# Patient Record
Sex: Male | Born: 1960 | Race: Black or African American | Hispanic: No | Marital: Single | Smoking: Current every day smoker
Health system: Southern US, Community
[De-identification: ages and names within clinical notes are randomized; demographics above are authoritative.]

## PROBLEM LIST (undated history)

## (undated) DIAGNOSIS — F209 Schizophrenia, unspecified: Secondary | ICD-10-CM

## (undated) DIAGNOSIS — F419 Anxiety disorder, unspecified: Secondary | ICD-10-CM

## (undated) DIAGNOSIS — B1920 Unspecified viral hepatitis C without hepatic coma: Secondary | ICD-10-CM

## (undated) DIAGNOSIS — R569 Unspecified convulsions: Secondary | ICD-10-CM

## (undated) DIAGNOSIS — I10 Essential (primary) hypertension: Secondary | ICD-10-CM

## (undated) HISTORY — PX: ABDOMINAL SURGERY: SHX537

---

## 2004-05-05 ENCOUNTER — Emergency Department (HOSPITAL_COMMUNITY): Admission: EM | Admit: 2004-05-05 | Discharge: 2004-05-05 | Payer: Self-pay | Admitting: Emergency Medicine

## 2004-07-31 ENCOUNTER — Emergency Department (HOSPITAL_COMMUNITY): Admission: EM | Admit: 2004-07-31 | Discharge: 2004-07-31 | Payer: Self-pay | Admitting: Unknown Physician Specialty

## 2008-10-30 ENCOUNTER — Emergency Department (HOSPITAL_COMMUNITY): Admission: EM | Admit: 2008-10-30 | Discharge: 2008-10-30 | Payer: Self-pay | Admitting: Emergency Medicine

## 2008-10-31 ENCOUNTER — Other Ambulatory Visit: Payer: Self-pay | Admitting: Emergency Medicine

## 2008-10-31 ENCOUNTER — Inpatient Hospital Stay (HOSPITAL_COMMUNITY): Admission: EM | Admit: 2008-10-31 | Discharge: 2008-11-02 | Payer: Self-pay | Admitting: Psychiatry

## 2008-10-31 ENCOUNTER — Ambulatory Visit: Payer: Self-pay | Admitting: Psychiatry

## 2008-11-10 ENCOUNTER — Emergency Department (HOSPITAL_COMMUNITY): Admission: EM | Admit: 2008-11-10 | Discharge: 2008-11-10 | Payer: Self-pay | Admitting: Emergency Medicine

## 2010-09-13 ENCOUNTER — Emergency Department (HOSPITAL_COMMUNITY)
Admission: EM | Admit: 2010-09-13 | Discharge: 2010-09-13 | Payer: Self-pay | Source: Home / Self Care | Admitting: Emergency Medicine

## 2011-01-07 LAB — CBC
HCT: 43.1 % (ref 39.0–52.0)
HCT: 44.2 % (ref 39.0–52.0)
Hemoglobin: 14.4 g/dL (ref 13.0–17.0)
Hemoglobin: 14.9 g/dL (ref 13.0–17.0)
MCHC: 33.5 g/dL (ref 30.0–36.0)
MCHC: 33.7 g/dL (ref 30.0–36.0)
MCV: 97.2 fL (ref 78.0–100.0)
MCV: 98.1 fL (ref 78.0–100.0)
Platelets: 295 10*3/uL (ref 150–400)
Platelets: 295 10*3/uL (ref 150–400)
RBC: 4.39 MIL/uL (ref 4.22–5.81)
RBC: 4.55 MIL/uL (ref 4.22–5.81)
RDW: 11.7 % (ref 11.5–15.5)
RDW: 12 % (ref 11.5–15.5)
WBC: 6.7 10*3/uL (ref 4.0–10.5)
WBC: 7.4 10*3/uL (ref 4.0–10.5)

## 2011-01-07 LAB — COMPREHENSIVE METABOLIC PANEL
ALT: 37 U/L (ref 0–53)
ALT: 39 U/L (ref 0–53)
AST: 48 U/L — ABNORMAL HIGH (ref 0–37)
AST: 48 U/L — ABNORMAL HIGH (ref 0–37)
Albumin: 3.4 g/dL — ABNORMAL LOW (ref 3.5–5.2)
Albumin: 3.6 g/dL (ref 3.5–5.2)
Alkaline Phosphatase: 66 U/L (ref 39–117)
Alkaline Phosphatase: 79 U/L (ref 39–117)
BUN: 12 mg/dL (ref 6–23)
BUN: 7 mg/dL (ref 6–23)
CO2: 26 mEq/L (ref 19–32)
CO2: 26 mEq/L (ref 19–32)
Calcium: 8.5 mg/dL (ref 8.4–10.5)
Calcium: 9 mg/dL (ref 8.4–10.5)
Chloride: 102 mEq/L (ref 96–112)
Chloride: 104 mEq/L (ref 96–112)
Creatinine, Ser: 0.97 mg/dL (ref 0.4–1.5)
Creatinine, Ser: 1.08 mg/dL (ref 0.4–1.5)
GFR calc Af Amer: >60 mL/min (ref >60)
GFR calc Af Amer: >60 mL/min (ref >60)
GFR calc non Af Amer: >60 mL/min (ref >60)
GFR calc non Af Amer: >60 mL/min (ref >60)
Glucose, Bld: 118 mg/dL — ABNORMAL HIGH (ref 70–99)
Glucose, Bld: 88 mg/dL (ref 70–99)
Potassium: 3.7 mEq/L (ref 3.5–5.1)
Potassium: 4 mEq/L (ref 3.5–5.1)
Sodium: 134 mEq/L — ABNORMAL LOW (ref 135–145)
Sodium: 139 mEq/L (ref 135–145)
Total Bilirubin: 1 mg/dL (ref 0.3–1.2)
Total Bilirubin: 1 mg/dL (ref 0.3–1.2)
Total Protein: 6.5 g/dL (ref 6.0–8.3)
Total Protein: 7 g/dL (ref 6.0–8.3)

## 2011-01-07 LAB — DIFFERENTIAL
Basophils Absolute: 0 10*3/uL (ref 0.0–0.1)
Basophils Absolute: 0 10*3/uL (ref 0.0–0.1)
Basophils Relative: 0 % (ref 0–1)
Basophils Relative: 1 % (ref 0–1)
Eosinophils Absolute: 0.1 10*3/uL (ref 0.0–0.7)
Eosinophils Absolute: 0.3 10*3/uL (ref 0.0–0.7)
Eosinophils Relative: 2 % (ref 0–5)
Eosinophils Relative: 4 % (ref 0–5)
Lymphocytes Relative: 40 % (ref 12–46)
Lymphocytes Relative: 46 % (ref 12–46)
Lymphs Abs: 2.7 10*3/uL (ref 0.7–4.0)
Lymphs Abs: 3.4 10*3/uL (ref 0.7–4.0)
Monocytes Absolute: 0.7 10*3/uL (ref 0.1–1.0)
Monocytes Absolute: 0.7 10*3/uL (ref 0.1–1.0)
Monocytes Relative: 10 % (ref 3–12)
Monocytes Relative: 11 % (ref 3–12)
Neutro Abs: 2.9 10*3/uL (ref 1.7–7.7)
Neutro Abs: 3.2 10*3/uL (ref 1.7–7.7)
Neutrophils Relative %: 40 % — ABNORMAL LOW (ref 43–77)
Neutrophils Relative %: 47 % (ref 43–77)

## 2011-01-07 LAB — LIPASE, BLOOD: Lipase: 50 U/L (ref 11–59)

## 2011-01-07 LAB — ETHANOL: Alcohol, Ethyl (B): <5 mg/dL (ref 0–10)

## 2011-01-07 LAB — GLUCOSE, CAPILLARY: Glucose-Capillary: 136 mg/dL — ABNORMAL HIGH (ref 70–99)

## 2011-03-05 ENCOUNTER — Emergency Department (HOSPITAL_COMMUNITY)
Admission: EM | Admit: 2011-03-05 | Discharge: 2011-03-05 | Discharge status: Against Medical Advice | Payer: Self-pay | Attending: Emergency Medicine | Admitting: Emergency Medicine

## 2011-03-05 DIAGNOSIS — M79609 Pain in unspecified limb: Secondary | ICD-10-CM | POA: Insufficient documentation

## 2011-03-05 DIAGNOSIS — G40909 Epilepsy, unspecified, not intractable, without status epilepticus: Secondary | ICD-10-CM | POA: Insufficient documentation

## 2011-03-05 DIAGNOSIS — Z8659 Personal history of other mental and behavioral disorders: Secondary | ICD-10-CM | POA: Insufficient documentation

## 2011-03-05 DIAGNOSIS — E119 Type 2 diabetes mellitus without complications: Secondary | ICD-10-CM | POA: Insufficient documentation

## 2011-03-05 DIAGNOSIS — I10 Essential (primary) hypertension: Secondary | ICD-10-CM | POA: Insufficient documentation

## 2011-07-17 ENCOUNTER — Emergency Department (HOSPITAL_COMMUNITY)
Admission: EM | Admit: 2011-07-17 | Discharge: 2011-07-17 | Discharge status: Against Medical Advice | Payer: Self-pay | Attending: Emergency Medicine | Admitting: Emergency Medicine

## 2011-07-17 DIAGNOSIS — Z008 Encounter for other general examination: Secondary | ICD-10-CM | POA: Insufficient documentation

## 2011-07-17 LAB — BASIC METABOLIC PANEL
BUN: 14 mg/dL (ref 6–23)
Chloride: 102 mEq/L (ref 96–112)
Creatinine, Ser: 0.91 mg/dL (ref 0.50–1.35)
GFR calc non Af Amer: >90 mL/min (ref >90)
Glucose, Bld: 116 mg/dL — ABNORMAL HIGH (ref 70–99)
Potassium: 3.5 mEq/L (ref 3.5–5.1)

## 2011-07-17 LAB — DIFFERENTIAL
Basophils Relative: 0 % (ref 0–1)
Eosinophils Absolute: 0.2 10*3/uL (ref 0.0–0.7)
Eosinophils Relative: 5 % (ref 0–5)
Lymphocytes Relative: 35 % (ref 12–46)
Lymphs Abs: 1.5 10*3/uL (ref 0.7–4.0)
Monocytes Relative: 15 % — ABNORMAL HIGH (ref 3–12)
Neutro Abs: 1.9 10*3/uL (ref 1.7–7.7)
Neutrophils Relative %: 45 % (ref 43–77)

## 2011-07-17 LAB — CBC
HCT: 34.9 % — ABNORMAL LOW (ref 39.0–52.0)
Hemoglobin: 11.3 g/dL — ABNORMAL LOW (ref 13.0–17.0)
MCH: 31.1 pg (ref 26.0–34.0)
MCV: 96.1 fL (ref 78.0–100.0)
RBC: 3.63 MIL/uL — ABNORMAL LOW (ref 4.22–5.81)
RDW: 14.3 % (ref 11.5–15.5)
WBC: 4.3 10*3/uL (ref 4.0–10.5)

## 2011-07-17 LAB — ETHANOL: Alcohol, Ethyl (B): <11 mg/dL (ref 0–11)

## 2011-11-18 ENCOUNTER — Emergency Department (HOSPITAL_COMMUNITY): Payer: Self-pay

## 2011-11-18 ENCOUNTER — Emergency Department (HOSPITAL_COMMUNITY)
Admission: EM | Admit: 2011-11-18 | Discharge: 2011-11-19 | Disposition: A | Discharge status: Home Health | Payer: Self-pay | Attending: Emergency Medicine | Admitting: Emergency Medicine

## 2011-11-18 ENCOUNTER — Encounter (HOSPITAL_COMMUNITY): Payer: Self-pay | Admitting: Emergency Medicine

## 2011-11-18 DIAGNOSIS — R10819 Abdominal tenderness, unspecified site: Secondary | ICD-10-CM | POA: Insufficient documentation

## 2011-11-18 DIAGNOSIS — F17200 Nicotine dependence, unspecified, uncomplicated: Secondary | ICD-10-CM | POA: Insufficient documentation

## 2011-11-18 DIAGNOSIS — Z5900 Homelessness unspecified: Secondary | ICD-10-CM | POA: Insufficient documentation

## 2011-11-18 DIAGNOSIS — R109 Unspecified abdominal pain: Secondary | ICD-10-CM | POA: Insufficient documentation

## 2011-11-18 LAB — CBC
HCT: 40.4 % (ref 39.0–52.0)
Hemoglobin: 14.3 g/dL (ref 13.0–17.0)
MCH: 32.2 pg (ref 26.0–34.0)
MCHC: 35.4 g/dL (ref 30.0–36.0)
MCV: 91 fL (ref 78.0–100.0)
Platelets: 213 10*3/uL (ref 150–400)
RBC: 4.44 MIL/uL (ref 4.22–5.81)
WBC: 9.3 10*3/uL (ref 4.0–10.5)

## 2011-11-18 LAB — TROPONIN I: Troponin I: <0.3 ng/mL (ref <0.30)

## 2011-11-18 LAB — COMPREHENSIVE METABOLIC PANEL
Albumin: 3.6 g/dL (ref 3.5–5.2)
Alkaline Phosphatase: 146 U/L — ABNORMAL HIGH (ref 39–117)
BUN: 16 mg/dL (ref 6–23)
CO2: 27 mEq/L (ref 19–32)
Calcium: 9.6 mg/dL (ref 8.4–10.5)
Chloride: 103 mEq/L (ref 96–112)
Creatinine, Ser: 1 mg/dL (ref 0.50–1.35)
GFR calc Af Amer: >90 mL/min (ref >90)
GFR calc non Af Amer: 85 mL/min — ABNORMAL LOW (ref >90)
Sodium: 142 mEq/L (ref 135–145)
Total Bilirubin: 0.6 mg/dL (ref 0.3–1.2)
Total Protein: 7.6 g/dL (ref 6.0–8.3)

## 2011-11-18 LAB — DIFFERENTIAL
Basophils Relative: 0 % (ref 0–1)
Eosinophils Absolute: 0.1 10*3/uL (ref 0.0–0.7)
Eosinophils Relative: 1 % (ref 0–5)
Monocytes Absolute: 1.1 10*3/uL — ABNORMAL HIGH (ref 0.1–1.0)
Monocytes Relative: 12 % (ref 3–12)
Neutro Abs: 5.7 10*3/uL (ref 1.7–7.7)
Neutrophils Relative %: 61 % (ref 43–77)

## 2011-11-18 LAB — LIPASE, BLOOD: Lipase: 36 U/L (ref 11–59)

## 2011-11-18 MED ORDER — BENZTROPINE MESYLATE 1 MG/ML IJ SOLN
1.0000 mg | INTRAMUSCULAR | Status: DC
  Filled 2011-11-18: qty 1

## 2011-11-18 NOTE — ED Notes (Signed)
Pt angry stated no one cares about him asked why he was upset pt replies he didn't want to talk to me . After repeated attempt to engage pt in conversation pt reported he wants ti leave and AMA form signed and pt departed

## 2011-11-18 NOTE — ED Notes (Signed)
After in eating pecan pie, pepsi, and a honey bun pt developed abd pain denies N/V

## 2011-11-18 NOTE — ED Provider Notes (Signed)
History     CSN: [id number]  Arrival date & time 11/18/11  0446   First MD Initiated Contact with Patient 11/18/11 0502      Chief Complaint  Patient presents with  . Abdominal Cramping   patient presents with abdominal pain since earlier this evening. He apparently has a psychiatric history and is also requesting his Cogentin shot. The pain. He states is mostly on the left side of his abdomen. He cannot describe the characteristics of the pain. Although it is nonradiating. This began after he was eating piton pipette sienna honey bun. He had a normal bowel movement today. He had no nausea, vomiting. He had no diarrhea or fever. He denies any chest pain. No specific alleviating or aggravating factors. No other associated symptoms. Denies any suicidal or homicidal thoughts. Patient denies any history of ingestion of any medications. He does admit to being homeless currently patient is a very poor historian and not very forthcoming with his history. He just states, "it hurts, man" patient is somewhat reluctant to be examined (Consider location/radiation/quality/duration/timing/severity/associated sxs/prior treatment) HPI  History reviewed. No pertinent past medical history.  History reviewed. No pertinent past surgical history.  No family history on file.  History  Substance Use Topics  . Smoking status: Current Everyday Smoker  . Smokeless tobacco: Not on file  . Alcohol Use: Yes      Review of Systems  All other systems reviewed and are negative.    Allergies  Review of patient's allergies indicates no known allergies.  Home Medications  No current outpatient prescriptions on file.  BP 175/120  Pulse 85  Temp(Src) 98.6 F (37 C) (Oral)  Resp 20  SpO2 100%  Physical Exam  Nursing note and vitals reviewed. Constitutional: He is oriented to person, place, and time. He appears well-developed and well-nourished.  HENT:  Head: Normocephalic and atraumatic.  Eyes:  Conjunctivae and EOM are normal. Pupils are equal, round, and reactive to light.  Neck: Neck supple.  Cardiovascular: Normal rate and regular rhythm.  Exam reveals no gallop and no friction rub.   No murmur heard. Pulmonary/Chest: Breath sounds normal. He has no wheezes. He has no rales. He exhibits no tenderness.  Abdominal: Soft. Bowel sounds are normal. He exhibits no distension. There is no rebound and no guarding.       Mild diffuse tenderness throughout the left abdomen. There is no right lower quadrant tenderness. No rebound, rigidity or guarding. Bowel sounds are present and normal.  Musculoskeletal: Normal range of motion.  Neurological: He is alert and oriented to person, place, and time. No cranial nerve deficit. Coordination normal.  Skin: Skin is warm and dry. No rash noted.  Psychiatric: He has a normal mood and affect.       Slightly strange affect. Question underlying schizophrenia or other psychiatric disorder    ED Course  Procedures (including critical care time)   Labs Reviewed  CBC  DIFFERENTIAL  COMPREHENSIVE METABOLIC PANEL  LIPASE, BLOOD  URINALYSIS, ROUTINE W REFLEX MICROSCOPIC  URINE RAPID DRUG SCREEN (HOSP PERFORMED)  URINALYSIS, ROUTINE W REFLEX MICROSCOPIC   No results found.   No diagnosis found.    MDM  Patient is seen and examined, initial history and physical is completed. Evaluation initiated   Followup John a workup has been ordered. Patient was also given his Cogentin shot as requested.  Patient left AMA w/o my knowledge   Odesser Tourangeau A. Patrica Duel, MD 11/18/11 2258

## 2012-02-26 ENCOUNTER — Emergency Department (HOSPITAL_COMMUNITY)
Admission: EM | Admit: 2012-02-26 | Discharge: 2012-02-27 | Disposition: A | Discharge status: Home / Self Care | Payer: Self-pay | Attending: Emergency Medicine | Admitting: Emergency Medicine

## 2012-02-26 ENCOUNTER — Encounter (HOSPITAL_COMMUNITY): Payer: Self-pay | Admitting: Emergency Medicine

## 2012-02-26 DIAGNOSIS — R42 Dizziness and giddiness: Secondary | ICD-10-CM | POA: Insufficient documentation

## 2012-02-26 DIAGNOSIS — F17200 Nicotine dependence, unspecified, uncomplicated: Secondary | ICD-10-CM | POA: Insufficient documentation

## 2012-02-26 DIAGNOSIS — Z760 Encounter for issue of repeat prescription: Secondary | ICD-10-CM | POA: Insufficient documentation

## 2012-02-26 NOTE — ED Notes (Signed)
PT. REPORTS " DIZZY SPELLS" TODAY , NAUSEA OR FEVER , AMBULATORY .

## 2012-02-27 MED ORDER — BENZTROPINE MESYLATE 1 MG/ML IJ SOLN
1.0000 mg | Freq: Once | INTRAMUSCULAR | Status: AC
  Administered 2012-02-27: 1 mg via INTRAMUSCULAR
  Filled 2012-02-27: qty 1

## 2012-02-27 NOTE — ED Provider Notes (Signed)
History     CSN: [id number]  Arrival date & time 02/26/12  2343   None     Chief Complaint  Patient presents with  . Dizziness    (Consider location/radiation/quality/duration/timing/severity/associated sxs/prior treatment) HPI Comments: Patient states he is out of his Cogentin because his check.  Has not arrived, yet it's due to arrive on the 15th, when he will be able to fill his prescription.  He does have numerous refills that are available to him  The history is provided by the patient.    History reviewed. No pertinent past medical history.  History reviewed. No pertinent past surgical history.  No family history on file.  History  Substance Use Topics  . Smoking status: Current Everyday Smoker  . Smokeless tobacco: Not on file  . Alcohol Use: Yes      Review of Systems  Constitutional: Negative for activity change.  Gastrointestinal: Negative for nausea.  Neurological: Positive for dizziness. Negative for weakness and headaches.    Allergies  Review of patient's allergies indicates no known allergies.  Home Medications  No current outpatient prescriptions on file.  BP 187/127  Pulse 70  Temp(Src) 98.5 F (36.9 C) (Oral)  Resp 18  SpO2 98%  Physical Exam  Constitutional: He appears well-developed and well-nourished.  HENT:  Head: Normocephalic.  Eyes: Pupils are equal, round, and reactive to light.  Neck: Normal range of motion.  Cardiovascular: Normal rate.   Pulmonary/Chest: Effort normal.  Musculoskeletal: Normal range of motion.  Neurological: He is alert.  Skin: Skin is warm. No rash noted. No pallor.    ED Course  Procedures (including critical care time)  Labs Reviewed - No data to display No results found.   1. Medication refill       MDM   Excellent patient, that I can only give him a dose of Cogentin in the emergency room and I have no samples for him.  He states he understands that he will get his prescription for  penicillin as his money.  Arrives        Arman Filter, NP 02/27/12 0057  Arman Filter, NP 02/27/12 [phone]

## 2012-02-27 NOTE — ED Notes (Signed)
The pt says he thinks he has a sinus infection .  Pain across his forehead and congested

## 2012-02-27 NOTE — ED Provider Notes (Signed)
Medical screening examination/treatment/procedure(s) were performed by non-physician practitioner and as supervising physician I was immediately available for consultation/collaboration.   Lyanne Co, MD 02/27/12 0110

## 2012-02-27 NOTE — Discharge Instructions (Signed)
Fill your prescription as soon as possible

## 2015-03-01 ENCOUNTER — Encounter: Payer: Self-pay | Admitting: Emergency Medicine

## 2015-03-01 ENCOUNTER — Inpatient Hospital Stay
Admission: EM | Admit: 2015-03-01 | Discharge: 2015-03-03 | DRG: 871 | Disposition: A | Discharge status: Home / Self Care | Payer: Medicare Other | Attending: Internal Medicine | Admitting: Internal Medicine

## 2015-03-01 ENCOUNTER — Emergency Department: Payer: Medicare Other

## 2015-03-01 DIAGNOSIS — F17210 Nicotine dependence, cigarettes, uncomplicated: Secondary | ICD-10-CM | POA: Diagnosis present

## 2015-03-01 DIAGNOSIS — F411 Generalized anxiety disorder: Secondary | ICD-10-CM | POA: Diagnosis present

## 2015-03-01 DIAGNOSIS — G40909 Epilepsy, unspecified, not intractable, without status epilepticus: Secondary | ICD-10-CM | POA: Diagnosis present

## 2015-03-01 DIAGNOSIS — N12 Tubulo-interstitial nephritis, not specified as acute or chronic: Secondary | ICD-10-CM

## 2015-03-01 DIAGNOSIS — I10 Essential (primary) hypertension: Secondary | ICD-10-CM | POA: Diagnosis present

## 2015-03-01 DIAGNOSIS — N10 Acute tubulo-interstitial nephritis: Secondary | ICD-10-CM | POA: Diagnosis present

## 2015-03-01 DIAGNOSIS — A419 Sepsis, unspecified organism: Principal | ICD-10-CM

## 2015-03-01 DIAGNOSIS — E876 Hypokalemia: Secondary | ICD-10-CM | POA: Diagnosis present

## 2015-03-01 DIAGNOSIS — N170 Acute kidney failure with tubular necrosis: Secondary | ICD-10-CM | POA: Diagnosis present

## 2015-03-01 DIAGNOSIS — F70 Mild intellectual disabilities: Secondary | ICD-10-CM | POA: Diagnosis present

## 2015-03-01 DIAGNOSIS — N179 Acute kidney failure, unspecified: Secondary | ICD-10-CM

## 2015-03-01 DIAGNOSIS — E785 Hyperlipidemia, unspecified: Secondary | ICD-10-CM | POA: Diagnosis present

## 2015-03-01 HISTORY — DX: Unspecified convulsions: R56.9

## 2015-03-01 HISTORY — DX: Anxiety disorder, unspecified: F41.9

## 2015-03-01 HISTORY — DX: Essential (primary) hypertension: I10

## 2015-03-01 LAB — CBC WITH DIFFERENTIAL/PLATELET
BASOS PCT: 0 %
Basophils Absolute: 0 10*3/uL (ref 0–0.1)
Eosinophils Absolute: 0 10*3/uL (ref 0–0.7)
Eosinophils Relative: 0 %
HEMATOCRIT: 34.8 % — AB (ref 40.0–52.0)
HEMOGLOBIN: 12 g/dL — AB (ref 13.0–18.0)
Lymphocytes Relative: 10 %
Lymphs Abs: 1.5 10*3/uL (ref 1.0–3.6)
MCH: 32.5 pg (ref 26.0–34.0)
MCHC: 34.5 g/dL (ref 32.0–36.0)
MCV: 94.1 fL (ref 80.0–100.0)
MONO ABS: 3.1 10*3/uL — AB (ref 0.2–1.0)
Monocytes Relative: 21 %
NEUTROS ABS: 10 10*3/uL — AB (ref 1.4–6.5)
Neutrophils Relative %: 69 %
PLATELETS: 177 10*3/uL (ref 150–440)
RBC: 3.69 MIL/uL — AB (ref 4.40–5.90)
RDW: 12 % (ref 11.5–14.5)
WBC: 14.6 10*3/uL — AB (ref 3.8–10.6)

## 2015-03-01 LAB — URINALYSIS COMPLETE WITH MICROSCOPIC (ARMC ONLY)
Bilirubin Urine: NEGATIVE
GLUCOSE, UA: NEGATIVE mg/dL
KETONES UR: NEGATIVE mg/dL
Nitrite: NEGATIVE
PROTEIN: NEGATIVE mg/dL
Specific Gravity, Urine: 1.006 (ref 1.005–1.030)
pH: 6 (ref 5.0–8.0)

## 2015-03-01 LAB — COMPREHENSIVE METABOLIC PANEL
ALBUMIN: 3 g/dL — AB (ref 3.5–5.0)
ALK PHOS: 60 U/L (ref 38–126)
ALT: 23 U/L (ref 17–63)
ANION GAP: 11 (ref 5–15)
AST: 41 U/L (ref 15–41)
BUN: 33 mg/dL — ABNORMAL HIGH (ref 6–20)
CHLORIDE: 98 mmol/L — AB (ref 101–111)
CO2: 23 mmol/L (ref 22–32)
Calcium: 8.3 mg/dL — ABNORMAL LOW (ref 8.9–10.3)
Creatinine, Ser: 2.97 mg/dL — ABNORMAL HIGH (ref 0.61–1.24)
GFR calc Af Amer: 26 mL/min — ABNORMAL LOW (ref >60)
GFR calc non Af Amer: 22 mL/min — ABNORMAL LOW (ref >60)
GLUCOSE: 132 mg/dL — AB (ref 65–99)
Potassium: 2.9 mmol/L — ABNORMAL LOW (ref 3.5–5.1)
SODIUM: 132 mmol/L — AB (ref 135–145)
TOTAL PROTEIN: 7.2 g/dL (ref 6.5–8.1)
Total Bilirubin: 2.1 mg/dL — ABNORMAL HIGH (ref 0.3–1.2)

## 2015-03-01 LAB — TROPONIN I: Troponin I: <0.03 ng/mL (ref <0.031)

## 2015-03-01 LAB — PROTIME-INR
INR: 1.1
Prothrombin Time: 14.4 seconds (ref 11.4–15.0)

## 2015-03-01 LAB — APTT: APTT: 35 s (ref 24–36)

## 2015-03-01 MED ORDER — QUETIAPINE FUMARATE 25 MG PO TABS: 100.0000 mg | ORAL_TABLET | Freq: Two times a day (BID) | ORAL | Status: DC

## 2015-03-01 MED ORDER — HALOPERIDOL 1 MG PO TABS: 5.0000 mg | ORAL_TABLET | Freq: Three times a day (TID) | ORAL | Status: DC | PRN

## 2015-03-01 MED ORDER — ACETAMINOPHEN 650 MG RE SUPP: 650.0000 mg | Freq: Four times a day (QID) | RECTAL | Status: DC | PRN

## 2015-03-01 MED ORDER — HALOPERIDOL 5 MG PO TABS
10.0000 mg | ORAL_TABLET | Freq: Two times a day (BID) | ORAL | Status: DC
  Administered 2015-03-02 – 2015-03-03 (×4): 10 mg via ORAL
  Filled 2015-03-01: qty 2
  Filled 2015-03-01: qty 10
  Filled 2015-03-01: qty 2
  Filled 2015-03-01: qty 10
  Filled 2015-03-01: qty 2

## 2015-03-01 MED ORDER — ASPIRIN EC 81 MG PO TBEC
81.0000 mg | DELAYED_RELEASE_TABLET | Freq: Every day | ORAL | Status: DC
Start: 2015-03-02 — End: 2015-03-03
  Administered 2015-03-02 – 2015-03-03 (×2): 81 mg via ORAL
  Filled 2015-03-01 (×2): qty 1

## 2015-03-01 MED ORDER — SODIUM CHLORIDE 0.9 % IV SOLN
INTRAVENOUS | Status: AC
  Administered 2015-03-02: via INTRAVENOUS

## 2015-03-01 MED ORDER — VANCOMYCIN HCL IN DEXTROSE 1-5 GM/200ML-% IV SOLN
INTRAVENOUS | Status: AC
  Filled 2015-03-01: qty 200

## 2015-03-01 MED ORDER — IOHEXOL 240 MG/ML SOLN
50.0000 mL | Freq: Once | INTRAMUSCULAR | Status: AC | PRN
  Administered 2015-03-01: 50 mL via ORAL

## 2015-03-01 MED ORDER — AMLODIPINE BESYLATE 10 MG PO TABS
10.0000 mg | ORAL_TABLET | Freq: Every morning | ORAL | Status: DC
  Administered 2015-03-02 – 2015-03-03 (×2): 10 mg via ORAL
  Filled 2015-03-01 (×2): qty 1

## 2015-03-01 MED ORDER — HEPARIN SODIUM (PORCINE) 5000 UNIT/ML IJ SOLN
5000.0000 [IU] | Freq: Three times a day (TID) | INTRAMUSCULAR | Status: DC
  Administered 2015-03-02 – 2015-03-03 (×4): 5000 [IU] via SUBCUTANEOUS
  Filled 2015-03-01 (×4): qty 1

## 2015-03-01 MED ORDER — ACETAMINOPHEN 500 MG PO TABS
ORAL_TABLET | ORAL | Status: AC
  Filled 2015-03-01: qty 2

## 2015-03-01 MED ORDER — SODIUM CHLORIDE 0.9 % IV BOLUS (SEPSIS): 1000.0000 mL | Freq: Once | INTRAVENOUS | Status: DC

## 2015-03-01 MED ORDER — VANCOMYCIN HCL IN DEXTROSE 1-5 GM/200ML-% IV SOLN
1000.0000 mg | Freq: Once | INTRAVENOUS | Status: AC
  Administered 2015-03-01: 1000 mg via INTRAVENOUS

## 2015-03-01 MED ORDER — IOHEXOL 240 MG/ML SOLN
25.0000 mL | Freq: Once | INTRAMUSCULAR | Status: AC | PRN
  Administered 2015-03-01: 50 mL via INTRAVENOUS

## 2015-03-01 MED ORDER — QUETIAPINE FUMARATE 25 MG PO TABS
200.0000 mg | ORAL_TABLET | Freq: Every morning | ORAL | Status: DC
  Administered 2015-03-02 – 2015-03-03 (×2): 200 mg via ORAL
  Filled 2015-03-01 (×2): qty 8

## 2015-03-01 MED ORDER — PIPERACILLIN-TAZOBACTAM 3.375 G IVPB 30 MIN
3.3750 g | Freq: Once | INTRAVENOUS | Status: AC
Start: 2015-03-01 — End: 2015-03-01
  Administered 2015-03-01: 3.375 g via INTRAVENOUS

## 2015-03-01 MED ORDER — ACETAMINOPHEN 500 MG PO TABS
1000.0000 mg | ORAL_TABLET | Freq: Once | ORAL | Status: AC
  Administered 2015-03-01: 1000 mg via ORAL

## 2015-03-01 MED ORDER — PIPERACILLIN-TAZOBACTAM 3.375 G IVPB
INTRAVENOUS | Status: AC
  Filled 2015-03-01: qty 50

## 2015-03-01 MED ORDER — QUETIAPINE FUMARATE 300 MG PO TABS
300.0000 mg | ORAL_TABLET | Freq: Every day | ORAL | Status: DC
  Administered 2015-03-02 (×2): 300 mg via ORAL
  Filled 2015-03-01 (×2): qty 1

## 2015-03-01 MED ORDER — POTASSIUM CHLORIDE CRYS ER 20 MEQ PO TBCR
30.0000 meq | EXTENDED_RELEASE_TABLET | Freq: Once | ORAL | Status: AC
  Administered 2015-03-02: 30 meq via ORAL
  Filled 2015-03-01: qty 1
  Filled 2015-03-01: qty 1.5

## 2015-03-01 MED ORDER — PIPERACILLIN-TAZOBACTAM 3.375 G IVPB
3.3750 g | Freq: Three times a day (TID) | INTRAVENOUS | Status: DC
Start: 2015-03-02 — End: 2015-03-03
  Administered 2015-03-02 – 2015-03-03 (×4): 3.375 g via INTRAVENOUS
  Filled 2015-03-01 (×8): qty 50

## 2015-03-01 MED ORDER — SODIUM CHLORIDE 0.9 % IV BOLUS (SEPSIS)
1850.0000 mL | Freq: Once | INTRAVENOUS | Status: AC
  Administered 2015-03-01: 1850 mL via INTRAVENOUS

## 2015-03-01 MED ORDER — QUETIAPINE FUMARATE 25 MG PO TABS
100.0000 mg | ORAL_TABLET | Freq: Every evening | ORAL | Status: DC
  Administered 2015-03-02: 100 mg via ORAL
  Filled 2015-03-01: qty 4

## 2015-03-01 MED ORDER — ACETAMINOPHEN 325 MG PO TABS: 650.0000 mg | ORAL_TABLET | Freq: Four times a day (QID) | ORAL | Status: DC | PRN

## 2015-03-01 MED ORDER — BENZTROPINE MESYLATE 1 MG PO TABS
1.0000 mg | ORAL_TABLET | Freq: Two times a day (BID) | ORAL | Status: DC
  Administered 2015-03-02 – 2015-03-03 (×4): 1 mg via ORAL
  Filled 2015-03-01 (×4): qty 1

## 2015-03-01 MED ORDER — IOHEXOL 240 MG/ML SOLN
25.0000 mL | Freq: Once | INTRAMUSCULAR | Status: AC | PRN
  Administered 2015-03-01: 25 mL via ORAL

## 2015-03-01 NOTE — ED Notes (Signed)
2nd liter of NS started at 1935. Infusion pump set to deliver , giving the pt a total of as ordered per code sepsis protocol.

## 2015-03-01 NOTE — H&P (Signed)
[facility] [place] at [facility]   PATIENT NAME: Roberto Vasquez    MR#:  [id number]  DATE OF BIRTH:  [birth year]  DATE OF ADMISSION:  03/01/2015  PRIMARY CARE PHYSICIAN: No primary care provider on file.   REQUESTING/REFERRING PHYSICIAN: Chari Manning  CHIEF COMPLAINT:   Chief Complaint  Patient presents with  . Weakness  . Dizziness  . Hernia   patient brought in by his caregiver from group home with the complaints of generalized weakness, lower abdominal discomfort and not eating well for the past few days.  HISTORY OF PRESENT ILLNESS:  Roberto Vasquez  is a 54 y.o. male with a known history of hypertension, seizure disorder, anxiety disorder, mental retardation, a resident of group home brought in by his caregiver with the complaints of generalized weakness, not eating well for the past few days and lower abdominal discomfort. Patient is a poor historian and is not able to give an accurate history and patient's caregiver is not available at this time. The history I got is from the ED physician's note. No history of any fever, shortness of breath, cough, chest pain, nausea, vomiting, diarrhea. Patient is alert awake and oriented 2, comfortably resting in the bed, denies any complaints except for mild lower abdominal discomfort. Patient was evaluated by the ED physician and was noted to have elevated white blood cell count of 14.6, urinalysis unremarkable for significant UTI and BMP with potassium of 2.9,BUN 33 and creatinine 2.9. Chest x-ray negative for any acute cardiopulmonary pathology. CT of the abdomen the pelvis significant for inflammatory changes suggestive of left pyelonephritis and cystitis. After obtaining blood and urine cultures, patient was started on IV antibiotics-vancomycin and Zosyn by the ED physician. Patient also received IV fluids normal saline bolus 1 and currently  his blood pressure is maintaining within normal limits. Hospitalist service was  consulted for further management.  PAST MEDICAL HISTORY:   Past Medical History  Diagnosis Date  . Anxiety   . Hypertension   . Seizures     PAST SURGICAL HISTORY:  History reviewed. No pertinent past surgical history.  SOCIAL HISTORY:   History  Substance Use Topics  . Smoking status: Current Every Day Smoker  . Smokeless tobacco: Not on file  . Alcohol Use: Yes    FAMILY HISTORY:  History reviewed. No pertinent family history.  DRUG ALLERGIES:  No Known Allergies  REVIEW OF SYSTEMS:   Review of Systems  Unable to perform ROS: mental acuity  Constitutional: Negative for fever and chills.   patient denies any complaints except for just mild lower abdominal discomfort  MEDICATIONS AT HOME:   Prior to Admission medications   Medication Sig Start Date End Date Taking? Authorizing Provider  amLODipine (NORVASC) 10 MG tablet Take 10 mg by mouth every morning.   Yes Historical Provider, MD  benztropine (COGENTIN) 1 MG tablet Take 1 mg by mouth 2 (two) times daily.   Yes Historical Provider, MD  haloperidol (HALDOL) 10 MG tablet Take 10 mg by mouth 2 (two) times daily.   Yes Historical Provider, MD  haloperidol (HALDOL) 5 MG tablet Take 5 mg by mouth as needed (for paranoia).   Yes Historical Provider, MD  haloperidol decanoate (HALDOL DECANOATE) 100 MG/ML injection Inject 200 mg into the muscle every 30 (thirty) days.   Yes Historical Provider, MD  hydrochlorothiazide (HYDRODIURIL) 12.5 MG tablet Take 12.5 mg by mouth every morning.   Yes Historical Provider, MD  lisinopril (PRINIVIL,ZESTRIL) 20 MG tablet Take  20 mg by mouth every morning.   Yes Historical Provider, MD  QUEtiapine (SEROQUEL) 100 MG tablet Take 100-200 mg by mouth 2 (two) times daily. Pt takes two tablets in the morning and one tablet in the evening.   Yes Historical Provider, MD  QUEtiapine (SEROQUEL) 300 MG tablet Take 300 mg by mouth at bedtime.   Yes Historical Provider, MD      VITAL SIGNS:  Blood  pressure 112/75, pulse 99, temperature 99 F (37.2 C), temperature source Oral, resp. rate 24, height  (1.93 m), weight 95.5 kg (210 lb 8.6 oz), SpO2 97 %.  PHYSICAL EXAMINATION:  Physical Exam  Constitutional: He is oriented to person, place, and time. He appears well-developed and well-nourished. No distress.  HENT:  Head: Normocephalic and atraumatic.  Right Ear: External ear normal.  Left Ear: External ear normal.  Nose: Nose normal.  Mouth/Throat: Oropharynx is clear and moist. No oropharyngeal exudate.  Eyes: EOM are normal. Pupils are equal, round, and reactive to light. No scleral icterus.  Neck: Normal range of motion. Neck supple. No JVD present. No thyromegaly present.  Cardiovascular: Normal rate, regular rhythm, normal heart sounds and intact distal pulses.  Exam reveals no friction rub.   No murmur heard. Respiratory: Effort normal and breath sounds normal. No respiratory distress. He has no wheezes. He has no rales. He exhibits no tenderness.  GI: Soft. Bowel sounds are normal. He exhibits no distension and no mass. There is no tenderness. There is no rebound and no guarding.  Musculoskeletal: Normal range of motion. He exhibits no edema.  Lymphadenopathy:    He has no cervical adenopathy.  Neurological: He is alert and oriented to person, place, and time. He displays normal reflexes. No cranial nerve deficit. He exhibits normal muscle tone.  Skin: Skin is warm. No rash noted. No erythema.  Psychiatric: He has a normal mood and affect. His behavior is normal. Thought content normal.   LABORATORY PANEL:   CBC  Recent Labs Lab 03/01/15 1800  WBC 14.6*  HGB 12.0*  HCT 34.8*  PLT 177   ------------------------------------------------------------------------------------------------------------------  Chemistries   Recent Labs Lab 03/01/15 1800  NA 132*  K 2.9*  CL 98*  CO2 23  GLUCOSE 132*  BUN 33*  CREATININE 2.97*  CALCIUM 8.3*  AST 41  ALT 23   ALKPHOS 60  BILITOT 2.1*   ------------------------------------------------------------------------------------------------------------------  Cardiac Enzymes  Recent Labs Lab 03/01/15 1800  TROPONINI <0.03   ------------------------------------------------------------------------------------------------------------------  RADIOLOGY:  Ct Abdomen Pelvis Wo Contrast  03/01/2015   CLINICAL DATA:  Abdominal pain. Initial encounter. Weakness and dizziness for several days. Abdominal swelling.  EXAM: CT ABDOMEN AND PELVIS WITHOUT CONTRAST  TECHNIQUE: Multidetector CT imaging of the abdomen and pelvis was performed following the standard protocol without IV contrast.  COMPARISON:  None.  FINDINGS: Musculoskeletal: L5-S1 predominant lumbar spondylosis. No aggressive osseous lesions. Mild subchondral sclerosis in the femoral heads bilaterally without collapse is most compatible with degenerative eburnation.  Lung Bases: Atelectasis.  Scarring in the RIGHT middle lobe.  Liver:  Normal.  Spleen:  Normal.  Gallbladder:  Normal.  Common bile duct:  Normal.  Pancreas:  Normal.  Adrenal glands:  Normal.  Kidneys: LEFT renal enlargement and perinephric stranding. The LEFT ureter is also mildly dilated extending into the anatomic pelvis. No radiopaque calculus is present. Periureteric stranding is present on the LEFT. LEFT UVJ appears normal. The RIGHT kidney and ureter are within normal limits aside from a hyperdense cyst in the  interpolar region.  Stomach:  Within normal limits.  Small bowel:  No inflammatory changes or obstruction.  Colon: Large stool burden. Relative decompression of the sigmoid colon.  Pelvic Genitourinary: Mild thickening of the urinary bladder for the degree of distension. This may be secondary to bladder outflow obstruction or cystitis.  Peritoneum: No free air.  No free fluid.  Vascular/lymphatic: Atherosclerosis without an acute vascular abnormality accounting for noncontrast technique.  Reactive lymph nodes are present in the LEFT retroperitoneum associated with LEFT renal inflammatory changes.  Body Wall: Fat containing periumbilical and supraumbilical hernias.  IMPRESSION: 1. Inflammatory changes of the LEFT kidney with ectasia of the LEFT ureter. No calculi. Findings are cyst crash that findings are suggestive of an ascending urinary tract infection. 2. Thickening of the urinary bladder wall for the degree of distension, also compatible with infection and cystitis. 3. Fat containing periumbilical and supraumbilical ventral hernias.   Electronically Signed   By: Andreas Newport M.D.   On: 03/01/2015 20:30   Dg Chest Port 1 View  03/01/2015   CLINICAL DATA:  Dizziness and weakness for several days.  EXAM: PORTABLE CHEST - 1 VIEW  COMPARISON:  None.  FINDINGS: The heart size and mediastinal contours are within normal limits. Low lung volumes are noted. Linear opacity in both lung bases may be due to atelectasis or scarring. No evidence of pulmonary consolidation or edema. No evidence of pneumothorax or pleural effusion.  IMPRESSION: Low lung volumes with bibasilar atelectasis versus scarring.   Electronically Signed   By: Myles Rosenthal M.D.   On: 03/01/2015 18:37    EKG:   Orders placed or performed during the hospital encounter of 11/18/11  . EKG 12-Lead  . EKG 12-Lead    IMPRESSION AND PLAN:   1. Pyelonephritis. Plan: Admit, continue IV antibiotics-Zosyn, follow-up urine and blood cultures. 2. Hypokalemia, likely secondary to poor oral intake. Plan: K supplementation, follow-up BMP. 3. Renal insufficiency-BUN 33, creatinine 2.9. Baseline not known. Acute versus chronic. Plan: Gentle IV hydration, avoid nephrotoxic agents, hold off diuretics and ace inhibitors for now, follow-up BMP. Consider nephrology consultation if no improvement. 4. Hypertension, stable. Hold off lisinopril and HCTZ for now because of renal insufficiency. Continue amlodipine, follow BP monitoring.  5. History  of generalized anxiety disorder, stable on home medications. Continue same. 6. History of seizure disorder, patient stable. Monitor.    All the records are reviewed and case discussed with ED provider. Management plans discussed with the patient, family and they are in agreement.  CODE STATUS: Full code  TOTAL TIME TAKING CARE OF THIS PATIENT: 50 minutes.    Jonnie Kind N M.D on 03/01/2015 at 10:08 PM  Between 7am to 6pm - Pager - [phone]  After 6pm go to [email or web address] - password [email or web address] [facility]  [facility]  Office  [phone]  CC: Primary care physician; No primary care provider on file.

## 2015-03-01 NOTE — ED Provider Notes (Signed)
[facility] Emergency Department Provider Note  ____________________________________________  Time seen: Approximately 5:52 PM  I have reviewed the triage vital signs and the nursing notes.   HISTORY  Chief Complaint Weakness; Dizziness; and Hernia  Caveat-history of present illness and review of systems limited second or to the patient's mental retardation. Review of systems and history of present illness are obtained from Candie Echevaria, his caregiver.  HPI Roberto Vasquez is a 54 y.o. male with history of mental retardation, hyperlipidemia, hypertension, anxiety and seizures presents for evaluation of 2 days of left-sided abdominal pain. His caregiver, Ms. Watlington reports that he has been complaining of left abdominal pain, not eating well for the past 2 days. He has not had any vomiting or diarrhea to her knowledge. Today he was clutching his stomach while walking. He is unable to give any additional details about the nature of his symptoms. He does report that he has had painful urination. No modifying factors. Any vomiting or diarrhea, no chest pain or difficulty breathing.   Past Medical History  Diagnosis Date  . Anxiety   . Hypertension   . Seizures     There are no active problems to display for this patient.   History reviewed. No pertinent past surgical history.  Current Outpatient Rx  Name  Route  Sig  Dispense  Refill  . amLODipine (NORVASC) 10 MG tablet   Oral   Take 10 mg by mouth every morning.         . benztropine (COGENTIN) 1 MG tablet   Oral   Take 1 mg by mouth 2 (two) times daily.         . haloperidol (HALDOL) 10 MG tablet   Oral   Take 10 mg by mouth 2 (two) times daily.         . haloperidol (HALDOL) 5 MG tablet   Oral   Take 5 mg by mouth as needed (for paranoia).         . haloperidol decanoate (HALDOL DECANOATE) 100 MG/ML injection   Intramuscular   Inject 200 mg into the muscle every 30 (thirty)  days.         . hydrochlorothiazide (HYDRODIURIL) 12.5 MG tablet   Oral   Take 12.5 mg by mouth every morning.         Marland Kitchen lisinopril (PRINIVIL,ZESTRIL) 20 MG tablet   Oral   Take 20 mg by mouth every morning.         Marland Kitchen QUEtiapine (SEROQUEL) 100 MG tablet   Oral   Take 100-200 mg by mouth 2 (two) times daily. Pt takes two tablets in the morning and one tablet in the evening.         Marland Kitchen QUEtiapine (SEROQUEL) 300 MG tablet   Oral   Take 300 mg by mouth at bedtime.           Allergies Review of patient's allergies indicates no known allergies.  History reviewed. No pertinent family history.  Social History History  Substance Use Topics  . Smoking status: Current Every Day Smoker  . Smokeless tobacco: Not on file  . Alcohol Use: Yes    Review of Systems history of present illness and review of systems limited second or to the patient's mental retardation. Review of systems and history of present illness are obtained from [facility], his caregiver and partly from the patient as he is able. Constitutional: No fever/chills Cardiovascular: Denies chest pain. Respiratory: Denies shortness of breath. Gastrointestinal: +  abdominal pain.  No nausea, no vomiting.  No diarrhea.  No constipation. Genitourinary: Negative for dysuria.    ____________________________________________   PHYSICAL EXAM:  VITAL SIGNS: ED Triage Vitals  Enc Vitals Group     BP 03/01/15 1726 90/54 mmHg     Pulse Rate 03/01/15 1726 122     Resp 03/01/15 1726 20     Temp 03/01/15 1726 98.3 F (36.8 C)     Temp Source 03/01/15 1726 Oral     SpO2 03/01/15 1726 97 %     Weight 03/01/15 1726 180 lb (81.647 kg)     Height 03/01/15 1726 6\' 4"  (1.93 m)     Head Cir --      Peak Flow --      Pain Score --      Pain Loc --      Pain Edu? --      Excl. in GC? --     Constitutional: Alert and oriented x3. Appears developmentally delayed. Well appearing and in no acute distress. Eyes:  Conjunctivae are normal. PERRL. EOMI. Head: Atraumatic. Nose: No congestion/rhinnorhea. Mouth/Throat: Mucous membranes are dry.  Oropharynx non-erythematous. Neck: No stridor.   Cardiovascular: Tachycardic rate, regular rhythm. Grossly normal heart sounds.  Good peripheral circulation. Respiratory: Normal respiratory effort.  No retractions. Lungs CTAB. Gastrointestinal: Soft with mild diffuse tenderness. No distention. No abdominal bruits. No CVA tenderness. Genitourinary: Deferred Musculoskeletal: No lower extremity tenderness nor edema.  No joint effusions. Neurologic:  Normal speech and language. No gross focal neurologic deficits are appreciated. Speech is normal. No gait instability. Skin:  Skin is warm, dry and intact. No rash noted. Psychiatric: Mood and affect are normal. Speech and behavior are normal.  ____________________________________________   LABS (all labs ordered are listed, but only abnormal results are displayed)  Labs Reviewed  CBC WITH DIFFERENTIAL/PLATELET - Abnormal; Notable for the following:    WBC 14.6 (*)    RBC 3.69 (*)    Hemoglobin 12.0 (*)    HCT 34.8 (*)    Neutro Abs 10.0 (*)    Monocytes Absolute 3.1 (*)    All other components within normal limits  COMPREHENSIVE METABOLIC PANEL - Abnormal; Notable for the following:    Sodium 132 (*)    Potassium 2.9 (*)    Chloride 98 (*)    Glucose, Bld 132 (*)    BUN 33 (*)    Creatinine, Ser 2.97 (*)    Calcium 8.3 (*)    Albumin 3.0 (*)    Total Bilirubin 2.1 (*)    GFR calc non Af Amer 22 (*)    GFR calc Af Amer 26 (*)    All other components within normal limits  URINALYSIS COMPLETEWITH MICROSCOPIC ([facility] ONLY) - Abnormal; Notable for the following:    Color, Urine YELLOW (*)    APPearance CLEAR (*)    Hgb urine dipstick 2+ (*)    Leukocytes, UA 1+ (*)    Bacteria, UA RARE (*)    Squamous Epithelial / LPF 0-5 (*)    All other components within normal limits  CULTURE, BLOOD (ROUTINE X 2)   CULTURE, BLOOD (ROUTINE X 2)  URINE CULTURE  TROPONIN I  APTT  PROTIME-INR   ____________________________________________  EKG  ED ECG REPORT I, Gayla Doss, the attending physician, personally viewed and interpreted this ECG.   Date: 03/01/2015  EKG Time: 19:39  Rate: 115  Rhythm: sinus tachycardia  Axis: Normal  Intervals:none  ST&T Change: No acute  ST segment change  ____________________________________________  RADIOLOGY  CT abdomen and pelvis IMPRESSION: 1. Inflammatory changes of the LEFT kidney with ectasia of the LEFT ureter. No calculi. Findings are cyst crash that findings are suggestive of an ascending urinary tract infection. 2. Thickening of the urinary bladder wall for the degree of distension, also compatible with infection and cystitis. 3. Fat containing periumbilical and supraumbilical ventral hernias.   CXR IMPRESSION: Low lung volumes with bibasilar atelectasis versus scarring. ____________________________________________   PROCEDURES  Procedure(s) performed: None  Critical Care performed: Yes, see critical care note(s). Total critical care time spent 45 minutes.  ____________________________________________   INITIAL IMPRESSION / ASSESSMENT AND PLAN / ED COURSE  Pertinent labs & imaging results that were available during my care of the patient were reviewed by me and considered in my medical decision making (see chart for details).  BYREN PANKOW is a 54 y.o. male with history of mental retardation, hyperlipidemia, hypertension, anxiety and seizures presents for evaluation of 2 days of left-sided abdominal pain. On arrival to the emergency department, he is febrile with a  temperature of 103, tachycardic meeting 2 out of 4 Sirs criteria. Plan for liberal IV normal saline, vancomycin, Zosyn, labs, blood cultures, urine cultures and likely CT of the abdomen and pelvis.  ----------------------------------------- 9:09 PM on  03/01/2015 ----------------------------------------- Labs notable for leukocytosis, mild hyponatremia, hypokalemia, acute renal failure. We'll replete potassium. Continue IV fluids. Urinalysis concerning for urinary tract infection. CT of the abdomen and pelvis consistent with likely a ascending urinary tract infection. Discussed with hospitalist for admission. Patient remains hemodynamically stable.  ____________________________________________   FINAL CLINICAL IMPRESSION(S) / ED DIAGNOSES  Final diagnoses:  Sepsis, due to unspecified organism  Pyelonephritis  Acute renal failure, unspecified acute renal failure type  Hypokalemia      Gayla Doss, MD 03/01/15 2112

## 2015-03-01 NOTE — Progress Notes (Signed)
ANTIBIOTIC CONSULT NOTE - INITIAL  Pharmacy Consult for Zosyn Indication: Pyelonephritis  No Known Allergies  Patient Measurements: Height: 6\' 4"  (193 cm) Weight: 160 lb (72.576 kg) IBW/kg (Calculated) : 86.8   Vital Signs: Temp: 97.9 F (36.6 C) (06/09 2305) Temp Source: Oral (06/09 2305) BP: 105/70 mmHg (06/09 2305) Pulse Rate: 105 (06/09 2305) Intake/Output from previous day:   Intake/Output from this shift:    Labs:  Recent Labs  03/01/15 1800  WBC 14.6*  HGB 12.0*  PLT 177  CREATININE 2.97*   Estimated Creatinine Clearance: 28.5 mL/min (by C-G formula based on Cr of 2.97). No results for input(s): VANCOTROUGH, VANCOPEAK, VANCORANDOM, GENTTROUGH, GENTPEAK, GENTRANDOM, TOBRATROUGH, TOBRAPEAK, TOBRARND, AMIKACINPEAK, AMIKACINTROU, AMIKACIN in the last 72 hours.   Microbiology: No results found for this or any previous visit (from the past 720 hour(s)).  Medical History: Past Medical History  Diagnosis Date  . Anxiety   . Hypertension   . Seizures     Medications:  Scheduled:  . [START ON 03/02/2015] amLODipine  10 mg Oral q morning - 10a  . [START ON 03/02/2015] aspirin EC  81 mg Oral Daily  . [START ON 03/02/2015] benztropine  1 mg Oral BID  . haloperidol  10 mg Oral BID  . heparin  5,000 Units Subcutaneous 3 times per day  . [START ON 03/02/2015] piperacillin-tazobactam (ZOSYN)  IV  3.375 g Intravenous 3 times per day  . potassium chloride  30 mEq Oral Once  . [START ON 03/02/2015] QUEtiapine  100 mg Oral QPM  . [START ON 03/02/2015] QUEtiapine  200 mg Oral q morning - 10a  . [START ON 03/02/2015] QUEtiapine  300 mg Oral QHS   Infusions:  . sodium chloride     PRN: acetaminophen **OR** acetaminophen, haloperidol Anti-infectives    Start     Dose/Rate Route Frequency Ordered Stop   03/02/15 0200  piperacillin-tazobactam (ZOSYN) IVPB 3.375 g     3.375 g 12.5 mL/hr over 240 Minutes Intravenous 3 times per day 03/01/15 2354     03/01/15 1810  vancomycin  (VANCOCIN) 1 GM/200ML IVPB    Comments:  COTRONE, JILL: cabinet override      03/01/15 1810 03/01/15 1900   03/01/15 1810  piperacillin-tazobactam (ZOSYN) 3.375 GM/50ML IVPB    Comments:  COTRONE, JILL: cabinet override      03/01/15 1810 03/01/15 1900   03/01/15 1800  piperacillin-tazobactam (ZOSYN) IVPB 3.375 g     3.375 g 100 mL/hr over 30 Minutes Intravenous  Once 03/01/15 1754 03/01/15 1906   03/01/15 1800  vancomycin (VANCOCIN) IVPB 1000 mg/200 mL premix     1,000 mg 200 mL/hr over 60 Minutes Intravenous  Once 03/01/15 1754 03/01/15 1935     Assessment: Patient ordered Zosyn for pyelonephritis.   Goal of Therapy:  Resolution of infection  Plan:  Zosyn 3.375 g iv once then 3.375 g EI q 8 h.  Follow up culture results  Valentina Gu 03/01/2015,11:54 PM

## 2015-03-01 NOTE — ED Notes (Signed)
Contact person's name and number from group home: Scarlette Calico [phone]

## 2015-03-01 NOTE — ED Notes (Signed)
Patient to ED with reports of feeling weak and dizzy for a few days. Also reports swollen area to top of abdomen where he has had a surgery in past. Denies any pain or problems with this area just reports it is there.

## 2015-03-01 NOTE — ED Notes (Signed)
CRITICAL VALUE ALERT  Critical value received:  K-2.9  Date of notification:  03/01/15  Time of notification:  1905  Critical value read back:Yes.    Nurse who received alert:  Lennice Sites  MD notified (1st page): [phone]  Time of first page:    MD notified (2nd page):  Time of second page:  Responding MD:  Dr Inocencio Homes  Time MD responded:  [id number]

## 2015-03-02 LAB — COMPREHENSIVE METABOLIC PANEL
ALBUMIN: 2.8 g/dL — AB (ref 3.5–5.0)
ALT: 24 U/L (ref 17–63)
AST: 39 U/L (ref 15–41)
Alkaline Phosphatase: 62 U/L (ref 38–126)
Anion gap: 8 (ref 5–15)
BUN: 27 mg/dL — ABNORMAL HIGH (ref 6–20)
CHLORIDE: 104 mmol/L (ref 101–111)
CO2: 26 mmol/L (ref 22–32)
Calcium: 8.3 mg/dL — ABNORMAL LOW (ref 8.9–10.3)
Creatinine, Ser: 2.09 mg/dL — ABNORMAL HIGH (ref 0.61–1.24)
GFR calc Af Amer: 39 mL/min — ABNORMAL LOW (ref >60)
GFR, EST NON AFRICAN AMERICAN: 34 mL/min — AB (ref >60)
GLUCOSE: 125 mg/dL — AB (ref 65–99)
POTASSIUM: 4 mmol/L (ref 3.5–5.1)
SODIUM: 138 mmol/L (ref 135–145)
Total Bilirubin: 2.5 mg/dL — ABNORMAL HIGH (ref 0.3–1.2)
Total Protein: 7.1 g/dL (ref 6.5–8.1)

## 2015-03-02 LAB — CBC
HEMATOCRIT: 37.3 % — AB (ref 40.0–52.0)
Hemoglobin: 12.7 g/dL — ABNORMAL LOW (ref 13.0–18.0)
MCH: 31.9 pg (ref 26.0–34.0)
MCHC: 34 g/dL (ref 32.0–36.0)
MCV: 93.8 fL (ref 80.0–100.0)
Platelets: 190 10*3/uL (ref 150–440)
RBC: 3.98 MIL/uL — ABNORMAL LOW (ref 4.40–5.90)
RDW: 12 % (ref 11.5–14.5)
WBC: 13.3 10*3/uL — AB (ref 3.8–10.6)

## 2015-03-02 MED ORDER — SENNOSIDES-DOCUSATE SODIUM 8.6-50 MG PO TABS
2.0000 | ORAL_TABLET | Freq: Two times a day (BID) | ORAL | Status: DC
  Administered 2015-03-02 – 2015-03-03 (×3): 2 via ORAL
  Filled 2015-03-02 (×3): qty 2

## 2015-03-02 MED ORDER — MAGNESIUM HYDROXIDE 400 MG/5ML PO SUSP
30.0000 mL | Freq: Every day | ORAL | Status: DC | PRN
  Administered 2015-03-02: 30 mL via ORAL
  Filled 2015-03-02: qty 30

## 2015-03-02 NOTE — Progress Notes (Signed)
[facility] [place] at [facility]   PATIENT NAME: Roberto Vasquez    MR#:  [id number]  DATE OF BIRTH:  [birth year]  SUBJECTIVE:  CHIEF COMPLAINT:   Chief Complaint  Patient presents with  . Weakness  . Dizziness  . Hernia   - admitted with abdominal pain, decreased appetie - Improving, labs with pyelonpehritis - on IV ABX  REVIEW OF SYSTEMS:  Review of Systems  Constitutional: Negative for fever and chills.  Respiratory: Negative for cough, shortness of breath and wheezing.   Cardiovascular: Negative for chest pain and palpitations.  Gastrointestinal: Negative for nausea, vomiting, abdominal pain, diarrhea and constipation.  Genitourinary: Negative for dysuria.  Neurological: Negative for dizziness, seizures and headaches.    DRUG ALLERGIES:  No Known Allergies  VITALS:  Blood pressure 120/80, pulse 110, temperature 99.9 F (37.7 C), temperature source Oral, resp. rate 18, height 6\' 4"  (1.93 m), weight 72.576 kg (160 lb), SpO2 100 %.  PHYSICAL EXAMINATION:  Physical Exam  GENERAL:  54 y.o.-year-old patient lying in the bed with no acute distress.  EYES: Pupils equal, round, reactive to light and accommodation. No scleral icterus. Extraocular muscles intact.  HEENT: Head atraumatic, normocephalic. Oropharynx and nasopharynx clear.  NECK:  Supple, no jugular venous distention. No thyroid enlargement, no tenderness.  LUNGS: Normal breath sounds bilaterally, no wheezing, rales,rhonchi or crepitation. No use of accessory muscles of respiration.  CARDIOVASCULAR: S1, S2 normal. No murmurs, rubs, or gallops.  ABDOMEN: Soft, nontender, nondistended. Bowel sounds present. No organomegaly or mass.  EXTREMITIES: No pedal edema, cyanosis, or clubbing.  NEUROLOGIC: Cranial nerves II through XII are intact. Muscle strength 5/5 in all extremities. Sensation intact. Gait not checked.  PSYCHIATRIC: The patient is alert and oriented x 3.  SKIN: No obvious rash,  lesion, or ulcer.    LABORATORY PANEL:   CBC  Recent Labs Lab 03/02/15 0539  WBC 13.3*  HGB 12.7*  HCT 37.3*  PLT 190   ------------------------------------------------------------------------------------------------------------------  Chemistries   Recent Labs Lab 03/02/15 0539  NA 138  K 4.0  CL 104  CO2 26  GLUCOSE 125*  BUN 27*  CREATININE 2.09*  CALCIUM 8.3*  AST 39  ALT 24  ALKPHOS 62  BILITOT 2.5*   ------------------------------------------------------------------------------------------------------------------  Cardiac Enzymes  Recent Labs Lab 03/01/15 1800  TROPONINI <0.03   ------------------------------------------------------------------------------------------------------------------  RADIOLOGY:  Ct Abdomen Pelvis Wo Contrast  03/01/2015   CLINICAL DATA:  Abdominal pain. Initial encounter. Weakness and dizziness for several days. Abdominal swelling.  EXAM: CT ABDOMEN AND PELVIS WITHOUT CONTRAST  TECHNIQUE: Multidetector CT imaging of the abdomen and pelvis was performed following the standard protocol without IV contrast.  COMPARISON:  None.  FINDINGS: Musculoskeletal: L5-S1 predominant lumbar spondylosis. No aggressive osseous lesions. Mild subchondral sclerosis in the femoral heads bilaterally without collapse is most compatible with degenerative eburnation.  Lung Bases: Atelectasis.  Scarring in the RIGHT middle lobe.  Liver:  Normal.  Spleen:  Normal.  Gallbladder:  Normal.  Common bile duct:  Normal.  Pancreas:  Normal.  Adrenal glands:  Normal.  Kidneys: LEFT renal enlargement and perinephric stranding. The LEFT ureter is also mildly dilated extending into the anatomic pelvis. No radiopaque calculus is present. Periureteric stranding is present on the LEFT. LEFT UVJ appears normal. The RIGHT kidney and ureter are within normal limits aside from a hyperdense cyst in the interpolar region.  Stomach:  Within normal limits.  Small bowel:  No inflammatory  changes or obstruction.  Colon: Large stool  burden. Relative decompression of the sigmoid colon.  Pelvic Genitourinary: Mild thickening of the urinary bladder for the degree of distension. This may be secondary to bladder outflow obstruction or cystitis.  Peritoneum: No free air.  No free fluid.  Vascular/lymphatic: Atherosclerosis without an acute vascular abnormality accounting for noncontrast technique. Reactive lymph nodes are present in the LEFT retroperitoneum associated with LEFT renal inflammatory changes.  Body Wall: Fat containing periumbilical and supraumbilical hernias.  IMPRESSION: 1. Inflammatory changes of the LEFT kidney with ectasia of the LEFT ureter. No calculi. Findings are cyst crash that findings are suggestive of an ascending urinary tract infection. 2. Thickening of the urinary bladder wall for the degree of distension, also compatible with infection and cystitis. 3. Fat containing periumbilical and supraumbilical ventral hernias.   Electronically Signed   By: Andreas Newport M.D.   On: 03/01/2015 20:30   Dg Chest Port 1 View  03/01/2015   CLINICAL DATA:  Dizziness and weakness for several days.  EXAM: PORTABLE CHEST - 1 VIEW  COMPARISON:  None.  FINDINGS: The heart size and mediastinal contours are within normal limits. Low lung volumes are noted. Linear opacity in both lung bases may be due to atelectasis or scarring. No evidence of pulmonary consolidation or edema. No evidence of pneumothorax or pleural effusion.  IMPRESSION: Low lung volumes with bibasilar atelectasis versus scarring.   Electronically Signed   By: Myles Rosenthal M.D.   On: 03/01/2015 18:37    EKG:   Orders placed or performed during the hospital encounter of 03/01/15  . EKG 12-Lead  . EKG 12-Lead    ASSESSMENT AND PLAN:   55 year old male with past medical history significant for mild mental retardation from group home, anxiety hypertension and seizure disorder brought in secondary to sepsis  #1  sepsis-secondary to acute pyelonephritis, blood and urine cultures done. -On Zosyn -Blood pressure stable continue to monitor.  #2 acute renal failure-likely ATN from sepsis. Improving renal function. -Continue IV fluids -Avoid nephrotoxins. -CT of the abdomen with left-sided perinephric stranding and dilated kidney. No obstruction noted.  #3 Hypokalemia-replaced and normalized today  #4 seizure disorder and bipolar-continue home medications  #5 hypertension continue Norvasc  #6 DVT prophylaxis-on subcutaneous heparin.   All the records are reviewed and case discussed with Care Management/Social Workerr. Management plans discussed with the patient, family and they are in agreement.  CODE STATUS: Full code  TOTAL TIME TAKING CARE OF THIS PATIENT: 36 minutes.   POSSIBLE D/C TOMORROW, DEPENDING ON CLINICAL CONDITION.   Nazyia Gaugh M.D on 03/02/2015 at 1:45 PM  Between 7am to 6pm - Pager - [phone]  After 6pm go to [email or web address] - password [email or web address]  [facility]  Office  [phone]  CC: Primary care physician; No primary care provider on file.

## 2015-03-02 NOTE — Progress Notes (Signed)
Per MD patient may D/C tomorrow 03/03/15. Weekend Clinical Social Worker (CSW) can call Scarlette Calico at [phone] and arrange a pick up time. If Scarlette Calico does not answer CSW can call [phone] to also arrange a ride. CSW made Scarlette Calico group home owner aware of possible D/C tomorrow. Per Scarlette Calico if patient needs new medications like an antibiotic she requested that hospital send enough for the weekend because the pharmacy will be closed. CSW made RN aware of above. CSW will continue to follow and assist as needed.   Jetta Lout, LCSWA [phone]

## 2015-03-02 NOTE — Progress Notes (Signed)
Patient is alert and oriented. Patient needs verbal commands with teach back  to complete task. Patient denies pain. IV antibiotics infusing. Voiding without diffculty,  up with assist. Sleeping between care.

## 2015-03-02 NOTE — Progress Notes (Signed)
Initial Nutrition Assessment  DOCUMENTATION CODES:     INTERVENTION:   (Meals and Snacks: Cater to patient preferences)  NUTRITION DIAGNOSIS:  Inadequate oral intake related to acute illness as evidenced by per patient/family report.  GOAL:  Patient will meet greater than or equal to 90% of their needs  MONITOR:   (Energy intake, Electrolyte and Renal Profile, Glucose Profile)  REASON FOR ASSESSMENT:   (RD Screen- Diet)    ASSESSMENT:  Reason For Admission: pyelonephritis PMHx:  Past Medical History  Diagnosis Date  . Anxiety   . Hypertension   . Seizures     Typical Fluid/ Food Intake: no intake noted; per patient, he has a good appetite but only ate a few bites of his breakfast tray this AM  Meal/ Snack Patterns: Patient reports a good appetite and intake of a regular diet with no restrictions PTA.  Supplements: None  Labs:  Electrolyte and Renal Profile:  Recent Labs Lab 03/01/15 1800 03/02/15 0539  BUN 33* 27*  CREATININE 2.97* 2.09*  NA 132* 138  K 2.9* 4.0   Protein Profile:  Recent Labs Lab 03/01/15 1800 03/02/15 0539  ALBUMIN 3.0* 2.8*    Meds: NS @ 75/ hr  Physical Findings: n/a Weight Changes: Patient reports a UBW of 160#, but would like to gain weight to a goal of 180#.    Height:  Ht Readings from Last 1 Encounters:  03/01/15 6\' 4"  (1.93 m)    Weight:  Wt Readings from Last 1 Encounters:  03/02/15 160 lb (72.576 kg)    Ideal Body Weight:     Wt Readings from Last 10 Encounters:  03/02/15 160 lb (72.576 kg)    BMI:  Body mass index is 19.48 kg/(m^2).  Skin:  Reviewed, no issues  Diet Order:  Diet renal with fluid restriction Fluid restriction:: 1200 mL Fluid; Room service appropriate?: Yes; Fluid consistency:: Thin  EDUCATION NEEDS:  No education needs identified at this time   Intake/Output Summary (Last 24 hours) at 03/02/15 1121 Last data filed at 03/02/15 0500  Gross per 24 hour  Intake    240 ml   Output    800 ml  Net   -560 ml    Last BM:  6/6  Joeseph Amor, RDN Pager: [phone] Office: [phone]  LOW Care Level

## 2015-03-02 NOTE — Clinical Social Work Note (Signed)
Clinical Social Work Assessment  Patient Details  Name: Roberto Vasquez MRN: [id number] Date of Birth: [birth year]  Date of referral:  03/02/15               Reason for consult:  Other (Comment Required) (From Group Home )                Permission sought to share information with:  Chartered certified accountant granted to share information::  Yes, Verbal Permission Granted  Name::      Joaquim Lai   Agency::   [facility]  Relationship::   Group Home Owner  Contact Information:   [phone]  Housing/Transportation Living arrangements for the past 2 months:  [place] of Information:  Patient, Other (Comment Required) (Group Home owner ) Patient Interpreter Needed:  None Criminal Activity/Legal Involvement Pertinent to Current Situation/Hospitalization:  No - Comment as needed Significant Relationships:  Other(Comment) (Group Home Owner ) Lives with:  Facility Resident Do you feel safe going back to the place where you live?  Yes Need for family participation in patient care:  Yes (Comment)  Care giving concerns:  Patient is a group home resident.    Social Worker assessment / plan:  Holiday representative (CSW) received consult that patient is from a facility. CSW met with patient to address consult. CSW introduced self and explained role of CSW department. Patient was laying in bed with a blanket over his head. Patient was pleasant and alert and oriented. Patient reported that he has been a group home resident at [facility] for 2 years now. Patient is agreeable to returning to group home. Per patient he has no family in the area and his primary support is Joaquim Lai the group home owner. CSW contacted [facility] owner. Per Joaquim Lai the Group Home is called [facility] and is located at [address], [place]. Joaquim Lai reported that patient can return.   FL2 complete and on chart.  Employment status:  Disabled (Comment on whether or not currently  receiving Disability) Insurance information:  Medicaid In [place] PT Recommendations:  Not assessed at this time Information / Referral to community resources:  Other (Comment Required) (Group Home )  Patient/Family's Response to care:  Patient is agreeable to returning to group home.   Patient/Family's Understanding of and Emotional Response to Diagnosis, Current Treatment, and Prognosis:  Patient was pleasant and laying in the bed. Patient thanked CSW for visit.   Emotional Assessment Appearance:  Appears stated age Attitude/Demeanor/Rapport:    Affect (typically observed):  Accepting, Pleasant, Quiet Orientation:  Oriented to Self, Oriented to Place, Oriented to  Time Alcohol / Substance use:  Not Applicable Psych involvement (Current and /or in the community):  No (Comment)  Discharge Needs  Concerns to be addressed:  No discharge needs identified Readmission within the last 30 days:  No Current discharge risk:  None Barriers to Discharge:  Continued Medical Work up   Loralyn Freshwater, LCSW 03/02/2015, 3:21 PM

## 2015-03-03 LAB — BASIC METABOLIC PANEL
Anion gap: 8 (ref 5–15)
BUN: 20 mg/dL (ref 6–20)
CO2: 25 mmol/L (ref 22–32)
Calcium: 8.5 mg/dL — ABNORMAL LOW (ref 8.9–10.3)
Chloride: 104 mmol/L (ref 101–111)
Creatinine, Ser: 1.78 mg/dL — ABNORMAL HIGH (ref 0.61–1.24)
GFR calc Af Amer: 47 mL/min — ABNORMAL LOW (ref >60)
GFR, EST NON AFRICAN AMERICAN: 41 mL/min — AB (ref >60)
GLUCOSE: 116 mg/dL — AB (ref 65–99)
POTASSIUM: 3.9 mmol/L (ref 3.5–5.1)
SODIUM: 137 mmol/L (ref 135–145)

## 2015-03-03 LAB — URINE CULTURE

## 2015-03-03 MED ORDER — LEVOFLOXACIN 500 MG PO TABS: 500.0000 mg | ORAL_TABLET | Freq: Every day | ORAL | Status: DC

## 2015-03-03 MED ORDER — LEVOFLOXACIN 250 MG PO TABS: 250.0000 mg | ORAL_TABLET | Freq: Every day | ORAL | Status: DC

## 2015-03-03 NOTE — Progress Notes (Signed)
Pt for d/c back to his group home on [address] today. CSW spoke with Casimiro Needle at group home [phone] who reports they will provide transport and requested CSW call in new Rx to [facility]. New Rx called in to pt's pharmacy and FL2 updated. RN aware. CSW signing off at d/c.  Dellie Burns, MSW, LCSW [phone] (weekend coverage)

## 2015-03-03 NOTE — Progress Notes (Signed)
[facility] Physicians - [place] at [facility]   PATIENT NAME: Roberto Vasquez    MR#:  [id number]  DATE OF BIRTH:  03-12-1961  SUBJECTIVE:  CHIEF COMPLAINT:   Chief Complaint  Patient presents with  . Weakness  . Dizziness  . Hernia   - Patient feels well. Much improved left flank pain - No fevers, wbc improving - possible discharge today  REVIEW OF SYSTEMS:  Review of Systems  Constitutional: Negative for fever and chills.  Respiratory: Negative for cough, shortness of breath and wheezing.   Cardiovascular: Negative for chest pain and palpitations.  Gastrointestinal: Negative for nausea, vomiting, abdominal pain, diarrhea and constipation.  Genitourinary: Negative for dysuria.  Neurological: Negative for dizziness, seizures and headaches.    DRUG ALLERGIES:  No Known Allergies  VITALS:  Blood pressure 111/82, pulse 92, temperature 98.3 F (36.8 C), temperature source Oral, resp. rate 18, height  (1.93 m), weight 89.495 kg (197 lb 4.8 oz), SpO2 100 %.  PHYSICAL EXAMINATION:  Physical Exam  GENERAL:  54 y.o.-year-old patient lying in the bed with no acute distress.  EYES: Pupils equal, round, reactive to light and accommodation. No scleral icterus. Extraocular muscles intact.  HEENT: Head atraumatic, normocephalic. Oropharynx and nasopharynx clear.  NECK:  Supple, no jugular venous distention. No thyroid enlargement, no tenderness.  LUNGS: Normal breath sounds bilaterally, no wheezing, rales,rhonchi or crepitation. No use of accessory muscles of respiration.  CARDIOVASCULAR: S1, S2 normal. No murmurs, rubs, or gallops.  ABDOMEN: Soft, nontender, nondistended. Bowel sounds present. No organomegaly or mass.  EXTREMITIES: No pedal edema, cyanosis, or clubbing.  NEUROLOGIC: Cranial nerves II through XII are intact. Muscle strength 5/5 in all extremities. Sensation intact. Gait not checked.  PSYCHIATRIC: The patient is alert and oriented x 3.  SKIN: No  obvious rash, lesion, or ulcer.    LABORATORY PANEL:   CBC  Recent Labs Lab 03/02/15 0539  WBC 13.3*  HGB 12.7*  HCT 37.3*  PLT 190   ------------------------------------------------------------------------------------------------------------------  Chemistries   Recent Labs Lab 03/02/15 0539 03/03/15 0338  NA 138 137  K 4.0 3.9  CL 104 104  CO2 26 25  GLUCOSE 125* 116*  BUN 27* 20  CREATININE 2.09* 1.78*  CALCIUM 8.3* 8.5*  AST 39  --   ALT 24  --   ALKPHOS 62  --   BILITOT 2.5*  --    ------------------------------------------------------------------------------------------------------------------  Cardiac Enzymes  Recent Labs Lab 03/01/15 1800  TROPONINI <0.03   ------------------------------------------------------------------------------------------------------------------  RADIOLOGY:  Ct Abdomen Pelvis Wo Contrast  03/01/2015   CLINICAL DATA:  Abdominal pain. Initial encounter. Weakness and dizziness for several days. Abdominal swelling.  EXAM: CT ABDOMEN AND PELVIS WITHOUT CONTRAST  TECHNIQUE: Multidetector CT imaging of the abdomen and pelvis was performed following the standard protocol without IV contrast.  COMPARISON:  None.  FINDINGS: Musculoskeletal: L5-S1 predominant lumbar spondylosis. No aggressive osseous lesions. Mild subchondral sclerosis in the femoral heads bilaterally without collapse is most compatible with degenerative eburnation.  Lung Bases: Atelectasis.  Scarring in the RIGHT middle lobe.  Liver:  Normal.  Spleen:  Normal.  Gallbladder:  Normal.  Common bile duct:  Normal.  Pancreas:  Normal.  Adrenal glands:  Normal.  Kidneys: LEFT renal enlargement and perinephric stranding. The LEFT ureter is also mildly dilated extending into the anatomic pelvis. No radiopaque calculus is present. Periureteric stranding is present on the LEFT. LEFT UVJ appears normal. The RIGHT kidney and ureter are within normal limits aside from a  hyperdense cyst in the  interpolar region.  Stomach:  Within normal limits.  Small bowel:  No inflammatory changes or obstruction.  Colon: Large stool burden. Relative decompression of the sigmoid colon.  Pelvic Genitourinary: Mild thickening of the urinary bladder for the degree of distension. This may be secondary to bladder outflow obstruction or cystitis.  Peritoneum: No free air.  No free fluid.  Vascular/lymphatic: Atherosclerosis without an acute vascular abnormality accounting for noncontrast technique. Reactive lymph nodes are present in the LEFT retroperitoneum associated with LEFT renal inflammatory changes.  Body Wall: Fat containing periumbilical and supraumbilical hernias.  IMPRESSION: 1. Inflammatory changes of the LEFT kidney with ectasia of the LEFT ureter. No calculi. Findings are cyst crash that findings are suggestive of an ascending urinary tract infection. 2. Thickening of the urinary bladder wall for the degree of distension, also compatible with infection and cystitis. 3. Fat containing periumbilical and supraumbilical ventral hernias.   Electronically Signed   By: Andreas Newport M.D.   On: 03/01/2015 20:30   Dg Chest Port 1 View  03/01/2015   CLINICAL DATA:  Dizziness and weakness for several days.  EXAM: PORTABLE CHEST - 1 VIEW  COMPARISON:  None.  FINDINGS: The heart size and mediastinal contours are within normal limits. Low lung volumes are noted. Linear opacity in both lung bases may be due to atelectasis or scarring. No evidence of pulmonary consolidation or edema. No evidence of pneumothorax or pleural effusion.  IMPRESSION: Low lung volumes with bibasilar atelectasis versus scarring.   Electronically Signed   By: Myles Rosenthal M.D.   On: 03/01/2015 18:37    EKG:   Orders placed or performed during the hospital encounter of 03/01/15  . EKG 12-Lead  . 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 -On Zosyn- change to levaquin at discharge- total 10days -Blood pressure stable continue to monitor.  #2 acute renal failure-likely ATN from sepsis. Improving renal function. -Continue IV fluids -Avoid nephrotoxins. -CT of the abdomen with left-sided perinephric stranding and dilated kidney. No obstruction noted.  #3 Hypokalemia-replaced and normalized   #4 seizure disorder and bipolar-continue home medications  #5 hypertension continue Norvasc  #6 DVT prophylaxis-on subcutaneous heparin.   All the records are reviewed and case discussed with Care Management/Social Workerr. Management plans discussed with the patient, family and they are in agreement.  CODE STATUS: Full code  TOTAL TIME TAKING CARE OF THIS PATIENT: 38 minutes.   POSSIBLE D/C TOMORROW, DEPENDING ON CLINICAL CONDITION.   Enid Baas M.D on 03/03/2015 at 10:06 AM  Between 7am to 6pm - Pager - [phone]  After 6pm go to [email or web address] - password [email or web address] [facility]  [place] [facility]  Office  [phone]  CC: Primary care physician; No primary care provider on file.

## 2015-03-03 NOTE — Discharge Instructions (Signed)
°  DIET:  Cardiac diet  DISCHARGE CONDITION:  Stable  ACTIVITY:  Activity as tolerated  OXYGEN:  Home Oxygen: No.   Oxygen Delivery: room air  DISCHARGE LOCATION:  group home   If you experience worsening of your admission symptoms, develop shortness of breath, life threatening emergency, suicidal or homicidal thoughts you must seek medical attention immediately by calling 911 or calling your MD immediately  if symptoms less severe.  You Must read complete instructions/literature along with all the possible adverse reactions/side effects for all the Medicines you take and that have been prescribed to you. Take any new Medicines after you have completely understood and accpet all the possible adverse reactions/side effects.   Please note  You were cared for by a hospitalist during your hospital stay. If you have any questions about your discharge medications or the care you received while you were in the hospital after you are discharged, you can call the unit and asked to speak with the hospitalist on call if the hospitalist that took care of you is not available. Once you are discharged, your primary care physician will handle any further medical issues. Please note that NO REFILLS for any discharge medications will be authorized once you are discharged, as it is imperative that you return to your primary care physician (or establish a relationship with a primary care physician if you do not have one) for your aftercare needs so that they can reassess your need for medications and monitor your lab values.

## 2015-03-03 NOTE — Discharge Summary (Signed)
[facility] Physicians - [place] at [facility]   PATIENT NAME: Roberto Vasquez    MR#:  [id number]  DATE OF BIRTH:  1961-03-26  DATE OF ADMISSION:  03/01/2015 ADMITTING PHYSICIAN: Crissie Figures, MD  DATE OF DISCHARGE: 03/03/15  PRIMARY CARE PHYSICIAN: No primary care provider on file.    ADMISSION DIAGNOSIS:  Hypokalemia [E87.6] Pyelonephritis [N12] Sepsis, due to unspecified organism [A41.9] Acute renal failure, unspecified acute renal failure type [N17.9]  DISCHARGE DIAGNOSIS:  Principal Problem:   Pyelonephritis Active Problems:   Hypokalemia   Renal failure   HTN (hypertension)   Sepsis   SECONDARY DIAGNOSIS:   Past Medical History  Diagnosis Date  . Anxiety   . Hypertension   . 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 -On Zosyn- change to levaquin at discharge- total 10days -Blood pressure stable continue to monitor.  #2 acute renal failure-likely ATN from sepsis. Improving renal function. -Continue IV fluids -Avoid nephrotoxins. -CT of the abdomen with left-sided perinephric stranding and dilated kidney. No obstruction noted.  #3 Hypokalemia-replaced and normalized   #4 seizure disorder and bipolar-continue home medications  #5 hypertension continue Norvasc  DISCHARGE CONDITIONS:   Stable  CONSULTS OBTAINED:    None  DRUG ALLERGIES:  No Known Allergies  DISCHARGE MEDICATIONS:   Current Discharge Medication List    START taking these medications   Details  levofloxacin (LEVAQUIN) 250 MG tablet Take 1 tablet (250 mg total) by mouth daily. Qty: 7 tablet, Refills: 0      CONTINUE these medications which have NOT CHANGED   Details  amLODipine (NORVASC) 10 MG tablet Take 10 mg by mouth  every morning.    benztropine (COGENTIN) 1 MG tablet Take 1 mg by mouth 2 (two) times daily.    !! haloperidol (HALDOL) 10 MG tablet Take 10 mg by mouth 2 (two) times daily.    !! haloperidol (HALDOL) 5 MG tablet Take 5 mg by mouth as needed (for paranoia).    haloperidol decanoate (HALDOL DECANOATE) 100 MG/ML injection Inject 200 mg into the muscle every 30 (thirty) days.    !! QUEtiapine (SEROQUEL) 100 MG tablet Take 100-200 mg by mouth 2 (two) times daily. Pt takes two tablets in the morning and one tablet in the evening.    !! QUEtiapine (SEROQUEL) 300 MG tablet Take 300 mg by mouth at bedtime.     !! - Potential duplicate medications found. Please discuss with provider.    STOP taking these medications     hydrochlorothiazide (HYDRODIURIL) 12.5 MG tablet      lisinopril (PRINIVIL,ZESTRIL) 20 MG tablet          DISCHARGE INSTRUCTIONS:   1. PCP f/u in 1-2 weeks 2. Psychiatrist f/u in 1-2 weeks  If you experience worsening of your admission symptoms, develop shortness of breath, life threatening emergency, suicidal or homicidal thoughts you must seek medical attention immediately by calling 911 or calling your MD immediately  if symptoms less severe.  You Must read complete instructions/literature along with all the possible adverse reactions/side effects for all the Medicines you take and that have been prescribed to you. Take any new Medicines after you have completely understood and accept all the possible adverse reactions/side effects.   Please note  You were cared for by  a hospitalist during your hospital stay. If you have any questions about your discharge medications or the care you received while you were in the hospital after you are discharged, you can call the unit and asked to speak with the hospitalist on call if the hospitalist that took care of you is not available. Once you are discharged, your primary care physician will handle any further medical issues.  Please note that NO REFILLS for any discharge medications will be authorized once you are discharged, as it is imperative that you return to your primary care physician (or establish a relationship with a primary care physician if you do not have one) for your aftercare needs so that they can reassess your need for medications and monitor your lab values.    Today   CHIEF COMPLAINT:   Chief Complaint  Patient presents with  . Weakness  . Dizziness  . Hernia    VITAL SIGNS:  Blood pressure 111/82, pulse 92, temperature 98.3 F (36.8 C), temperature source Oral, resp. rate 18, height  (1.93 m), weight 89.495 kg (197 lb 4.8 oz), SpO2 100 %.  I/O:   Intake/Output Summary (Last 24 hours) at 03/03/15 1015 Last data filed at 03/03/15 0946  Gross per 24 hour  Intake   1645 ml  Output   2000 ml  Net   -355 ml    PHYSICAL EXAMINATION:   Physical Exam  GENERAL: 54 y.o.-year-old patient lying in the bed with no acute distress.  EYES: Pupils equal, round, reactive to light and accommodation. No scleral icterus. Extraocular muscles intact.  HEENT: Head atraumatic, normocephalic. Oropharynx and nasopharynx clear.  NECK: Supple, no jugular venous distention. No thyroid enlargement, no tenderness.  LUNGS: Normal breath sounds bilaterally, no wheezing, rales,rhonchi or crepitation. No use of accessory muscles of respiration.  CARDIOVASCULAR: S1, S2 normal. No murmurs, rubs, or gallops.  ABDOMEN: Soft, nontender, nondistended. Bowel sounds present. No organomegaly or mass. Minimal discomfort in left flank region  EXTREMITIES: No pedal edema, cyanosis, or clubbing.  NEUROLOGIC: Cranial nerves II through XII are intact. Muscle strength 5/5 in all extremities. Sensation intact. Gait not checked.  PSYCHIATRIC: The patient is alert and oriented x 3.  SKIN: No obvious rash, lesion, or ulcer.   DATA REVIEW:   CBC  Recent Labs Lab 03/02/15 0539  WBC 13.3*  HGB 12.7*  HCT  37.3*  PLT 190    Chemistries   Recent Labs Lab 03/02/15 0539 03/03/15 0338  NA 138 137  K 4.0 3.9  CL 104 104  CO2 26 25  GLUCOSE 125* 116*  BUN 27* 20  CREATININE 2.09* 1.78*  CALCIUM 8.3* 8.5*  AST 39  --   ALT 24  --   ALKPHOS 62  --   BILITOT 2.5*  --     Cardiac Enzymes  Recent Labs Lab 03/01/15 1800  TROPONINI <0.03    Microbiology Results  Results for orders placed or performed during the hospital encounter of 03/01/15  Blood culture (routine x 2)     Status: None (Preliminary result)   Collection Time: 03/01/15  6:00 PM  Result Value Ref Range Status   Specimen Description BLOOD  Final   Special Requests Normal  Final   Culture NO GROWTH 2 DAYS  Final   Report Status PENDING  Incomplete  Blood culture (routine x 2)     Status: None (Preliminary result)   Collection Time: 03/01/15  6:01 PM  Result Value Ref Range Status  Specimen Description BLOOD  Final   Special Requests Normal  Final   Culture NO GROWTH 2 DAYS  Final   Report Status PENDING  Incomplete  Urine culture     Status: None   Collection Time: 03/01/15  6:58 PM  Result Value Ref Range Status   Specimen Description URINE, CLEAN CATCH  Final   Special Requests NONE  Final   Culture   Final    MULTIPLE SPECIES PRESENT, SUGGEST RECOLLECTION IF CLINICALLY INDICATED   Report Status 03/03/2015 FINAL  Final    RADIOLOGY:  Ct Abdomen Pelvis Wo Contrast  03/01/2015   CLINICAL DATA:  Abdominal pain. Initial encounter. Weakness and dizziness for several days. Abdominal swelling.  EXAM: CT ABDOMEN AND PELVIS WITHOUT CONTRAST  TECHNIQUE: Multidetector CT imaging of the abdomen and pelvis was performed following the standard protocol without IV contrast.  COMPARISON:  None.  FINDINGS: Musculoskeletal: L5-S1 predominant lumbar spondylosis. No aggressive osseous lesions. Mild subchondral sclerosis in the femoral heads bilaterally without collapse is most compatible with degenerative eburnation.  Lung  Bases: Atelectasis.  Scarring in the RIGHT middle lobe.  Liver:  Normal.  Spleen:  Normal.  Gallbladder:  Normal.  Common bile duct:  Normal.  Pancreas:  Normal.  Adrenal glands:  Normal.  Kidneys: LEFT renal enlargement and perinephric stranding. The LEFT ureter is also mildly dilated extending into the anatomic pelvis. No radiopaque calculus is present. Periureteric stranding is present on the LEFT. LEFT UVJ appears normal. The RIGHT kidney and ureter are within normal limits aside from a hyperdense cyst in the interpolar region.  Stomach:  Within normal limits.  Small bowel:  No inflammatory changes or obstruction.  Colon: Large stool burden. Relative decompression of the sigmoid colon.  Pelvic Genitourinary: Mild thickening of the urinary bladder for the degree of distension. This may be secondary to bladder outflow obstruction or cystitis.  Peritoneum: No free air.  No free fluid.  Vascular/lymphatic: Atherosclerosis without an acute vascular abnormality accounting for noncontrast technique. Reactive lymph nodes are present in the LEFT retroperitoneum associated with LEFT renal inflammatory changes.  Body Wall: Fat containing periumbilical and supraumbilical hernias.  IMPRESSION: 1. Inflammatory changes of the LEFT kidney with ectasia of the LEFT ureter. No calculi. Findings are cyst crash that findings are suggestive of an ascending urinary tract infection. 2. Thickening of the urinary bladder wall for the degree of distension, also compatible with infection and cystitis. 3. Fat containing periumbilical and supraumbilical ventral hernias.   Electronically Signed   By: Andreas Newport M.D.   On: 03/01/2015 20:30   Dg Chest Port 1 View  03/01/2015   CLINICAL DATA:  Dizziness and weakness for several days.  EXAM: PORTABLE CHEST - 1 VIEW  COMPARISON:  None.  FINDINGS: The heart size and mediastinal contours are within normal limits. Low lung volumes are noted. Linear opacity in both lung bases may be due to  atelectasis or scarring. No evidence of pulmonary consolidation or edema. No evidence of pneumothorax or pleural effusion.  IMPRESSION: Low lung volumes with bibasilar atelectasis versus scarring.   Electronically Signed   By: Myles Rosenthal M.D.   On: 03/01/2015 18:37    EKG:   Orders placed or performed during the hospital encounter of 03/01/15  . EKG 12-Lead  . EKG 12-Lead      Management plans discussed with the patient, family and they are in agreement.  CODE STATUS:     Code Status Orders  Start     Ordered   03/01/15 2314  Full code   Continuous     03/01/15 2314      TOTAL TIME TAKING CARE OF THIS PATIENT: 38 minutes.    Enid Baas M.D on 03/03/2015 at 10:15 AM  Between 7am to 6pm - Pager - [phone]  After 6pm go to [email or web address] - password [email or web address] [facility]  [facility] [place] [facility]  Office  [phone]  CC: Primary care physician; No primary care provider on file.

## 2015-03-03 NOTE — Progress Notes (Signed)
Patient is alert and oriented. . Patient denies pain. IV antibiotics infusing. Voiding without diffculty, up with supervison. Sleeping between care. VSS

## 2015-03-06 LAB — CULTURE, BLOOD (ROUTINE X 2)
CULTURE: NO GROWTH
Culture: NO GROWTH
SPECIAL REQUESTS: NORMAL
Special Requests: NORMAL

## 2015-03-11 ENCOUNTER — Other Ambulatory Visit: Payer: Self-pay

## 2015-03-11 ENCOUNTER — Emergency Department
Admission: EM | Admit: 2015-03-11 | Discharge: 2015-03-11 | Disposition: A | Discharge status: Home / Self Care | Payer: Medicare Other | Attending: Emergency Medicine | Admitting: Emergency Medicine

## 2015-03-11 DIAGNOSIS — R630 Anorexia: Secondary | ICD-10-CM | POA: Diagnosis not present

## 2015-03-11 DIAGNOSIS — Z79899 Other long term (current) drug therapy: Secondary | ICD-10-CM | POA: Insufficient documentation

## 2015-03-11 DIAGNOSIS — R55 Syncope and collapse: Secondary | ICD-10-CM

## 2015-03-11 DIAGNOSIS — Z720 Tobacco use: Secondary | ICD-10-CM | POA: Diagnosis not present

## 2015-03-11 DIAGNOSIS — Z792 Long term (current) use of antibiotics: Secondary | ICD-10-CM | POA: Insufficient documentation

## 2015-03-11 DIAGNOSIS — I10 Essential (primary) hypertension: Secondary | ICD-10-CM | POA: Diagnosis not present

## 2015-03-11 DIAGNOSIS — E860 Dehydration: Secondary | ICD-10-CM | POA: Diagnosis not present

## 2015-03-11 LAB — BASIC METABOLIC PANEL
ANION GAP: 6 (ref 5–15)
BUN: 23 mg/dL — ABNORMAL HIGH (ref 6–20)
CO2: 24 mmol/L (ref 22–32)
Calcium: 8.8 mg/dL — ABNORMAL LOW (ref 8.9–10.3)
Chloride: 105 mmol/L (ref 101–111)
Creatinine, Ser: 2.16 mg/dL — ABNORMAL HIGH (ref 0.61–1.24)
GFR calc Af Amer: 38 mL/min — ABNORMAL LOW (ref >60)
GFR calc non Af Amer: 32 mL/min — ABNORMAL LOW (ref >60)
Glucose, Bld: 92 mg/dL (ref 65–99)
Potassium: 4.3 mmol/L (ref 3.5–5.1)
Sodium: 135 mmol/L (ref 135–145)

## 2015-03-11 LAB — CBC
HEMATOCRIT: 38.7 % — AB (ref 40.0–52.0)
HEMOGLOBIN: 12.9 g/dL — AB (ref 13.0–18.0)
MCH: 31.8 pg (ref 26.0–34.0)
MCHC: 33.3 g/dL (ref 32.0–36.0)
MCV: 95.6 fL (ref 80.0–100.0)
PLATELETS: 380 10*3/uL (ref 150–440)
RBC: 4.05 MIL/uL — ABNORMAL LOW (ref 4.40–5.90)
RDW: 12 % (ref 11.5–14.5)
WBC: 6.4 10*3/uL (ref 3.8–10.6)

## 2015-03-11 MED ORDER — SODIUM CHLORIDE 0.9 % IV BOLUS (SEPSIS)
1000.0000 mL | Freq: Once | INTRAVENOUS | Status: AC
  Administered 2015-03-11: 1000 mL via INTRAVENOUS

## 2015-03-11 NOTE — ED Notes (Signed)
Presents via EMS from [facility] group home following syncopal episode. Seen 4-5 days prior in ED for dehydration per report. A&Ox4. States "went black".

## 2015-03-11 NOTE — Discharge Instructions (Signed)
Dehydration, Adult  You have been seen today in the Emergency Department (ED)  for syncope (passing out).  Your workup including labs and EKG show reassuring results.  Your symptoms may be due to dehydration, so it is important that you drink plenty of non-alcoholic fluids.  Please call your regular doctor as soon as possible to schedule the next available clinic appointment to follow up with him/her regarding your visit to the ED and your symptoms.  Return to the Emergency Department (ED)  if you have any further syncopal episodes (pass out again) or develop ANY chest pain, pressure, tightness, trouble breathing, sudden sweating, or other symptoms that concern you.    Dehydration is when you lose more fluids from the body than you take in. Vital organs like the kidneys, brain, and heart cannot function without a proper amount of fluids and salt. Any loss of fluids from the body can cause dehydration.  CAUSES   Vomiting.  Diarrhea.  Excessive sweating.  Excessive urine output.  Fever. SYMPTOMS  Mild dehydration  Thirst.  Dry lips.  Slightly dry mouth. Moderate dehydration  Very dry mouth.  Sunken eyes.  Skin does not bounce back quickly when lightly pinched and released.  Dark urine and decreased urine production.  Decreased tear production.  Headache. Severe dehydration  Very dry mouth.  Extreme thirst.  Rapid, weak pulse (more than 100 beats per minute at rest).  Cold hands and feet.  Not able to sweat in spite of heat and temperature.  Rapid breathing.  Blue lips.  Confusion and lethargy.  Difficulty being awakened.  Minimal urine production.  No tears. DIAGNOSIS  Your caregiver will diagnose dehydration based on your symptoms and your exam. Blood and urine tests will help confirm the diagnosis. The diagnostic evaluation should also identify the cause of dehydration. TREATMENT  Treatment of mild or moderate dehydration can often be done at home by  increasing the amount of fluids that you drink. It is best to drink small amounts of fluid more often. Drinking too much at one time can make vomiting worse. Refer to the home care instructions below. Severe dehydration needs to be treated at the hospital where you will probably be given intravenous (IV) fluids that contain water and electrolytes. HOME CARE INSTRUCTIONS   Ask your caregiver about specific rehydration instructions.  Drink enough fluids to keep your urine clear or pale yellow.  Drink small amounts frequently if you have nausea and vomiting.  Eat as you normally do.  Avoid:  Foods or drinks high in sugar.  Carbonated drinks.  Juice.  Extremely hot or cold fluids.  Drinks with caffeine.  Fatty, greasy foods.  Alcohol.  Tobacco.  Overeating.  Gelatin desserts.  Wash your hands well to avoid spreading bacteria and viruses.  Only take over-the-counter or prescription medicines for pain, discomfort, or fever as directed by your caregiver.  Ask your caregiver if you should continue all prescribed and over-the-counter medicines.  Keep all follow-up appointments with your caregiver. SEEK MEDICAL CARE IF:  You have abdominal pain and it increases or stays in one area (localizes).  You have a rash, stiff neck, or severe headache.  You are irritable, sleepy, or difficult to awaken.  You are weak, dizzy, or extremely thirsty. SEEK IMMEDIATE MEDICAL CARE IF:   You are unable to keep fluids down or you get worse despite treatment.  You have frequent episodes of vomiting or diarrhea.  You have blood or green matter (bile) in your vomit.  You have blood in your stool or your stool looks black and tarry.  You have not urinated in 6 to 8 hours, or you have only urinated a small amount of very dark urine.  You have a fever.  You faint. MAKE SURE YOU:   Understand these instructions.  Will watch your condition.  Will get help right away if you are not  doing well or get worse. Document Released: 09/08/2005 Document Revised: 12/01/2011 Document Reviewed: 04/28/2011 [facility] Patient Information 2015 [place], [place]. This information is not intended to replace advice given to you by your health care provider. Make sure you discuss any questions you have with your health care provider.  Syncope Syncope is a medical term for fainting or passing out. This means you lose consciousness and drop to the ground. People are generally unconscious for less than 5 minutes. You may have some muscle twitches for up to 15 seconds before waking up and returning to normal. Syncope occurs more often in older adults, but it can happen to anyone. While most causes of syncope are not dangerous, syncope can be a sign of a serious medical problem. It is important to seek medical care.  CAUSES  Syncope is caused by a sudden drop in blood flow to the brain. The specific cause is often not determined. Factors that can bring on syncope include:  Taking medicines that lower blood pressure.  Sudden changes in posture, such as standing up quickly.  Taking more medicine than prescribed.  Standing in one place for too long.  Seizure disorders.  Dehydration and excessive exposure to heat.  Low blood sugar (hypoglycemia).  Straining to have a bowel movement.  Heart disease, irregular heartbeat, or other circulatory problems.  Fear, emotional distress, seeing blood, or severe pain. SYMPTOMS  Right before fainting, you may:  Feel dizzy or light-headed.  Feel nauseous.  See all white or all black in your field of vision.  Have cold, clammy skin. DIAGNOSIS  Your health care provider will ask about your symptoms, perform a physical exam, and perform an electrocardiogram (ECG) to record the electrical activity of your heart. Your health care provider may also perform other heart or blood tests to determine the cause of your syncope which may include:  Transthoracic  echocardiogram (TTE). During echocardiography, sound waves are used to evaluate how blood flows through your heart.  Transesophageal echocardiogram (TEE).  Cardiac monitoring. This allows your health care provider to monitor your heart rate and rhythm in real time.  Holter monitor. This is a portable device that records your heartbeat and can help diagnose heart arrhythmias. It allows your health care provider to track your heart activity for several days, if needed.  Stress tests by exercise or by giving medicine that makes the heart beat faster. TREATMENT  In most cases, no treatment is needed. Depending on the cause of your syncope, your health care provider may recommend changing or stopping some of your medicines. HOME CARE INSTRUCTIONS  Have someone stay with you until you feel stable.  Do not drive, use machinery, or play sports until your health care provider says it is okay.  Keep all follow-up appointments as directed by your health care provider.  Lie down right away if you start feeling like you might faint. Breathe deeply and steadily. Wait until all the symptoms have passed.  Drink enough fluids to keep your urine clear or pale yellow.  If you are taking blood pressure or heart medicine, get up slowly and take  several minutes to sit and then stand. This can reduce dizziness. SEEK IMMEDIATE MEDICAL CARE IF:   You have a severe headache.  You have unusual pain in the chest, abdomen, or back.  You are bleeding from your mouth or rectum, or you have black or tarry stool.  You have an irregular or very fast heartbeat.  You have pain with breathing.  You have repeated fainting or seizure-like jerking during an episode.  You faint when sitting or lying down.  You have confusion.  You have trouble walking.  You have severe weakness.  You have vision problems. If you fainted, call your local emergency services (911 in [place] Do not drive yourself to the hospital.    MAKE SURE YOU:  Understand these instructions.  Will watch your condition.  Will get help right away if you are not doing well or get worse. Document Released: 09/08/2005 Document Revised: 09/13/2013 Document Reviewed: 11/07/2011 [facility] Patient Information 2015 [place], [place]. This information is not intended to replace advice given to you by your health care provider. Make sure you discuss any questions you have with your health care provider.

## 2015-03-11 NOTE — ED Provider Notes (Signed)
[facility] Emergency Department Provider Note  ____________________________________________  Time seen: Approximately 5:16 PM  I have reviewed the triage vital signs and the nursing notes.   HISTORY  Chief Complaint Loss of Consciousness    HPI Roberto Vasquez is a 54 y.o. male patient reports that he thinks he got overheated in the chair. He was sitting in a chair on the deck, been out there for several hours, when he states that he got real weak and lightheaded feeling and then passed out briefly. He awoke on the ground and he where he was. He states no one said he had a seizure. He does not feel he had a seizure. He denies being in any pain. There is no chest pain or trouble breathing. States he got hot and sweaty, and then passed out. He does state that always had to eat today as a "corn dog" and that he would feel like eating a sandwich and soda now.  No headache. No chest pain. No shortness of breath. No numbness or tingling. No weakness. No seizure. He was not incontinent.  States he feels fine now.   Past Medical History  Diagnosis Date  . Anxiety   . Hypertension   . Seizures     Patient Active Problem List   Diagnosis Date Noted  . Sepsis 03/03/2015  . Pyelonephritis 03/01/2015  . Hypokalemia 03/01/2015  . Renal failure 03/01/2015  . HTN (hypertension) 03/01/2015    No past surgical history on file.  Current Outpatient Rx  Name  Route  Sig  Dispense  Refill  . amLODipine (NORVASC) 10 MG tablet   Oral   Take 10 mg by mouth every morning.         . benztropine (COGENTIN) 1 MG tablet   Oral   Take 1 mg by mouth 2 (two) times daily.         . haloperidol (HALDOL) 10 MG tablet   Oral   Take 10 mg by mouth 2 (two) times daily.         . haloperidol (HALDOL) 5 MG tablet   Oral   Take 5 mg by mouth as needed (for paranoia).         . haloperidol decanoate (HALDOL DECANOATE) 100 MG/ML injection   Intramuscular   Inject 200  mg into the muscle every 30 (thirty) days.         Marland Kitchen levofloxacin (LEVAQUIN) 250 MG tablet   Oral   Take 1 tablet (250 mg total) by mouth daily.   7 tablet   0   . QUEtiapine (SEROQUEL) 100 MG tablet   Oral   Take 100-200 mg by mouth 2 (two) times daily. Pt takes two tablets in the morning and one tablet in the evening.         Marland Kitchen QUEtiapine (SEROQUEL) 300 MG tablet   Oral   Take 300 mg by mouth at bedtime.           Allergies Review of patient's allergies indicates no known allergies.  No family history on file.  Social History History  Substance Use Topics  . Smoking status: Current Every Day Smoker  . Smokeless tobacco: Not on file  . Alcohol Use: Yes    Review of Systems Constitutional: No fever/chills Eyes: No visual changes except when he got very weak he felt as though he is "blacking out" ENT: No sore throat. Cardiovascular: Denies chest pain. Respiratory: Denies shortness of breath. Gastrointestinal: No abdominal pain.  No nausea, no vomiting.  No diarrhea.  No constipation. Genitourinary: Negative for dysuria. Musculoskeletal: Negative for back pain. Skin: Negative for rash. Neurological: Negative for headaches, focal weakness or numbness.  10-point ROS otherwise negative.  ____________________________________________   PHYSICAL EXAM:  VITAL SIGNS: ED Triage Vitals  Enc Vitals Group     BP 03/11/15 1625 108/77 mmHg     Pulse Rate 03/11/15 1625 88     Resp 03/11/15 1625 16     Temp 03/11/15 1625 98.1 F (36.7 C)     Temp Source 03/11/15 1625 Oral     SpO2 03/11/15 1625 94 %     Weight 03/11/15 1625 180 lb (81.647 kg)     Height 03/11/15 1625 6' (1.829 m)     Head Cir --      Peak Flow --      Pain Score 03/11/15 1627 0     Pain Loc --      Pain Edu? --      Excl. in GC? --     Constitutional: Alert and oriented. Well appearing and in no acute distress. Eyes: Conjunctivae are normal. PERRL. EOMI. Head: Atraumatic. Nose: No  congestion/rhinnorhea. Mouth/Throat: Mucous membranes are moist.  Oropharynx non-erythematous. Neck: No stridor.   No cervical spine tenderness. Cardiovascular: Normal rate, regular rhythm. Grossly normal heart sounds.  Good peripheral circulation. Respiratory: Normal respiratory effort.  No retractions. Lungs CTAB. Gastrointestinal: Soft and nontender. No distention. No abdominal bruits. No CVA tenderness. Musculoskeletal: No lower extremity tenderness nor edema.  No joint effusions. Neurologic:  Normal speech and language. No gross focal neurologic deficits are appreciated. Speech is normal. No pronator drift. Moves all extremities 5 out of 5. No facial droop. Skin:  Skin is warm, slightly sweaty and intact. No rash noted. Psychiatric: Mood and affect are normal. Speech and behavior are normal.  Overall, patient has a very reassuring and normal examination in the ER. ____________________________________________   LABS (all labs ordered are listed, but only abnormal results are displayed)  Labs Reviewed  CBC - Abnormal; Notable for the following:    RBC 4.05 (*)    Hemoglobin 12.9 (*)    HCT 38.7 (*)    All other components within normal limits  BASIC METABOLIC PANEL - Abnormal; Notable for the following:    BUN 23 (*)    Creatinine, Ser 2.16 (*)    Calcium 8.8 (*)    GFR calc non Af Amer 32 (*)    GFR calc Af Amer 38 (*)    All other components within normal limits   ____________________________________________  EKG  ED ECG REPORT I, QUALE, MARK, the attending physician, personally viewed and interpreted this ECG.  Date: 03/11/2015 EKG Time: 1620 Rate: 78 Rhythm: normal sinus rhythm QRS Axis: normal Intervals: normal ST/T Wave abnormalities: normal Conduction Disutrbances: none Narrative Interpretation: unremarkable, no acute ST abnormality. No prolonged  QT.  ____________________________________________  RADIOLOGY   ____________________________________________   PROCEDURES  Procedure(s) performed: None  Critical Care performed: No  ____________________________________________   INITIAL IMPRESSION / ASSESSMENT AND PLAN / ED COURSE  Pertinent labs & imaging results that were available during my care of the patient were reviewed by me and considered in my medical decision making (see chart for details).  Patient notes no syncope. He is awake and alert and well now. His history fits that of mild dehydration or vasovagal type syncope. No cardiopulmonary symptoms. He is negative for [facility] rule He is awake and alert now  and at baseline. I will hydrate him, provided a meal. I anticipate discharge to home. He has no evidence of acute neurologic, cardiac, or pulmonary abnormality. He is in no pain in no distress.  ----------------------------------------- 6:34 PM on 03/11/2015 -----------------------------------------  Patient is awake alert. He has eaten a meal. He denies any concerns. His laboratory evaluation is reassuring and his vital signs are normal. Suspect the patient is likely suffering some mild dehydration from being in the sun which led to his syncope. There is no evidence of acute cardiopulmonary abnormality. Acute infectious etiology. He has no symptoms of infection at this time. No acute metabolic abnormality on panel. He does have chronic renal insufficiency, we have hydrated him here.  Hydration recommendations, return precautions, and precautions for heat related illness discussed with patient.  We'll discharge him to home. ____________________________________________   FINAL CLINICAL IMPRESSION(S) / ED DIAGNOSES  Final diagnoses:  Syncope, vasovagal  Mild dehydration      Sharyn Creamer, MD 03/11/15 [phone]

## 2015-09-07 ENCOUNTER — Telehealth: Payer: Self-pay

## 2015-09-07 NOTE — Telephone Encounter (Signed)
LVM for pt to return my call to schedule screening colonoscopy.   

## 2015-09-28 NOTE — Telephone Encounter (Signed)
No return call.  Letter mailed

## 2015-12-19 ENCOUNTER — Emergency Department: Payer: Medicare Other

## 2015-12-19 ENCOUNTER — Encounter: Payer: Self-pay | Admitting: Emergency Medicine

## 2015-12-19 ENCOUNTER — Emergency Department
Admission: EM | Admit: 2015-12-19 | Discharge: 2015-12-19 | Disposition: A | Discharge status: Home / Self Care | Payer: Medicare Other | Attending: Emergency Medicine | Admitting: Emergency Medicine

## 2015-12-19 DIAGNOSIS — F17200 Nicotine dependence, unspecified, uncomplicated: Secondary | ICD-10-CM | POA: Insufficient documentation

## 2015-12-19 DIAGNOSIS — Z792 Long term (current) use of antibiotics: Secondary | ICD-10-CM | POA: Insufficient documentation

## 2015-12-19 DIAGNOSIS — S0083XA Contusion of other part of head, initial encounter: Secondary | ICD-10-CM | POA: Diagnosis present

## 2015-12-19 DIAGNOSIS — Y9389 Activity, other specified: Secondary | ICD-10-CM | POA: Diagnosis not present

## 2015-12-19 DIAGNOSIS — R569 Unspecified convulsions: Secondary | ICD-10-CM | POA: Insufficient documentation

## 2015-12-19 DIAGNOSIS — Z79899 Other long term (current) drug therapy: Secondary | ICD-10-CM | POA: Diagnosis not present

## 2015-12-19 DIAGNOSIS — Y998 Other external cause status: Secondary | ICD-10-CM | POA: Diagnosis not present

## 2015-12-19 DIAGNOSIS — Y929 Unspecified place or not applicable: Secondary | ICD-10-CM | POA: Diagnosis not present

## 2015-12-19 DIAGNOSIS — N19 Unspecified kidney failure: Secondary | ICD-10-CM | POA: Diagnosis not present

## 2015-12-19 DIAGNOSIS — I10 Essential (primary) hypertension: Secondary | ICD-10-CM | POA: Diagnosis not present

## 2015-12-19 DIAGNOSIS — A419 Sepsis, unspecified organism: Secondary | ICD-10-CM | POA: Diagnosis not present

## 2015-12-19 DIAGNOSIS — G44319 Acute post-traumatic headache, not intractable: Secondary | ICD-10-CM | POA: Diagnosis not present

## 2015-12-19 MED ORDER — NAPROXEN 500 MG PO TABS: 500.0000 mg | ORAL_TABLET | Freq: Two times a day (BID) | ORAL | Status: DC

## 2015-12-19 NOTE — Discharge Instructions (Signed)
Facial or Scalp Contusion °A facial or scalp contusion is a deep bruise on the face or head. Injuries to the face and head generally cause a lot of swelling, especially around the eyes. Contusions are the result of an injury that caused bleeding under the skin. The contusion may turn blue, purple, or yellow. Minor injuries will give you a painless contusion, but more severe contusions may stay painful and swollen for a few weeks.  °CAUSES  °A facial or scalp contusion is caused by a blunt injury or trauma to the face or head area.  °SIGNS AND SYMPTOMS  °· Swelling of the injured area.   °· Discoloration of the injured area.   °· Tenderness, soreness, or pain in the injured area.   °DIAGNOSIS  °The diagnosis can be made by taking a medical history and doing a physical exam. An X-ray exam, CT scan, or MRI may be needed to determine if there are any associated injuries, such as broken bones (fractures). °TREATMENT  °Often, the best treatment for a facial or scalp contusion is applying cold compresses to the injured area. Over-the-counter medicines may also be recommended for pain control.  °HOME CARE INSTRUCTIONS  °· Only take over-the-counter or prescription medicines as directed by your health care provider.   °· Apply ice to the injured area.   °¨ Put ice in a plastic bag.   °¨ Place a towel between your skin and the bag.   °¨ Leave the ice on for 20 minutes, 2-3 times a day.   °SEEK MEDICAL CARE IF: °· You have bite problems.   °· You have pain with chewing.   °· You are concerned about facial defects. °SEEK IMMEDIATE MEDICAL CARE IF: °· You have severe pain or a headache that is not relieved by medicine.   °· You have unusual sleepiness, confusion, or personality changes.   °· You throw up (vomit).   °· You have a persistent nosebleed.   °· You have double vision or blurred vision.   °· You have fluid drainage from your nose or ear.   °· You have difficulty walking or using your arms or legs.   °MAKE SURE YOU:   °· Understand these instructions. °· Will watch your condition. °· Will get help right away if you are not doing well or get worse. °  °This information is not intended to replace advice given to you by your health care provider. Make sure you discuss any questions you have with your health care provider. °  °Document Released: 10/16/2004 Document Revised: 09/29/2014 Document Reviewed: 04/21/2013 °Elsevier Interactive Patient Education ©2016 [employer] ° °General Assault °Assault includes any behavior or physical attack--whether it is on purpose or not--that results in injury to another person, damage to property, or both. This also includes assault that has not yet happened, but is planned to happen. Threats of assault may be physical, verbal, or written. They may be said or sent by: °· Mail. °· E-mail. °· Text. °· Social media. °· Fax. °The threats may be direct, implied, or understood. °WHAT ARE THE DIFFERENT FORMS OF ASSAULT? °Forms of assault include: °· Physically assaulting a person. This includes physical threats to inflict physical harm as well as: °¨ Slapping. °¨ Hitting. °¨ Poking. °¨ Kicking. °¨ Punching. °¨ Pushing. °· Sexually assaulting a person. Sexual assault is any sexual activity that a person is forced, threatened, or coerced to participate in. It may or may not involve physical contact with the person who is assaulting you. You are sexually assaulted if you are forced to have sexual contact of any kind. °·   Damaging or destroying a person's assistive equipment, such as glasses, canes, or walkers. °· Throwing or hitting objects. °· Using or displaying a weapon to harm or threaten someone. °· Using or displaying an object that appears to be a weapon in a threatening manner. °· Using greater physical size or strength to intimidate someone. °· Making intimidating or threatening gestures. °· Bullying. °· Hazing. °· Using language that is intimidating, threatening, hostile, or  abusive. °· Stalking. °· Restraining someone with force. °WHAT SHOULD I DO IF I EXPERIENCE ASSAULT? °· Report assaults, threats, and stalking to the police. Call your local emergency services (911 in the [place] if you are in immediate danger or you need medical help.  °· You can work with a lawyer or an advocate to get legal protection against someone who has assaulted you or threatened you with assault. Protection includes restraining orders and private addresses. Crimes against you, such as assault, can also be prosecuted through the courts. Laws will vary depending on where you live. °  °This information is not intended to replace advice given to you by your health care provider. Make sure you discuss any questions you have with your health care provider. °  °Document Released: 09/08/2005 Document Revised: 09/29/2014 Document Reviewed: 05/26/2014 °Elsevier Interactive Patient Education ©2016 [employer] ° °
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
## 2016-03-21 ENCOUNTER — Other Ambulatory Visit: Payer: Self-pay | Admitting: Internal Medicine

## 2016-03-21 DIAGNOSIS — K760 Fatty (change of) liver, not elsewhere classified: Secondary | ICD-10-CM

## 2016-04-09 ENCOUNTER — Ambulatory Visit: Payer: Medicare Other

## 2016-04-09 ENCOUNTER — Ambulatory Visit
Admission: RE | Admit: 2016-04-09 | Discharge: 2016-04-09 | Disposition: A | Discharge status: Home / Self Care | Payer: Medicare Other | Source: Ambulatory Visit | Attending: Internal Medicine | Admitting: Internal Medicine

## 2016-04-09 DIAGNOSIS — K8020 Calculus of gallbladder without cholecystitis without obstruction: Secondary | ICD-10-CM | POA: Diagnosis not present

## 2016-04-09 DIAGNOSIS — K760 Fatty (change of) liver, not elsewhere classified: Secondary | ICD-10-CM | POA: Diagnosis not present

## 2016-04-09 DIAGNOSIS — R93421 Abnormal radiologic findings on diagnostic imaging of right kidney: Secondary | ICD-10-CM | POA: Diagnosis not present

## 2016-04-09 DIAGNOSIS — R932 Abnormal findings on diagnostic imaging of liver and biliary tract: Secondary | ICD-10-CM | POA: Insufficient documentation

## 2016-05-12 ENCOUNTER — Ambulatory Visit (INDEPENDENT_AMBULATORY_CARE_PROVIDER_SITE_OTHER): Payer: Medicare Other | Admitting: Gastroenterology

## 2016-05-12 ENCOUNTER — Encounter: Payer: Self-pay | Admitting: Gastroenterology

## 2016-05-12 VITALS — BP 91/61 | HR 96 | Temp 98.2°F | Ht 67.0 in | Wt 179.0 lb

## 2016-05-12 DIAGNOSIS — B182 Chronic viral hepatitis C: Secondary | ICD-10-CM

## 2016-05-12 NOTE — Progress Notes (Signed)
Gastroenterology Consultation  Referring Provider:     No ref. provider found Primary Care Physician:  Sherrie MustacheFayegh Jadali, MD Primary Gastroenterologist:  Dr. Servando SnareWohl     Reason for Consultation:     Hepatitis C        HPI:   Roberto BasquesWalter J Spruce is a 55 y.o. y/o male referred for consultation & management of Hepatitis C by Dr. Sherrie MustacheFayegh Jadali, MD.  This patient comes today with a finding of hepatitis C antibody positive. The patient reports that he has used IV drugs in the past that he has also used cocaine. The patient is now living in a group home and comes with his caregiver. He should is not actively drinking any alcohol. He also denies any abdominal pain nausea vomiting fevers or chills. The patient had an ultrasound that was done that showed a 2 cm gallstone without any signs of acute cholecystitis.  Past Medical History:  Diagnosis Date  . Anxiety   . Hypertension   . Seizures (HCC)     History reviewed. No pertinent surgical history.  Prior to Admission medications   Medication Sig Start Date End Date Taking? Authorizing Provider  amLODipine (NORVASC) 10 MG tablet Take 10 mg by mouth every morning.   Yes Historical Provider, MD  benztropine (COGENTIN) 1 MG tablet Take 1 mg by mouth 2 (two) times daily.   Yes Historical Provider, MD  haloperidol (HALDOL) 10 MG tablet Take 10 mg by mouth 2 (two) times daily.   Yes Historical Provider, MD  haloperidol decanoate (HALDOL DECANOATE) 100 MG/ML injection Inject 200 mg into the muscle every 30 (thirty) days.   Yes Historical Provider, MD  hydrochlorothiazide (MICROZIDE) 12.5 MG capsule Take 12.5 mg by mouth every morning.   Yes Historical Provider, MD  levofloxacin (LEVAQUIN) 250 MG tablet Take 1 tablet (250 mg total) by mouth daily. 03/03/15  Yes Enid Baasadhika Kalisetti, MD  lisinopril (PRINIVIL,ZESTRIL) 20 MG tablet Take 20 mg by mouth every morning.   Yes Historical Provider, MD  naproxen (NAPROSYN) 500 MG tablet Take 1 tablet (500 mg total) by mouth  2 (two) times daily with a meal. 12/19/15  Yes Delorise RoyalsJonathan D Cuthriell, PA-C  QUEtiapine (SEROQUEL) 100 MG tablet Take 100-200 mg by mouth 2 (two) times daily. Pt takes two tablets in the morning and one tablet in the evening.   Yes Historical Provider, MD    History reviewed. No pertinent family history.   Social History  Substance Use Topics  . Smoking status: Current Every Day Smoker  . Smokeless tobacco: Never Used  . Alcohol use Yes    Allergies as of 05/12/2016  . (No Known Allergies)    Review of Systems:    All systems reviewed and negative except where noted in HPI.   Physical Exam:  BP 91/61   Pulse 96   Temp 98.2 F (36.8 C) (Oral)   Ht 5\' 7"  (1.702 m)   Wt 179 lb (81.2 kg)   BMI 28.04 kg/m  No LMP for male patient. Psych:  Alert and cooperative. Normal mood and affect. General:   Alert,  Well-developed, well-nourished, pleasant and cooperative in NAD Head:  Normocephalic and atraumatic. Eyes:  Sclera clear, no icterus.   Conjunctiva pink. Ears:  Normal auditory acuity. Nose:  No deformity, discharge, or lesions. Mouth:  No deformity or lesions,oropharynx pink & moist. Neck:  Supple; no masses or thyromegaly. Lungs:  Respirations even and unlabored.  Clear throughout to auscultation.   No wheezes, crackles, or  rhonchi. No acute distress. Heart:  Regular rate and rhythm; no murmurs, clicks, rubs, or gallops. Abdomen:  Normal bowel sounds.  No bruits.  Soft, non-tender and non-distended without masses, hepatosplenomegaly. He was an umbilical hernia noted.  No guarding or rebound tenderness.  Negative Carnett sign.   Rectal:  Deferred.  Msk:  Symmetrical without gross deformities.  Good, equal movement & strength bilaterally. Pulses:  Normal pulses noted. Extremities:  No clubbing or edema.  No cyanosis. Neurologic:  Alert and oriented x3;  grossly normal neurologically. Skin:  Intact without significant lesions or rashes.  No jaundice. Lymph Nodes:  No significant  cervical adenopathy. Psych:  Alert and cooperative. Normal mood and affect.  Imaging Studies: No results found.  Assessment and Plan:   Roberto Vasquez is a 55 y.o. y/o male has a finding on recent blood work showing hepatitis C antibody positive. The patient will have his labs sent off for viral load and genotype. He'll also have labs sent off for other possible causes of liver disease. The patient will then be determine the best treatment for him if his hepatitis C viral load is positive. The patient has been explained the plan and agrees with it.   Note: This dictation was prepared with Dragon dictation along with smaller phrase technology. Any transcriptional errors that result from this process are unintentional.

## 2016-05-15 ENCOUNTER — Telehealth: Payer: Self-pay

## 2016-05-15 NOTE — Telephone Encounter (Signed)
Spoke with caretaker at the pt's group home. He stated I needed to speak with Allen NorrisFrances [place]lington. She is currently out of the office but will be back later today. Will try again after lunch.

## 2016-05-15 NOTE — Telephone Encounter (Signed)
-----   Message from Midge Miniumarren Wohl, MD sent at 05/14/2016  8:09 AM EDT ----- Let the patient know that his liver enzymes are slightly abnormal and he needs a vaccination for hepatitis A and B. We are still waiting for his hepatitis C test to come back.

## 2016-05-19 LAB — MITOCHONDRIAL ANTIBODIES: MITOCHONDRIAL AB: 8.1 U (ref 0.0–20.0)

## 2016-05-19 LAB — CBC WITH DIFFERENTIAL/PLATELET
Basophils Absolute: 0 10*3/uL (ref 0.0–0.2)
Basos: 1 %
EOS (ABSOLUTE): 0 10*3/uL (ref 0.0–0.4)
EOS: 1 %
HEMATOCRIT: 39 % (ref 37.5–51.0)
HEMOGLOBIN: 13.7 g/dL (ref 12.6–17.7)
IMMATURE GRANS (ABS): 0 10*3/uL (ref 0.0–0.1)
Immature Granulocytes: 0 %
Lymphocytes Absolute: 2.3 10*3/uL (ref 0.7–3.1)
Lymphs: 56 %
MCH: 32.2 pg (ref 26.6–33.0)
MCHC: 35.1 g/dL (ref 31.5–35.7)
MCV: 92 fL (ref 79–97)
MONOCYTES: 13 %
MONOS ABS: 0.5 10*3/uL (ref 0.1–0.9)
Neutrophils Absolute: 1.1 10*3/uL — ABNORMAL LOW (ref 1.4–7.0)
Neutrophils: 29 %
Platelets: 181 10*3/uL (ref 150–379)
RBC: 4.25 x10E6/uL (ref 4.14–5.80)
RDW: 12 % — ABNORMAL LOW (ref 12.3–15.4)
WBC: 4 10*3/uL (ref 3.4–10.8)

## 2016-05-19 LAB — HCV RT-PCR, QUANT (NON-GRAPH)

## 2016-05-19 LAB — HEPATITIS A ANTIBODY, TOTAL: Hep A Total Ab: NEGATIVE

## 2016-05-19 LAB — HEPATIC FUNCTION PANEL
ALK PHOS: 83 IU/L (ref 39–117)
ALT: 35 IU/L (ref 0–44)
AST: 46 IU/L — AB (ref 0–40)
Albumin: 4.2 g/dL (ref 3.5–5.5)
BILIRUBIN TOTAL: 0.6 mg/dL (ref 0.0–1.2)
BILIRUBIN, DIRECT: 0.24 mg/dL (ref 0.00–0.40)
Total Protein: 7.8 g/dL (ref 6.0–8.5)

## 2016-05-19 LAB — PROTIME-INR
INR: 1.1 (ref 0.8–1.2)
PROTHROMBIN TIME: 11.2 s (ref 9.1–12.0)

## 2016-05-19 LAB — HEPATITIS C GENOTYPE

## 2016-05-19 LAB — ALPHA-1-ANTITRYPSIN: A1 ANTITRYPSIN: 161 mg/dL (ref 90–200)

## 2016-05-19 LAB — HEPATITIS B SURFACE ANTIBODY,QUALITATIVE: HEP B SURFACE AB, QUAL: NONREACTIVE

## 2016-05-19 LAB — HEPATITIS B SURFACE ANTIGEN: Hepatitis B Surface Ag: NEGATIVE

## 2016-05-19 LAB — CERULOPLASMIN: CERULOPLASMIN: 27.8 mg/dL (ref 16.0–31.0)

## 2016-05-19 LAB — ANA: Anti Nuclear Antibody(ANA): NEGATIVE

## 2016-05-19 LAB — ANTI-SMOOTH MUSCLE ANTIBODY, IGG: SMOOTH MUSCLE AB: 28 U — AB (ref 0–19)

## 2016-05-22 ENCOUNTER — Telehealth: Payer: Self-pay

## 2016-05-22 NOTE — Telephone Encounter (Signed)
Contacted pt's caregiver at the group home, Scarlette CalicoFrances and lab results were given. Pt has also been scheduled [facility], [facility] 5th at 9:30am. Pt is to arrive at 9:00am and to be NPO after midnight Monday night.

## 2016-05-22 NOTE — Telephone Encounter (Signed)
-----   Message from Midge Miniumarren Wohl, MD sent at 05/21/2016  7:39 AM EDT ----- This patient has a genotype of 1A and a viral level that is very high. This patient should be started on treatment once all of his labs are in.

## 2016-05-22 NOTE — Telephone Encounter (Signed)
-----   Message from Darren Wohl, MD sent at 05/14/2016  8:09 AM EDT ----- Let the patient know that his liver enzymes are slightly abnormal and he needs a vaccination for hepatitis A and B. We are still waiting for his hepatitis C test to come back. 

## 2016-05-27 ENCOUNTER — Ambulatory Visit
Admission: RE | Admit: 2016-05-27 | Discharge: 2016-05-27 | Disposition: A | Discharge status: Home / Self Care | Payer: Medicare Other | Source: Ambulatory Visit | Attending: Gastroenterology | Admitting: Gastroenterology

## 2016-05-27 DIAGNOSIS — B182 Chronic viral hepatitis C: Secondary | ICD-10-CM | POA: Insufficient documentation

## 2016-05-30 ENCOUNTER — Telehealth: Payer: Self-pay

## 2016-05-30 NOTE — Telephone Encounter (Signed)
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 The patient's viral level is also extremely high. The patient will need to be started on treatment for his hepatitis C.

## 2016-05-30 NOTE — Telephone Encounter (Signed)
Allen NorrisFrances Vasquez, [facility] [address] [facility] has been notified of results. Paperwork has been completed for pt to start Hep C treatment. Waiting on insurance results.

## 2016-10-19 ENCOUNTER — Observation Stay
Admission: EM | Admit: 2016-10-19 | Discharge: 2016-10-21 | Disposition: A | Discharge status: Home / Self Care | Payer: Medicare Other | Attending: Surgery | Admitting: Surgery

## 2016-10-19 ENCOUNTER — Encounter: Payer: Self-pay | Admitting: Emergency Medicine

## 2016-10-19 ENCOUNTER — Emergency Department: Payer: Medicare Other

## 2016-10-19 DIAGNOSIS — F419 Anxiety disorder, unspecified: Secondary | ICD-10-CM | POA: Insufficient documentation

## 2016-10-19 DIAGNOSIS — B1920 Unspecified viral hepatitis C without hepatic coma: Secondary | ICD-10-CM | POA: Diagnosis not present

## 2016-10-19 DIAGNOSIS — K436 Other and unspecified ventral hernia with obstruction, without gangrene: Secondary | ICD-10-CM | POA: Diagnosis not present

## 2016-10-19 DIAGNOSIS — K450 Other specified abdominal hernia with obstruction, without gangrene: Secondary | ICD-10-CM | POA: Diagnosis present

## 2016-10-19 DIAGNOSIS — I10 Essential (primary) hypertension: Secondary | ICD-10-CM | POA: Diagnosis not present

## 2016-10-19 DIAGNOSIS — F17210 Nicotine dependence, cigarettes, uncomplicated: Secondary | ICD-10-CM | POA: Insufficient documentation

## 2016-10-19 DIAGNOSIS — F209 Schizophrenia, unspecified: Secondary | ICD-10-CM | POA: Diagnosis not present

## 2016-10-19 DIAGNOSIS — Z79899 Other long term (current) drug therapy: Secondary | ICD-10-CM | POA: Insufficient documentation

## 2016-10-19 HISTORY — DX: Schizophrenia, unspecified: F20.9

## 2016-10-19 HISTORY — DX: Unspecified viral hepatitis C without hepatic coma: B19.20

## 2016-10-19 LAB — COMPREHENSIVE METABOLIC PANEL
ALT: 14 U/L — AB (ref 17–63)
AST: 25 U/L (ref 15–41)
Albumin: 4.9 g/dL (ref 3.5–5.0)
Alkaline Phosphatase: 73 U/L (ref 38–126)
Anion gap: 7 (ref 5–15)
BUN: 19 mg/dL (ref 6–20)
CHLORIDE: 101 mmol/L (ref 101–111)
CO2: 27 mmol/L (ref 22–32)
CREATININE: 1.71 mg/dL — AB (ref 0.61–1.24)
Calcium: 9.6 mg/dL (ref 8.9–10.3)
GFR calc Af Amer: 49 mL/min — ABNORMAL LOW (ref >60)
GFR calc non Af Amer: 43 mL/min — ABNORMAL LOW (ref >60)
Glucose, Bld: 101 mg/dL — ABNORMAL HIGH (ref 65–99)
Potassium: 3.6 mmol/L (ref 3.5–5.1)
SODIUM: 135 mmol/L (ref 135–145)
Total Bilirubin: 0.9 mg/dL (ref 0.3–1.2)
Total Protein: 8.9 g/dL — ABNORMAL HIGH (ref 6.5–8.1)

## 2016-10-19 LAB — CBC
HCT: 42.4 % (ref 40.0–52.0)
Hemoglobin: 14.8 g/dL (ref 13.0–18.0)
MCH: 33.2 pg (ref 26.0–34.0)
MCHC: 34.9 g/dL (ref 32.0–36.0)
MCV: 95 fL (ref 80.0–100.0)
PLATELETS: 189 10*3/uL (ref 150–440)
RBC: 4.46 MIL/uL (ref 4.40–5.90)
RDW: 11.6 % (ref 11.5–14.5)
WBC: 7.5 10*3/uL (ref 3.8–10.6)

## 2016-10-19 LAB — URINALYSIS, COMPLETE (UACMP) WITH MICROSCOPIC
Bacteria, UA: NONE SEEN
Bilirubin Urine: NEGATIVE
Glucose, UA: NEGATIVE mg/dL
Hgb urine dipstick: NEGATIVE
KETONES UR: NEGATIVE mg/dL
Leukocytes, UA: NEGATIVE
Nitrite: NEGATIVE
PH: 5 (ref 5.0–8.0)
Protein, ur: NEGATIVE mg/dL
RBC / HPF: NONE SEEN RBC/hpf (ref 0–5)
SPECIFIC GRAVITY, URINE: 1.01 (ref 1.005–1.030)
WBC, UA: NONE SEEN WBC/hpf (ref 0–5)

## 2016-10-19 LAB — LIPASE, BLOOD: LIPASE: 39 U/L (ref 11–51)

## 2016-10-19 MED ORDER — IOPAMIDOL (ISOVUE-300) INJECTION 61%
30.0000 mL | Freq: Once | INTRAVENOUS | Status: AC
  Administered 2016-10-19: 30 mL via ORAL

## 2016-10-19 MED ORDER — IOPAMIDOL (ISOVUE-300) INJECTION 61%
100.0000 mL | Freq: Once | INTRAVENOUS | Status: AC | PRN
  Administered 2016-10-19: 100 mL via INTRAVENOUS

## 2016-10-19 NOTE — ED Provider Notes (Signed)
Time Seen: Approximately *2104  I have reviewed the triage notes  Chief Complaint: Abdominal Pain   History of Present Illness: Roberto Vasquez is a 56 y.o. male who presents with diffuse and generalized abdominal pain and points primarily to the umbilical area. He states he has a known umbilical hernia but points to an area that's left of the umbilicus. He states the pain is relatively constant and he's had issues with constipation. He denies any nausea vomiting or fever. He denies any melena or hematochezia. Her previous history of sepsis and pyelonephritis. No significant surgical history   Past Medical History:  Diagnosis Date  . Anxiety   . Hypertension   . Seizures [facility])     Patient Active Problem List   Diagnosis Date Noted  . Sepsis (HCC) 03/03/2015  . Pyelonephritis 03/01/2015  . Hypokalemia 03/01/2015  . Renal failure 03/01/2015  . HTN (hypertension) 03/01/2015    History reviewed. No pertinent surgical history.  History reviewed. No pertinent surgical history.  Current Outpatient Rx  . Order #: [id number] Class: Historical Med  . Order #: [id number] Class: Historical Med  . Order #: [id number] Class: Historical Med  . Order #: [id number] Class: Historical Med  . Order #: [id number] Class: Historical Med  . Order #: [id number] Class: Normal  . Order #: [id number] Class: Historical Med  . Order #: [id number] Class: Print  . Order #: [id number] Class: Historical Med    Allergies:  Patient has no known allergies.  Family History: No family history on file.  Social History: Social History  Substance Use Topics  . Smoking status: Current Every Day Smoker  . Smokeless tobacco: Never Used  . Alcohol use Yes     Review of Systems:   10 point review of systems was performed and was otherwise negative:  Constitutional: No fever Eyes: No visual disturbances ENT: No sore throat, ear pain Cardiac: No chest pain Respiratory: No shortness of breath, wheezing, or  stridor Abdomen: Abdominal pain described above with constipation. No nausea vomiting diarrhea. Endocrine: No weight loss, No night sweats Extremities: No peripheral edema, cyanosis Skin: No rashes, easy bruising Neurologic: No focal weakness, trouble with speech or swollowing Urologic: No dysuria, Hematuria, or urinary frequency   Physical Exam:  ED Triage Vitals  Enc Vitals Group     BP 10/19/16 1722 125/88     Pulse Rate 10/19/16 1722 (!) 109     Resp 10/19/16 1722 18     Temp 10/19/16 1722 97.9 F (36.6 C)     Temp Source 10/19/16 1722 Oral     SpO2 10/19/16 1722 97 %     Weight 10/19/16 1723 180 lb (81.6 kg)     Height 10/19/16 1723 5\' 11"  (1.803 m)     Head Circumference --      Peak Flow --      Pain Score 10/19/16 1723 10     Pain Loc --      Pain Edu? --      Excl. in GC? --     General: Awake , Alert , and Oriented times 3; GCS 15 Head: Normal cephalic , atraumatic Eyes: Pupils equal , round, reactive to light Nose/Throat: No nasal drainage, patent upper airway without erythema or exudate.  Neck: Supple, Full range of motion, No anterior adenopathy or palpable thyroid masses Lungs: Clear to ascultation without wheezes , rhonchi, or rales Heart: Regular rate, regular rhythm without murmurs , gallops , or rubs Abdomen: Approximately 4 cm. Umbilical hernia that appears to be soft to  palpation I cannot quite reduce it. The tenderness seemed to be left and of the umbilicus. No focal tenderness over McBurney's point negative Murphy sign.      Extremities: 2 plus symmetric pulses. No edema, clubbing or cyanosis Neurologic: normal ambulation, Motor symmetric without deficits, sensory intact Skin: warm, dry, no rashes   Labs:   All laboratory work was reviewed including any pertinent negatives or positives listed below:  Labs Reviewed  COMPREHENSIVE METABOLIC PANEL - Abnormal; Notable for the following:       Result Value   Glucose, Bld 101 (*)    Creatinine, Ser  1.71 (*)    Total Protein 8.9 (*)    ALT 14 (*)    GFR calc non Af Amer 43 (*)    GFR calc Af Amer 49 (*)    All other components within normal limits  URINALYSIS, COMPLETE (UACMP) WITH MICROSCOPIC - Abnormal; Notable for the following:    Color, Urine YELLOW (*)    APPearance CLEAR (*)    Squamous Epithelial / LPF 0-5 (*)    All other components within normal limits  LIPASE, BLOOD  CBC  Laboratory work was reviewed and showed no clinically significant abnormalities.   Radiology:  Abdominal CT is pending I personally reviewed the radiologic studies     ED Course:  The patient's umbilical hernia should be evaluated by CAT scan. I felt was unlikely that he had strangulation or entrapment at this time from a clinical standpoint. His abdominal CT is pending and I spoke to my colleague will follow results of the CAT scan. He may simply just have constipation. He normally has at least one bowel movement per day and is been 4 days since his last bowel movement. He denies any low back pain or difficulty with urination I felt this was unlikely to be neurogenic constipation.     Assessment: *Acute unspecified abdominal pain      Plan: * CT evaluation and observation            Jennye MoccasinBrian S Michaeal Davis, MD 10/19/16 2345

## 2016-10-19 NOTE — ED Triage Notes (Signed)
Pt [facility] ED [facility] c/o generalized abdominal pain.  Patient states he has not had a bowel movement in 4 days.  Denies any fever, N/V/D.  Patient able to ambulate well into triage room and has even and unlabored respirations at this time.  Patient states the abdominal pain was hurting prior to him having the constipation.

## 2016-10-19 NOTE — ED Notes (Signed)
Patient c/o intermittent abdominal pain beginning Thursday with increasing severity today. Pt reports hx of surgery to abdomen (10 years +).

## 2016-10-20 ENCOUNTER — Encounter
Admission: EM | Disposition: A | Discharge status: Home / Self Care | Payer: Self-pay | Source: Home / Self Care | Attending: Emergency Medicine

## 2016-10-20 ENCOUNTER — Observation Stay: Payer: Medicare Other | Admitting: Anesthesiology

## 2016-10-20 ENCOUNTER — Encounter: Payer: Self-pay | Admitting: Surgery

## 2016-10-20 DIAGNOSIS — K429 Umbilical hernia without obstruction or gangrene: Secondary | ICD-10-CM | POA: Diagnosis not present

## 2016-10-20 DIAGNOSIS — K450 Other specified abdominal hernia with obstruction, without gangrene: Secondary | ICD-10-CM

## 2016-10-20 DIAGNOSIS — K436 Other and unspecified ventral hernia with obstruction, without gangrene: Secondary | ICD-10-CM | POA: Diagnosis not present

## 2016-10-20 HISTORY — PX: VENTRAL HERNIA REPAIR: SHX424

## 2016-10-20 LAB — BASIC METABOLIC PANEL
Anion gap: 6 (ref 5–15)
BUN: 16 mg/dL (ref 6–20)
CALCIUM: 8.8 mg/dL — AB (ref 8.9–10.3)
CO2: 25 mmol/L (ref 22–32)
Chloride: 102 mmol/L (ref 101–111)
Creatinine, Ser: 1.24 mg/dL (ref 0.61–1.24)
GFR calc Af Amer: >60 mL/min (ref >60)
GFR calc non Af Amer: >60 mL/min (ref >60)
GLUCOSE: 87 mg/dL (ref 65–99)
Potassium: 3.5 mmol/L (ref 3.5–5.1)
Sodium: 133 mmol/L — ABNORMAL LOW (ref 135–145)

## 2016-10-20 LAB — CBC
HCT: 36.7 % — ABNORMAL LOW (ref 40.0–52.0)
Hemoglobin: 12.9 g/dL — ABNORMAL LOW (ref 13.0–18.0)
MCH: 33.2 pg (ref 26.0–34.0)
MCHC: 35.2 g/dL (ref 32.0–36.0)
MCV: 94.4 fL (ref 80.0–100.0)
PLATELETS: 164 10*3/uL (ref 150–440)
RBC: 3.89 MIL/uL — AB (ref 4.40–5.90)
RDW: 11.6 % (ref 11.5–14.5)
WBC: 5.6 10*3/uL (ref 3.8–10.6)

## 2016-10-20 LAB — MRSA PCR SCREENING: MRSA by PCR: NEGATIVE

## 2016-10-20 LAB — MAGNESIUM: Magnesium: 1.9 mg/dL (ref 1.7–2.4)

## 2016-10-20 SURGERY — REPAIR, HERNIA, VENTRAL
Anesthesia: General | Wound class: Clean

## 2016-10-20 MED ORDER — HYDROMORPHONE HCL 1 MG/ML IJ SOLN: 0.5000 mg | INTRAMUSCULAR | Status: DC | PRN

## 2016-10-20 MED ORDER — LISINOPRIL 20 MG PO TABS
20.0000 mg | ORAL_TABLET | ORAL | Status: DC
  Administered 2016-10-20 – 2016-10-21 (×2): 20 mg via ORAL
  Filled 2016-10-20 (×2): qty 1

## 2016-10-20 MED ORDER — ROCURONIUM BROMIDE 50 MG/5ML IV SOSY
PREFILLED_SYRINGE | INTRAVENOUS | Status: AC
  Filled 2016-10-20: qty 5

## 2016-10-20 MED ORDER — ACETAMINOPHEN 10 MG/ML IV SOLN
INTRAVENOUS | Status: AC
  Filled 2016-10-20: qty 100

## 2016-10-20 MED ORDER — HYDROCHLOROTHIAZIDE 12.5 MG PO CAPS
12.5000 mg | ORAL_CAPSULE | ORAL | Status: DC
  Administered 2016-10-20 – 2016-10-21 (×2): 12.5 mg via ORAL
  Filled 2016-10-20 (×2): qty 1

## 2016-10-20 MED ORDER — PROPOFOL 10 MG/ML IV BOLUS
INTRAVENOUS | Status: DC | PRN
  Administered 2016-10-20: 150 mg via INTRAVENOUS

## 2016-10-20 MED ORDER — ONDANSETRON HCL 4 MG/2ML IJ SOLN
INTRAMUSCULAR | Status: AC
  Filled 2016-10-20: qty 2

## 2016-10-20 MED ORDER — ACETAMINOPHEN 10 MG/ML IV SOLN
INTRAVENOUS | Status: DC | PRN
  Administered 2016-10-20: 1000 mg via INTRAVENOUS

## 2016-10-20 MED ORDER — SUCCINYLCHOLINE CHLORIDE 20 MG/ML IJ SOLN
INTRAMUSCULAR | Status: DC | PRN
Start: 2016-10-20 — End: 2016-10-20
  Administered 2016-10-20: 100 mg via INTRAVENOUS

## 2016-10-20 MED ORDER — QUETIAPINE FUMARATE 100 MG PO TABS
100.0000 mg | ORAL_TABLET | ORAL | Status: DC
  Administered 2016-10-21: 100 mg via ORAL
  Filled 2016-10-20: qty 1

## 2016-10-20 MED ORDER — MIDAZOLAM HCL 2 MG/2ML IJ SOLN
INTRAMUSCULAR | Status: AC
  Filled 2016-10-20: qty 2

## 2016-10-20 MED ORDER — ROCURONIUM BROMIDE 100 MG/10ML IV SOLN
INTRAVENOUS | Status: DC | PRN
  Administered 2016-10-20: 10 mg via INTRAVENOUS

## 2016-10-20 MED ORDER — HEPARIN SODIUM (PORCINE) 5000 UNIT/ML IJ SOLN
5000.0000 [IU] | Freq: Three times a day (TID) | INTRAMUSCULAR | Status: DC
  Administered 2016-10-20 – 2016-10-21 (×4): 5000 [IU] via SUBCUTANEOUS
  Filled 2016-10-20 (×4): qty 1

## 2016-10-20 MED ORDER — LACTATED RINGERS IV SOLN
125.0000 mL/h | INTRAVENOUS | Status: DC
  Administered 2016-10-20 (×3): 125 mL/h via INTRAVENOUS
  Administered 2016-10-20: 10:00:00 via INTRAVENOUS
  Administered 2016-10-21: 125 mL/h via INTRAVENOUS

## 2016-10-20 MED ORDER — SUCCINYLCHOLINE CHLORIDE 200 MG/10ML IV SOSY
PREFILLED_SYRINGE | INTRAVENOUS | Status: AC
  Filled 2016-10-20: qty 10

## 2016-10-20 MED ORDER — HYDROMORPHONE HCL 1 MG/ML IJ SOLN
1.0000 mg | Freq: Once | INTRAMUSCULAR | Status: AC
  Administered 2016-10-20: 1 mg via INTRAVENOUS
  Filled 2016-10-20: qty 1

## 2016-10-20 MED ORDER — BUPIVACAINE-EPINEPHRINE (PF) 0.25% -1:200000 IJ SOLN
INTRAMUSCULAR | Status: DC | PRN
  Administered 2016-10-20: 30 mL

## 2016-10-20 MED ORDER — POLYETHYLENE GLYCOL 3350 17 G PO PACK: 17.0000 g | PACK | Freq: Every day | ORAL | Status: DC | PRN

## 2016-10-20 MED ORDER — FENTANYL CITRATE (PF) 100 MCG/2ML IJ SOLN
INTRAMUSCULAR | Status: DC | PRN
  Administered 2016-10-20: 150 ug via INTRAVENOUS
  Administered 2016-10-20 (×2): 50 ug via INTRAVENOUS

## 2016-10-20 MED ORDER — ONDANSETRON 4 MG PO TBDP: 4.0000 mg | ORAL_TABLET | Freq: Four times a day (QID) | ORAL | Status: DC | PRN

## 2016-10-20 MED ORDER — BENZTROPINE MESYLATE 1 MG PO TABS
1.0000 mg | ORAL_TABLET | Freq: Two times a day (BID) | ORAL | Status: DC
  Administered 2016-10-20 – 2016-10-21 (×3): 1 mg via ORAL
  Filled 2016-10-20: qty 1
  Filled 2016-10-20: qty 2
  Filled 2016-10-20: qty 1

## 2016-10-20 MED ORDER — CEFAZOLIN SODIUM-DEXTROSE 2-4 GM/100ML-% IV SOLN
2.0000 g | INTRAVENOUS | Status: AC
  Administered 2016-10-20: 2 g via INTRAVENOUS

## 2016-10-20 MED ORDER — KETOROLAC TROMETHAMINE 30 MG/ML IJ SOLN
INTRAMUSCULAR | Status: DC | PRN
  Administered 2016-10-20: 30 mg via INTRAVENOUS

## 2016-10-20 MED ORDER — DEXAMETHASONE SODIUM PHOSPHATE 10 MG/ML IJ SOLN
INTRAMUSCULAR | Status: DC | PRN
  Administered 2016-10-20: 10 mg via INTRAVENOUS

## 2016-10-20 MED ORDER — ONDANSETRON HCL 4 MG/2ML IJ SOLN
4.0000 mg | Freq: Four times a day (QID) | INTRAMUSCULAR | Status: DC | PRN
  Administered 2016-10-20: 4 mg via INTRAVENOUS

## 2016-10-20 MED ORDER — FENTANYL CITRATE (PF) 250 MCG/5ML IJ SOLN
INTRAMUSCULAR | Status: AC
  Filled 2016-10-20: qty 5

## 2016-10-20 MED ORDER — LIDOCAINE HCL (CARDIAC) 20 MG/ML IV SOLN
INTRAVENOUS | Status: DC | PRN
  Administered 2016-10-20: 100 mg via INTRAVENOUS

## 2016-10-20 MED ORDER — NALOXONE HCL 0.4 MG/ML IJ SOLN
INTRAMUSCULAR | Status: DC | PRN
  Administered 2016-10-20: 40 ug via INTRAVENOUS

## 2016-10-20 MED ORDER — KETOROLAC TROMETHAMINE 30 MG/ML IJ SOLN: 30.0000 mg | Freq: Four times a day (QID) | INTRAMUSCULAR | Status: DC

## 2016-10-20 MED ORDER — OXYCODONE-ACETAMINOPHEN 5-325 MG PO TABS: 1.0000 | ORAL_TABLET | ORAL | Status: DC | PRN

## 2016-10-20 MED ORDER — SUGAMMADEX SODIUM 200 MG/2ML IV SOLN
INTRAVENOUS | Status: DC | PRN
  Administered 2016-10-20: 161.4 mg via INTRAVENOUS

## 2016-10-20 MED ORDER — DEXAMETHASONE SODIUM PHOSPHATE 10 MG/ML IJ SOLN
INTRAMUSCULAR | Status: AC
  Filled 2016-10-20: qty 1

## 2016-10-20 MED ORDER — OXYCODONE HCL 5 MG PO TABS: 5.0000 mg | ORAL_TABLET | ORAL | Status: DC | PRN

## 2016-10-20 MED ORDER — CEFAZOLIN SODIUM-DEXTROSE 2-4 GM/100ML-% IV SOLN
INTRAVENOUS | Status: AC
  Filled 2016-10-20: qty 100

## 2016-10-20 MED ORDER — MIDAZOLAM HCL 2 MG/2ML IJ SOLN
INTRAMUSCULAR | Status: DC | PRN
  Administered 2016-10-20: 2 mg via INTRAVENOUS

## 2016-10-20 MED ORDER — ENOXAPARIN SODIUM 40 MG/0.4ML ~~LOC~~ SOLN: 40.0000 mg | SUBCUTANEOUS | Status: DC

## 2016-10-20 MED ORDER — PROMETHAZINE HCL 25 MG/ML IJ SOLN: 6.2500 mg | INTRAMUSCULAR | Status: DC | PRN

## 2016-10-20 MED ORDER — HALOPERIDOL 5 MG PO TABS
10.0000 mg | ORAL_TABLET | Freq: Two times a day (BID) | ORAL | Status: DC
  Administered 2016-10-20 – 2016-10-21 (×3): 10 mg via ORAL
  Filled 2016-10-20 (×6): qty 2

## 2016-10-20 MED ORDER — QUETIAPINE FUMARATE 100 MG PO TABS
300.0000 mg | ORAL_TABLET | Freq: Every day | ORAL | Status: DC
  Administered 2016-10-20 (×2): 300 mg via ORAL
  Filled 2016-10-20 (×2): qty 3

## 2016-10-20 MED ORDER — ACETAMINOPHEN 500 MG PO TABS
1000.0000 mg | ORAL_TABLET | Freq: Four times a day (QID) | ORAL | Status: DC
  Administered 2016-10-20 – 2016-10-21 (×4): 1000 mg via ORAL
  Filled 2016-10-20 (×4): qty 2

## 2016-10-20 MED ORDER — PANTOPRAZOLE SODIUM 40 MG IV SOLR
40.0000 mg | Freq: Every day | INTRAVENOUS | Status: DC
  Administered 2016-10-20: 40 mg via INTRAVENOUS
  Filled 2016-10-20 (×2): qty 40

## 2016-10-20 MED ORDER — FENTANYL CITRATE (PF) 100 MCG/2ML IJ SOLN: 25.0000 ug | INTRAMUSCULAR | Status: DC | PRN

## 2016-10-20 MED ORDER — AMLODIPINE BESYLATE 10 MG PO TABS
10.0000 mg | ORAL_TABLET | Freq: Every morning | ORAL | Status: DC
  Administered 2016-10-21: 10 mg via ORAL
  Filled 2016-10-20: qty 1

## 2016-10-20 SURGICAL SUPPLY — 35 items
ADH LQ OCL WTPRF AMP STRL LF (MISCELLANEOUS)
ADHESIVE MASTISOL STRL (MISCELLANEOUS) IMPLANT
BLADE CLIPPER SURG (BLADE) ×3 IMPLANT
BLADE SURG 15 STRL LF DISP TIS (BLADE) ×1 IMPLANT
BLADE SURG 15 STRL SS (BLADE) ×3
CANISTER SUCT 1200ML W/VALVE (MISCELLANEOUS) ×3 IMPLANT
CHLORAPREP W/TINT 26ML (MISCELLANEOUS) ×3 IMPLANT
CLOSURE WOUND 1/2 X4 (GAUZE/BANDAGES/DRESSINGS)
DRAPE LAPAROTOMY 100X77 ABD (DRAPES) ×3 IMPLANT
ELECT REM PT RETURN 9FT ADLT (ELECTROSURGICAL) ×3
ELECTRODE REM PT RTRN 9FT ADLT (ELECTROSURGICAL) ×1 IMPLANT
ETHIBOND 2 0 GREEN CT 2 30IN (SUTURE) IMPLANT
GAUZE SPONGE 4X4 12PLY STRL (GAUZE/BANDAGES/DRESSINGS) ×3 IMPLANT
GLOVE BIO SURGEON STRL SZ8 (GLOVE) ×21 IMPLANT
GOWN STRL REUS W/ TWL LRG LVL3 (GOWN DISPOSABLE) ×2 IMPLANT
GOWN STRL REUS W/TWL LRG LVL3 (GOWN DISPOSABLE) ×6
KIT RM TURNOVER STRD PROC AR (KITS) ×3 IMPLANT
LABEL OR SOLS (LABEL) ×3 IMPLANT
MESH VENTRALEX ST 2.5 CRC MED (Mesh General) ×3 IMPLANT
NDL SAFETY 22GX1.5 (NEEDLE) ×3 IMPLANT
NS IRRIG 500ML POUR BTL (IV SOLUTION) ×3 IMPLANT
PACK BASIN MINOR ARMC (MISCELLANEOUS) ×3 IMPLANT
SPONGE LAP 18X18 5 PK (GAUZE/BANDAGES/DRESSINGS) ×3 IMPLANT
STAPLER SKIN PROX 35W (STAPLE) ×3 IMPLANT
STRIP CLOSURE SKIN 1/2X4 (GAUZE/BANDAGES/DRESSINGS) IMPLANT
SUT MNCRL 4-0 (SUTURE) ×3
SUT MNCRL 4-0 27XMFL (SUTURE) ×1
SUT PROLENE 0 CT 1 30 (SUTURE) ×3 IMPLANT
SUT PROLENE 0 CT 1 CR/8 (SUTURE) ×2 IMPLANT
SUT VIC AB 0 CT2 27 (SUTURE) ×6 IMPLANT
SUT VIC AB 3-0 SH 27 (SUTURE) ×3
SUT VIC AB 3-0 SH 27X BRD (SUTURE) ×1 IMPLANT
SUTURE MNCRL 4-0 27XMF (SUTURE) ×1 IMPLANT
SYR 20CC LL (SYRINGE) ×3 IMPLANT
SYRINGE 10CC LL (SYRINGE) ×3 IMPLANT

## 2016-10-20 NOTE — Care Management (Signed)
Patient is from a family care home on [address] [place] and has been a resident for 4-5 years.  He says he is able to walk and perform his bath.  Patient had elective hernia repair.  Pain under control .  tolerating diet.  Need to mobilize to prevent decline in functional status

## 2016-10-20 NOTE — Clinical Social Work Note (Signed)
Clinical Social Work Assessment  Patient Details  Name: Roberto Vasquez MRN: [id number] Date of Birth: 11/08/1960  Date of referral:  10/20/16               Reason for consult:  Facility Placement                Permission sought to share information with:  Facility Art therapist granted to share information::  Yes, Verbal Permission Granted  Name::        Agency::     Relationship::     Contact Information:     Housing/Transportation Living arrangements for the past 2 months:  Group Home Source of Information:  Patient, Facility Patient Interpreter Needed:  None Criminal Activity/Legal Involvement Pertinent to Current Situation/Hospitalization:  No - Comment as needed Significant Relationships:  Parents Lives with:  Facility Resident Do you feel safe going back to the place where you live?  Yes Need for family participation in patient care:  No (Coment)  Care giving concerns:  Patient is a long term resident at [facility].   Social Worker assessment / plan:  CSW met with patient this afternoon and explained role of CSW and purpose of visit. Patient was very pleasant and stated that he had been at his group home for about 4 years. Patient states that he is okay with living there. He states that he gets along with everyone at the home. He wishes to return there at discharge. CSW contacted Ms. Watlington, the owner of the group home, and she stated that she can take patient back at discharge.   Employment status:  Disabled (Comment on whether or not currently receiving Disability) Insurance information:  Medicare PT Recommendations:    Information / Referral to community resources:     Patient/Family's Response to care:  Patient expressed appreciation for CSW visit.  Patient/Family's Understanding of and Emotional Response to Diagnosis, Current Treatment, and Prognosis:  Patient states he is feeling better now that he has had his procedure.    Emotional Assessment Appearance:  Appears stated age Attitude/Demeanor/Rapport:   (pleasant and cooperative) Affect (typically observed):  Accepting, Calm, Pleasant, Appropriate Orientation:  Oriented to Self, Oriented to Place, Oriented to  Time, Oriented to Situation Alcohol / Substance use:  Not Applicable Psych involvement (Current and /or in the community):  No (Comment)  Discharge Needs  Concerns to be addressed:  Care Coordination Readmission within the last 30 days:  No Current discharge risk:  None Barriers to Discharge:  No Barriers Identified   Shela Leff, LCSW 10/20/2016, 2:55 PM

## 2016-10-20 NOTE — ED Provider Notes (Addendum)
-----------------------------------------   12:07 AM on 10/20/2016 -----------------------------------------  Patient with an incarcerated umbilical hernia. I did attempt to reduce it but patient is having a great of discomfort in that spot. We'll give him pain medications. Did discuss with surgery, they agree with management and will come evaluate the patient.  ----------------------------------------- 12:26 AM on 10/20/2016 -----------------------------------------  Surgery was able to reduce.   Jeanmarie PlantJames A Shanon Seawright, MD 10/20/16 0008    Jeanmarie PlantJames A Cain Fitzhenry, MD 10/20/16 [phone]

## 2016-10-20 NOTE — Progress Notes (Signed)
Preoperative Review   Patient is met in the preoperative holding area. The history is reviewed in the chart and with the patient. I personally reviewed the options and rationale as well as the risks of this procedure that have been previously discussed with the patient. All questions asked by the patient and/or family were answered to their satisfaction.  Patient agrees to proceed with this procedure at this time.  Aishah Teffeteller E Chantee Cerino M.D. FACS  

## 2016-10-20 NOTE — ED Notes (Signed)
Surgical Provider at bedside. 

## 2016-10-20 NOTE — Transfer of Care (Signed)
Immediate Anesthesia Transfer of Care Note  Patient: Roberto Vasquez  Procedure(s) Performed: Procedure(s): HERNIA REPAIR VENTRAL ADULT (N/A)  Patient Location: PACU  Anesthesia Type:General  Level of Consciousness: sedated  Airway & Oxygen Therapy: Patient Spontanous Breathing and Patient connected to face mask oxygen  Post-op Assessment: Report given to RN and Post -op Vital signs reviewed and stable  Post vital signs: Reviewed and stable  Last Vitals:  Vitals:   10/20/16 0817 10/20/16 0934  BP: 104/72 112/82  Pulse: 66 65  Resp:  16  Temp: 36.4 C 36.1 C    Last Pain:  Vitals:   10/20/16 0934  TempSrc: Temporal  PainSc:          Complications: No apparent anesthesia complications

## 2016-10-20 NOTE — Anesthesia Postprocedure Evaluation (Signed)
Anesthesia Post Note  Patient: Roberto Vasquez  Procedure(s) Performed: Procedure(s) (LRB): HERNIA REPAIR VENTRAL ADULT (N/A)  Patient location during evaluation: PACU Anesthesia Type: General Level of consciousness: awake and alert Pain management: pain level controlled Vital Signs Assessment: post-procedure vital signs reviewed and stable Respiratory status: spontaneous breathing, nonlabored ventilation, respiratory function stable and patient connected to nasal cannula oxygen Cardiovascular status: blood pressure returned to baseline and stable Postop Assessment: no signs of nausea or vomiting Anesthetic complications: no     Last Vitals:  Vitals:   10/20/16 1221 10/20/16 1315  BP: 105/71 94/69  Pulse: 61 68  Resp: 12   Temp: 36.3 C 36.3 C    Last Pain:  Vitals:   10/20/16 1315  TempSrc: Oral  PainSc:                  Lenard SimmerAndrew Angeleigh Chiasson

## 2016-10-20 NOTE — H&P (Signed)
Date of Admission:  10/20/2016  Reason for Admission:  Incarcerated periumbilical hernia  History of Present Illness: Roberto Vasquez is a 56 y.o. male sent to the emergency room from his group home due to abdominal pain. He reports that he started having abdominal pain today after going to church and it was not improving. He does have a history of chronic periumbilical hernia that the patient reports he's had for a long time although is unable to tell us exactly for how many years. He reports that the hernia usually bulges and he is able to push it in or notices that he goes in on its own. However he has noted also that it comes out and stays out for a while. The patient reports that this happened a few days prior to this episode but was able to go in after a while. The patient denies any fevers or chills. Reports having nausea but no emesis. Reports having a bowel movement 3 days ago although this is not unusual for him to go every 3 or 4 days. However he did not have any flatus today.   Of note, the patient reports that he has had abdominal surgery in the past but he is not able to tell us what type exactly. The patient is a poor historian.  Past Medical History: Past Medical History:  Diagnosis Date  . Anxiety   . Hepatitis C   . Hypertension   . Schizophrenia (HCC)   . Seizures (HCC)      Past Surgical History: Past Surgical History:  Procedure Laterality Date  . ABDOMINAL SURGERY      Home Medications: Prior to Admission medications   Medication Sig Start Date End Date Taking? Authorizing Provider  amLODipine (NORVASC) 10 MG tablet Take 10 mg by mouth every morning.   Yes Historical Provider, MD  benztropine (COGENTIN) 1 MG tablet Take 1 mg by mouth 2 (two) times daily.   Yes Historical Provider, MD  haloperidol (HALDOL) 10 MG tablet Take 10 mg by mouth 2 (two) times daily.   Yes Historical Provider, MD  hydrochlorothiazide (MICROZIDE) 12.5 MG capsule Take 12.5 mg by mouth  every morning.   Yes Historical Provider, MD  lisinopril (PRINIVIL,ZESTRIL) 20 MG tablet Take 20 mg by mouth every morning.   Yes Historical Provider, MD  QUEtiapine (SEROQUEL) 100 MG tablet Take 100 mg by mouth every morning.   Yes Historical Provider, MD  QUEtiapine (SEROQUEL) 300 MG tablet Take 300 mg by mouth at bedtime.   Yes Historical Provider, MD    Allergies: No Known Allergies  Social History:  reports that he has been smoking.  He has been smoking about 0.50 packs per day. He has never used smokeless tobacco. He has a history of alcohol and drug abuse but the patient reports his been sober for a long time.   Family History: No family history on file.  Review of Systems: Review of Systems  Constitutional: Negative for chills and fever.  HENT: Negative for hearing loss.   Eyes: Negative for blurred vision.  Respiratory: Negative for cough and shortness of breath.   Cardiovascular: Negative for chest pain and leg swelling.  Gastrointestinal: Positive for abdominal pain and nausea. Negative for blood in stool, constipation, diarrhea, heartburn and vomiting.  Genitourinary: Negative for dysuria and hematuria.  Musculoskeletal: Negative for myalgias.  Skin: Negative for rash.  Neurological: Negative for dizziness.  Psychiatric/Behavioral: Negative for depression.  All other systems reviewed and are negative.   Physical Exam  BP 125/89 (BP Location: Left Arm)   Pulse 89   Temp 97.9 F (36.6 C) (Oral)   Resp 18   Ht 5\' 11"  (1.803 m)   Wt 81.6 kg (180 lb)   SpO2 98%   BMI 25.10 kg/m  CONSTITUTIONAL: No acute distress HEENT:  Normocephalic, atraumatic, extraocular motion intact. NECK: Trachea is midline, and there is no jugular venous distension.  RESPIRATORY:  Lungs are clear, and breath sounds are equal bilaterally. Normal respiratory effort without pathologic use of accessory muscles. CARDIOVASCULAR: Heart is regular without murmurs, gallops, or rubs. GI: The abdomen  is soft, nondistended, with mild tenderness to palpation over the patient's supraumbilical hernia. The patient has a hernia defect measuring about 5 cm in diameter. Patient has a long transverse scar in the pelvis. MUSCULOSKELETAL:  Normal muscle strength and tone in all four extremities.  No peripheral edema or cyanosis. SKIN: Skin turgor is normal. There are no pathologic skin lesions.  NEUROLOGIC:  Motor and sensation is grossly normal.  Cranial nerves are grossly intact. PSYCH:  Awake and alert  Laboratory Analysis: Results for orders placed or performed during the hospital encounter of 10/19/16 (from the past 24 hour(s))  Lipase, blood     Status: None   Collection Time: 10/19/16  5:22 PM  Result Value Ref Range   Lipase 39 11 - 51 U/L  Comprehensive metabolic panel     Status: Abnormal   Collection Time: 10/19/16  5:22 PM  Result Value Ref Range   Sodium 135 135 - 145 mmol/L   Potassium 3.6 3.5 - 5.1 mmol/L   Chloride 101 101 - 111 mmol/L   CO2 27 22 - 32 mmol/L   Glucose, Bld 101 (H) 65 - 99 mg/dL   BUN 19 6 - 20 mg/dL   Creatinine, Ser 1.611.71 (H) 0.61 - 1.24 mg/dL   Calcium 9.6 8.9 - 09.610.3 mg/dL   Total Protein 8.9 (H) 6.5 - 8.1 g/dL   Albumin 4.9 3.5 - 5.0 g/dL   AST 25 15 - 41 U/L   ALT 14 (L) 17 - 63 U/L   Alkaline Phosphatase 73 38 - 126 U/L   Total Bilirubin 0.9 0.3 - 1.2 mg/dL   GFR calc non Af Amer 43 (L) >60 mL/min   GFR calc Af Amer 49 (L) >60 mL/min   Anion gap 7 5 - 15  CBC     Status: None   Collection Time: 10/19/16  5:22 PM  Result Value Ref Range   WBC 7.5 3.8 - 10.6 K/uL   RBC 4.46 4.40 - 5.90 MIL/uL   Hemoglobin 14.8 13.0 - 18.0 g/dL   HCT 04.542.4 40.940.0 - 81.152.0 %   MCV 95.0 80.0 - 100.0 fL   MCH 33.2 26.0 - 34.0 pg   MCHC 34.9 32.0 - 36.0 g/dL   RDW 91.411.6 78.211.5 - 95.614.5 %   Platelets 189 150 - 440 K/uL  Urinalysis, Complete w Microscopic     Status: Abnormal   Collection Time: 10/19/16  5:25 PM  Result Value Ref Range   Color, Urine YELLOW (A) YELLOW    APPearance CLEAR (A) CLEAR   Specific Gravity, Urine 1.010 1.005 - 1.030   pH 5.0 5.0 - 8.0   Glucose, UA NEGATIVE NEGATIVE mg/dL   Hgb urine dipstick NEGATIVE NEGATIVE   Bilirubin Urine NEGATIVE NEGATIVE   Ketones, ur NEGATIVE NEGATIVE mg/dL   Protein, ur NEGATIVE NEGATIVE mg/dL   Nitrite NEGATIVE NEGATIVE   Leukocytes, UA NEGATIVE NEGATIVE  RBC / HPF NONE SEEN 0 - 5 RBC/hpf   WBC, UA NONE SEEN 0 - 5 WBC/hpf   Bacteria, UA NONE SEEN NONE SEEN   Squamous Epithelial / LPF 0-5 (A) NONE SEEN   Mucous PRESENT     Imaging: Ct Abdomen Pelvis W Contrast  Result Date: 10/19/2016 CLINICAL DATA:  Generalized abdominal pain. No bowel movement for days. EXAM: CT ABDOMEN AND PELVIS WITH CONTRAST TECHNIQUE: Multidetector CT imaging of the abdomen and pelvis was performed using the standard protocol following bolus administration of intravenous contrast. CONTRAST:  ISOVUE-300 IOPAMIDOL (ISOVUE-300) INJECTION 61% COMPARISON:  03/01/2015 . FINDINGS: Lower chest: Minimal dependent atelectasis. Hepatobiliary: Liver, gallbladder and biliary tree are within normal. Small amount of perihepatic fluid. Pancreas: Within normal. Spleen: Within normal. Adrenals/Urinary Tract: Adrenal glands are normal. Kidneys are normal in size without hydronephrosis or nephrolithiasis. There is a 2.3 cm homogeneously hypodense mass over the upper pole right kidney likely a slightly hyperdense cysts. Ureters and bladder are normal. Stomach/Bowel: Stomach is normal.  Appendix is normal. There is a midline ventral hernia just above the umbilicus containing a short segment of distal ileum with a small amount of fluid adjacent to this bowel within the hernia sac. The ileum immediately proximal to the entrapped segment is dilated to 4.4 cm with mild adjacent inflammatory change. No free peritoneal air. Colon is otherwise within normal. Vascular/Lymphatic: Minimal calcified plaque over the distal abdominal aorta. Remaining vascular  structures are unremarkable. No evidence of adenopathy. Reproductive: Within normal. Other: None. Musculoskeletal: Mild degenerate change of the spine. IMPRESSION: Small midline ventral hernia just above the umbilicus. There is an entrapped/ incarcerated short segment of distal ileum within the hernia sac with adjacent fluid. There is dilatation of the ileum immediately proximal to the entrapped loop measuring 4.4 cm with mild adjacent inflammatory change. 2.3 cm indeterminate lesion over the right renal midpole likely slightly hyperdense cyst. Recommend followup CT pre and post-contrast on an elective basis. Critical Value/emergent results were called by telephone at the time of interpretation on 10/19/2016 at 11:56 pm to Dr. Margarette Asal, who verbally acknowledged these results. Electronically Signed   By: Elberta Fortis M.D.   On: 10/19/2016 23:58    Assessment and Plan: This is a 56 y.o. male who presents with an incarcerated periumbilical hernia. I have personally reviewed the patient's laboratory studies as well as imaging studies.  -After receiving a dose of IV Dilaudid, the patient's periumbilical hernia was able to be successfully reduced. The patient reported symptomatic improvement afterwards. -However given the severity of this episode with indications for developing obstruction on CT scan have discussed with the patient that it would be better to admit him to the hospital for observation and hernia repair today on an semi-elective basis during this admission. Discussed the risks and benefits of the surgical repair. The patient understands that now that his hernia is reduced at bedside, there is no rush to take him to the operating room for surgical management. However it would be prudent to fix his hernia during this admission. The patient understands this plan and all of his questions have been answered. -He will be nothing by mouth with IV fluid hydration and appropriate pain and nausea  control. His home medications will be restarted as well. -The [facility] group home was contacted to update them of the patient's admission. I had a conversation with the caregiver, [name] at [phone].  She would like to be kept updated following his surgery.  Melvyn Neth, [place]

## 2016-10-20 NOTE — ED Notes (Signed)
Called floor to let them know patient on the way 

## 2016-10-20 NOTE — Care Management Obs Status (Signed)
MEDICARE OBSERVATION STATUS NOTIFICATION   Patient Details  Name: Roberto BasquesWalter J Seliga MRN: [id number] Date of Birth: 11-12-1960   Medicare Observation Status Notification Given:  Yes    Eber HongGreene, Deral Schellenberg R, RN 10/20/2016, 2:00 PM

## 2016-10-20 NOTE — Progress Notes (Signed)
This a patient admitted early this morning with incarcerated ventral hernia with bowel present. It was reduced in the emergency room. Patient describes minimal pain at this point no nausea or vomiting.  His history is reviewed and discussed with Dr. Aleen CampiPiscoya.  Vital signs are stable patient appears comfortable Abdomen is soft nondistended nontympanitic and nontender ventral hernia rent barely palpable in the supraumbilical area ours are noted  CT scan is personally reviewed showing a large ventral hernia in the periumbilical area supraumbilical area with incarcerated bowel.  This incarcerated bowel was reduced in the emergency room but he requires repair to prevent this from happening again. I discussed with him the procedure and the rationale for offering surgery at this point and the options of observation. I also discussed the risks of bleeding infection mesh placement mesh infection and cosmetic deformity as well as recurrence he understood and agreed to proceed

## 2016-10-20 NOTE — Anesthesia Post-op Follow-up Note (Cosign Needed)
Anesthesia QCDR form completed.        

## 2016-10-20 NOTE — Anesthesia Procedure Notes (Signed)
Procedure Name: Intubation Date/Time: 10/20/2016 10:26 AM Performed by: Nelda Marseille Pre-anesthesia Checklist: Patient identified, Patient being monitored, Timeout performed, Emergency Drugs available and Suction available Patient Re-evaluated:Patient Re-evaluated prior to inductionOxygen Delivery Method: Circle system utilized Preoxygenation: Pre-oxygenation with 100% oxygen Intubation Type: IV induction Ventilation: Mask ventilation without difficulty Laryngoscope Size: Mac and 4 Grade View: Grade II Tube type: Oral Tube size: 7.5 mm Number of attempts: 1 Airway Equipment and Method: Stylet Placement Confirmation: ETT inserted through vocal cords under direct vision,  positive ETCO2 and breath sounds checked- equal and bilateral Secured at: 21 cm Tube secured with: Tape Dental Injury: Teeth and Oropharynx as per pre-operative assessment  Difficulty Due To: Difficult Airway- due to anterior larynx Comments: Poor dentition.  Many missing and loose teeth.  No probs with intubation.  anterior larynx

## 2016-10-20 NOTE — Anesthesia Preprocedure Evaluation (Signed)
Anesthesia Evaluation  Patient identified by MRN, date of birth, ID band Patient awake    Reviewed: Allergy & Precautions, H&P , NPO status , Patient's Chart, lab work & pertinent test results, reviewed documented beta blocker date and time   History of Anesthesia Complications Negative for: history of anesthetic complications  Airway Mallampati: II  TM Distance: >3 FB Neck ROM: full    Dental  (+) Poor Dentition, Missing   Pulmonary neg shortness of breath, neg sleep apnea, neg COPD, neg recent URI, Current Smoker,           Cardiovascular Exercise Tolerance: Good hypertension, (-) angina(-) CAD, (-) Past MI, (-) Cardiac Stents and (-) CABG (-) dysrhythmias (-) Valvular Problems/Murmurs     Neuro/Psych Seizures -, Well Controlled,  PSYCHIATRIC DISORDERS (Scizophrenia)    GI/Hepatic negative GI ROS, (+) Hepatitis -, C  Endo/Other  negative endocrine ROS  Renal/GU Renal disease  negative genitourinary   Musculoskeletal   Abdominal   Peds  Hematology negative hematology ROS (+)   Anesthesia Other Findings Past Medical History: No date: Anxiety No date: Hepatitis C No date: Hypertension No date: Schizophrenia (HCC) No date: Seizures (HCC)   Reproductive/Obstetrics negative OB ROS                             Anesthesia Physical Anesthesia Plan  ASA: III  Anesthesia Plan: General   Post-op Pain Management:    Induction:   Airway Management Planned:   Additional Equipment:   Intra-op Plan:   Post-operative Plan:   Informed Consent: I have reviewed the patients History and Physical, chart, labs and discussed the procedure including the risks, benefits and alternatives for the proposed anesthesia with the patient or authorized representative who has indicated his/her understanding and acceptance.   Dental Advisory Given  Plan Discussed with: Anesthesiologist, CRNA and  Surgeon  Anesthesia Plan Comments:        Anesthesia Quick Evaluation

## 2016-10-20 NOTE — Op Note (Signed)
Abdominal Hernia Repair  Pre-operative Diagnosis: Incarcerated ventral hernia  Post-operative Diagnosis: Incarcerated ventral hernia  Surgeon: Adah Salvageichard E. Excell Seltzerooper, MD FACS  Anesthesia: Gen. with endotracheal tube  Assistant: PA student  Procedure Details  The patient was seen again in the Holding Room. The benefits, complications, treatment options, and expected outcomes were discussed with the patient. The risks of bleeding, infection, recurrence of symptoms, failure to resolve symptoms, bowel injury, mesh placement, mesh infection, any of which could require further surgery were reviewed with the patient. The likelihood of improving the patient's symptoms with return to their baseline status is good.  The patient and/or family concurred with the proposed plan, giving informed consent.  The patient was taken to Operating Room, identified as Roberto Vasquez and the procedure verified.  A Time Out was held and the above information confirmed.  Prior to the induction of general anesthesia, antibiotic prophylaxis was administered. VTE prophylaxis was in place. General endotracheal anesthesia was then administered and tolerated well. After the induction, the abdomen was prepped with Chloraprep and draped in the sterile fashion. The patient was positioned in the supine position.  A midline incision was utilized to open and explore the subcutaneous tissues in the supraumbilical area. A rather large incarceration of preperitoneal fat was identified. The sac around this preperitoneal fat was opened and there was no bowel within the sac itself. The preperitoneal fat was excised as was the sac. Inspection of the abdominal cavity through approximately 2 cm rent was performed and there was no sign of bowel ischemia or compromise.  A 6.4 cm ventral X mesh with coating was placed into the abdominal cavity and held in with figure-of-eight 0 Prolenes. Additional Marcaine was placed for a total of 30 cc and then  the wound was closed in 3 layers of 0 Vicryl to obliterate dead space. Once assuring hemostasis was adequate the sponge lap needle count was correct staples were placed on the skin edges and a sterile dressing was placed.  Patient thought of this procedure well the workup occasions he was taken to the recovery room in stable condition to be admitted for continued care  Findings:  2 cm rent with incarcerated preperitoneal fat but no bowel involvement at this time.  Estimated Blood Loss: Minimal         Drains: None         Specimens: Hernia sac and preperitoneal fat          Complications: none               Henlee Donovan E. Excell Seltzerooper, MD, FACS

## 2016-10-21 DIAGNOSIS — K436 Other and unspecified ventral hernia with obstruction, without gangrene: Secondary | ICD-10-CM | POA: Diagnosis not present

## 2016-10-21 LAB — SURGICAL PATHOLOGY

## 2016-10-21 MED ORDER — OXYCODONE-ACETAMINOPHEN 5-325 MG PO TABS: 1.0000 | ORAL_TABLET | ORAL | 0 refills | Status: DC | PRN

## 2016-10-21 NOTE — Progress Notes (Signed)
1 Day Post-Op  Subjective: Status post repair of an incarcerated ventral hernia with mesh. Patient has minimal pain and is tolerating a diet.  Objective: Vital signs in last 24 hours: Temp:  [97 F (36.1 C)-98.2 F (36.8 C)] 97.7 F (36.5 C) (01/30 0843) Pulse Rate:  [58-100] 71 (01/30 0843) Resp:  [8-20] 18 (01/30 0843) BP: (94-118)/(69-90) 110/73 (01/30 0843) SpO2:  [96 %-100 %] 100 % (01/30 0843) Weight:  [178 lb (80.7 kg)] 178 lb (80.7 kg) (01/29 0934) Last BM Date: 10/16/16  Intake/Output from previous day: 01/29 0701 - 01/30 0700 In: 2825 [P.O.:1222; I.V.:1603] Out: 505 [Urine:500; Blood:5] Intake/Output this shift: Total I/O In: -  Out: 500 [Urine:500]  Physical exam:  Wound is dressed. Abdomen is soft nontender nontender calves  Lab Results: CBC   Recent Labs  10/19/16 1722 10/20/16 0532  WBC 7.5 5.6  HGB 14.8 12.9*  HCT 42.4 36.7*  PLT 189 164   BMET  Recent Labs  10/19/16 1722 10/20/16 0532  NA 135 133*  K 3.6 3.5  CL 101 102  CO2 27 25  GLUCOSE 101* 87  BUN 19 16  CREATININE 1.71* 1.24  CALCIUM 9.6 8.8*   PT/INR No results for input(s): LABPROT, INR in the last 72 hours. ABG No results for input(s): PHART, HCO3 in the last 72 hours.  Invalid input(s): PCO2, PO2  Studies/Results: Ct Abdomen Pelvis W Contrast  Result Date: 10/19/2016 CLINICAL DATA:  Generalized abdominal pain. No bowel movement for days. EXAM: CT ABDOMEN AND PELVIS WITH CONTRAST TECHNIQUE: Multidetector CT imaging of the abdomen and pelvis was performed using the standard protocol following bolus administration of intravenous contrast. CONTRAST:  100mL ISOVUE-300 IOPAMIDOL (ISOVUE-300) INJECTION 61% COMPARISON:  03/01/2015 . FINDINGS: Lower chest: Minimal dependent atelectasis. Hepatobiliary: Liver, gallbladder and biliary tree are within normal. Small amount of perihepatic fluid. Pancreas: Within normal. Spleen: Within normal. Adrenals/Urinary Tract: Adrenal glands are  normal. Kidneys are normal in size without hydronephrosis or nephrolithiasis. There is a 2.3 cm homogeneously hypodense mass over the upper pole right kidney likely a slightly hyperdense cysts. Ureters and bladder are normal. Stomach/Bowel: Stomach is normal.  Appendix is normal. There is a midline ventral hernia just above the umbilicus containing a short segment of distal ileum with a small amount of fluid adjacent to this bowel within the hernia sac. The ileum immediately proximal to the entrapped segment is dilated to 4.4 cm with mild adjacent inflammatory change. No free peritoneal air. Colon is otherwise within normal. Vascular/Lymphatic: Minimal calcified plaque over the distal abdominal aorta. Remaining vascular structures are unremarkable. No evidence of adenopathy. Reproductive: Within normal. Other: None. Musculoskeletal: Mild degenerate change of the spine. IMPRESSION: Small midline ventral hernia just above the umbilicus. There is an entrapped/ incarcerated short segment of distal ileum within the hernia sac with adjacent fluid. There is dilatation of the ileum immediately proximal to the entrapped loop measuring 4.4 cm with mild adjacent inflammatory change. 2.3 cm indeterminate lesion over the right renal midpole likely slightly hyperdense cyst. Recommend followup CT pre and post-contrast on an elective basis. Critical Value/emergent results were called by telephone at the time of interpretation on 10/19/2016 at 11:56 pm to Dr. Margarette AsalJames Mcshain, who verbally acknowledged these results. Electronically Signed   By: Elberta Fortisaniel  Boyle M.D.   On: 10/19/2016 23:58    Anti-infectives: Anti-infectives    Start     Dose/Rate Route Frequency Ordered Stop   10/20/16 0941  ceFAZolin (ANCEF) 2-4 GM/100ML-% IVPB    Comments:  Buck Mam: cabinet override      10/20/16 0941 10/20/16 1024   10/20/16 0933  ceFAZolin (ANCEF) IVPB 2g/100 mL premix     2 g 200 mL/hr over 30 Minutes Intravenous 30 min pre-op  10/20/16 0933 10/20/16 1024      Assessment/Plan: s/p Procedure(s): HERNIA REPAIR VENTRAL ADULT   Patient doing quite well. We'll discharge today with instructions to follow-up in my office in 10 days. He is given oral analgesics and instructions to remove his dressing tomorrow and shower tomorrow. Staying until seen in the office  Lattie Haw, MD, FACS  10/21/2016

## 2016-10-21 NOTE — Discharge Summary (Signed)
Physician Discharge Summary  Patient ID: Roberto BasquesWalter J Virts MRN: [id number] DOB/AGE: [birth year] 56 y.o.  Admit date: 10/19/2016 Discharge date: 10/21/2016   Discharge Diagnoses:  Active Problems:   Periumbilical hernia   Incarcerated hernia of abdominal cavity   Procedures:Repair of incarcerated ventral hernia with mesh  Hospital Course: This a patient with an incarcerated ventral hernia it is in the supraumbilical area. It was reduced in the emergency room and the patient was moved the hospital and then taken to the operating room urgently for repair. The mesh was placed but no bowel was involved at the time of the surgery. He made a non-, K to postoperative course and is discharged in stable condition with instructions to remove his dressing tomorrow and shower tomorrow and then be seen in the office in 10 days for staple removal.  Consults: None  Disposition: 01-Home or Self Care   Allergies as of 10/21/2016   No Known Allergies     Medication List    TAKE these medications   amLODipine 10 MG tablet Commonly known as:  NORVASC Take 10 mg by mouth every morning.   benztropine 1 MG tablet Commonly known as:  COGENTIN Take 1 mg by mouth 2 (two) times daily.   haloperidol 10 MG tablet Commonly known as:  HALDOL Take 10 mg by mouth 2 (two) times daily.   hydrochlorothiazide 12.5 MG capsule Commonly known as:  MICROZIDE Take 12.5 mg by mouth every morning.   lisinopril 20 MG tablet Commonly known as:  PRINIVIL,ZESTRIL Take 20 mg by mouth every morning.   oxyCODONE-acetaminophen 5-325 MG tablet Commonly known as:  PERCOCET/ROXICET Take 1 tablet by mouth every 4 (four) hours as needed for moderate pain.   QUEtiapine 300 MG tablet Commonly known as:  SEROQUEL Take 300 mg by mouth at bedtime.   QUEtiapine 100 MG tablet Commonly known as:  SEROQUEL Take 100 mg by mouth every morning.      Follow-up Information    Dionne Miloichard Khaleed Holan, MD Follow up in 10 day(s).    Specialty:  Surgery Contact information: [address] [place] [address] [phone]           Lattie Hawichard E Odarius Dines, MD, FACS

## 2016-10-21 NOTE — NC FL2 (Signed)
  [place] MEDICAID FL2 LEVEL OF CARE SCREENING TOOL     IDENTIFICATION  Patient Name: Roberto Vasquez Birthdate: 10/15/1960 Sex: male Admission Date (Current Location): 10/19/2016  [place]   Facility and Address:  [facility], [address], [place], [place] [address]      Provider Number: [id number]  Attending Physician Name and Address:  Henrene DodgeJose Piscoya, MD  Relative Name and Phone Number:       Current Level of Care: Hospital Recommended Level of Care:  (group home) Prior Approval Number:    Date Approved/Denied:   PASRR Number:    Discharge Plan:  (group home)    Current Diagnoses: Patient Active Problem List   Diagnosis Date Noted  . Periumbilical hernia 10/20/2016  . Incarcerated hernia of abdominal cavity   . Sepsis (HCC) 03/03/2015  . Pyelonephritis 03/01/2015  . Hypokalemia 03/01/2015  . Renal failure 03/01/2015  . HTN (hypertension) 03/01/2015    Orientation RESPIRATION BLADDER Height & Weight     Self, Time, Situation, Place  Normal Continent Weight: 178 lb (80.7 kg) Height:  5\' 11"  (180.3 cm)  BEHAVIORAL SYMPTOMS/MOOD NEUROLOGICAL BOWEL NUTRITION STATUS   (none)  (none) Continent Diet (regular)  AMBULATORY STATUS COMMUNICATION OF NEEDS Skin   Independent Verbally Surgical wounds                       Personal Care Assistance Level of Assistance  Bathing, Dressing Bathing Assistance: Independent   Dressing Assistance: Independent     Functional Limitations Info   (no issues)          SPECIAL CARE FACTORS FREQUENCY                       Contractures Contractures Info: Not present    Additional Factors Info                     Medication List    TAKE these medications   amLODipine 10 MG tablet Commonly known as:  NORVASC Take 10 mg by mouth every morning.   benztropine 1 MG tablet Commonly known as:  COGENTIN Take 1 mg by mouth 2 (two) times  daily.   haloperidol 10 MG tablet Commonly known as:  HALDOL Take 10 mg by mouth 2 (two) times daily.   hydrochlorothiazide 12.5 MG capsule Commonly known as:  MICROZIDE Take 12.5 mg by mouth every morning.   lisinopril 20 MG tablet Commonly known as:  PRINIVIL,ZESTRIL Take 20 mg by mouth every morning.   oxyCODONE-acetaminophen 5-325 MG tablet Commonly known as:  PERCOCET/ROXICET Take 1 tablet by mouth every 4 (four) hours as needed for moderate pain.   QUEtiapine 300 MG tablet Commonly known as:  SEROQUEL Take 300 mg by mouth at bedtime.   QUEtiapine 100 MG tablet Commonly known as:  SEROQUEL Take 100 mg by mouth every morning.        York SpanielMonica Aunisty Reali, [place]

## 2016-10-21 NOTE — Progress Notes (Signed)
Pt d/c back to group home today.  IV removed intact.  Rx's given to pt w/all questions and concerns addressed.  D/C paperwork reviewed and education provided with all questions and concerns addressed.  Pt group home caretaker at bedside for home transport.

## 2016-10-21 NOTE — Discharge Instructions (Signed)
Remove dressing in 24 hours. °May shower in 24 hours. ° °Resume all home medications. °Follow-up with Dr. Tishana Clinkenbeard in 10 days. °

## 2016-10-31 ENCOUNTER — Ambulatory Visit (INDEPENDENT_AMBULATORY_CARE_PROVIDER_SITE_OTHER): Payer: Medicare Other | Admitting: Surgery

## 2016-10-31 ENCOUNTER — Encounter: Payer: Self-pay | Admitting: Surgery

## 2016-10-31 VITALS — BP 112/76 | HR 87 | Temp 97.3°F | Ht 67.0 in | Wt 182.4 lb

## 2016-10-31 DIAGNOSIS — K439 Ventral hernia without obstruction or gangrene: Secondary | ICD-10-CM

## 2016-10-31 NOTE — Patient Instructions (Signed)
Please call our office if you have questions or concerns.  We have placed steri strips today. These will begin to fall off in 7-10 days. After showering please be sure to pat them dry.

## 2016-10-31 NOTE — Progress Notes (Signed)
Outpatient postop visit  10/31/2016  Roberto Vasquez is an 56 y.o. male.    Procedure: Repair of incarcerated hernia  CC: No problems  HPI: Patient has no problems concerning his umbilical hernia repair he is minimally tender but eating well and having no problems.  Medications reviewed.    Physical Exam:  BP 112/76   Pulse 87   Temp 97.3 F (36.3 C) (Oral)   Ht 5\' 7"  (1.702 m)   Wt 182 lb 6.4 oz (82.7 kg)   BMI 28.57 kg/m     PE: Wound clean no erythema no drainage staples removed Steri-Strips were placed    Assessment/Plan:  Patient doing very well recommend follow up on an as-needed basis no heavy lifting.  Lattie Hawichard E Naiomi Musto, MD, FACS

## 2016-11-12 ENCOUNTER — Other Ambulatory Visit: Payer: Self-pay | Admitting: Gastroenterology

## 2016-11-12 ENCOUNTER — Other Ambulatory Visit: Payer: Self-pay

## 2016-11-12 ENCOUNTER — Ambulatory Visit (INDEPENDENT_AMBULATORY_CARE_PROVIDER_SITE_OTHER): Payer: Medicare Other | Admitting: Gastroenterology

## 2016-11-12 ENCOUNTER — Encounter: Payer: Self-pay | Admitting: Gastroenterology

## 2016-11-12 VITALS — BP 113/74 | HR 90 | Temp 98.0°F | Ht 67.0 in | Wt 183.0 lb

## 2016-11-12 DIAGNOSIS — B182 Chronic viral hepatitis C: Secondary | ICD-10-CM

## 2016-11-12 NOTE — Progress Notes (Signed)
Primary Care Physician: Sherrie MustacheFayegh Jadali  Primary Gastroenterologist:  Dr. Midge Miniumarren Hollace Michelli  Chief Complaint  Patient presents with  . Follow up Hepatitis C    Treatment completed    HPI: Roberto Vasquez is a 56 y.o. male here for follow-up after completing treatment for his hepatitis C. The patient reports that he had no side effects from the medication and feels fine at the present time.  He also reports that he did not miss any of his doses.  Current Outpatient Prescriptions  Medication Sig Dispense Refill  . amLODipine (NORVASC) 10 MG tablet Take 10 mg by mouth every morning.    . benztropine (COGENTIN) 1 MG tablet Take 1 mg by mouth 2 (two) times daily.    . haloperidol (HALDOL) 10 MG tablet Take 10 mg by mouth 2 (two) times daily.    . hydrochlorothiazide (MICROZIDE) 12.5 MG capsule Take 12.5 mg by mouth every morning.    Marland Kitchen. lisinopril (PRINIVIL,ZESTRIL) 20 MG tablet Take 20 mg by mouth every morning.    Marland Kitchen. oxyCODONE-acetaminophen (PERCOCET/ROXICET) 5-325 MG tablet Take 1 tablet by mouth every 4 (four) hours as needed for moderate pain. 30 tablet 0  . QUEtiapine (SEROQUEL) 100 MG tablet Take 100 mg by mouth every morning.     No current facility-administered medications for this visit.     Allergies as of 11/12/2016  . (No Known Allergies)    ROS:  General: Negative for anorexia, weight loss, fever, chills, fatigue, weakness. ENT: Negative for hoarseness, difficulty swallowing , nasal congestion. CV: Negative for chest pain, angina, palpitations, dyspnea on exertion, peripheral edema.  Respiratory: Negative for dyspnea at rest, dyspnea on exertion, cough, sputum, wheezing.  GI: See history of present illness. GU:  Negative for dysuria, hematuria, urinary incontinence, urinary frequency, nocturnal urination.  Endo: Negative for unusual weight change.    Physical Examination:   BP 113/74   Pulse 90   Temp 98 F (36.7 C) (Oral)   Ht 5\' 7"  (1.702 m)   Wt 183 lb (83 kg)    BMI 28.66 kg/m   General: Well-nourished, well-developed in no acute distress.  Eyes: No icterus. Conjunctivae pink. Mouth: Oropharyngeal mucosa moist and pink , no lesions erythema or exudate. Lungs: Clear to auscultation bilaterally. Non-labored. Heart: Regular rate and rhythm, no murmurs rubs or gallops.  Abdomen: Bowel sounds are normal, nontender, nondistended, no hepatosplenomegaly or masses, no abdominal bruits or hernia , no rebound or guarding.   Extremities: No lower extremity edema. No clubbing or deformities. Neuro: Alert and oriented x 3.  Grossly intact. Skin: Warm and dry, no jaundice.   Psych: Alert and cooperative, normal mood and affect.  Labs:    Imaging Studies: Ct Abdomen Pelvis W Contrast  Result Date: 10/19/2016 CLINICAL DATA:  Generalized abdominal pain. No bowel movement for days. EXAM: CT ABDOMEN AND PELVIS WITH CONTRAST TECHNIQUE: Multidetector CT imaging of the abdomen and pelvis was performed using the standard protocol following bolus administration of intravenous contrast. CONTRAST:  100mL ISOVUE-300 IOPAMIDOL (ISOVUE-300) INJECTION 61% COMPARISON:  03/01/2015 . FINDINGS: Lower chest: Minimal dependent atelectasis. Hepatobiliary: Liver, gallbladder and biliary tree are within normal. Small amount of perihepatic fluid. Pancreas: Within normal. Spleen: Within normal. Adrenals/Urinary Tract: Adrenal glands are normal. Kidneys are normal in size without hydronephrosis or nephrolithiasis. There is a 2.3 cm homogeneously hypodense mass over the upper pole right kidney likely a slightly hyperdense cysts. Ureters and bladder are normal. Stomach/Bowel: Stomach is normal.  Appendix is normal. There is a  midline ventral hernia just above the umbilicus containing a short segment of distal ileum with a small amount of fluid adjacent to this bowel within the hernia sac. The ileum immediately proximal to the entrapped segment is dilated to 4.4 cm with mild adjacent inflammatory  change. No free peritoneal air. Colon is otherwise within normal. Vascular/Lymphatic: Minimal calcified plaque over the distal abdominal aorta. Remaining vascular structures are unremarkable. No evidence of adenopathy. Reproductive: Within normal. Other: None. Musculoskeletal: Mild degenerate change of the spine. IMPRESSION: Small midline ventral hernia just above the umbilicus. There is an entrapped/ incarcerated short segment of distal ileum within the hernia sac with adjacent fluid. There is dilatation of the ileum immediately proximal to the entrapped loop measuring 4.4 cm with mild adjacent inflammatory change. 2.3 cm indeterminate lesion over the right renal midpole likely slightly hyperdense cyst. Recommend followup CT pre and post-contrast on an elective basis. Critical Value/emergent results were called by telephone at the time of interpretation on 10/19/2016 at 11:56 pm to Dr. Margarette Asal, who verbally acknowledged these results. Electronically Signed   By: Elberta Fortis M.D.   On: 10/19/2016 23:58    Assessment and Plan:   Roberto Vasquez is a 56 y.o. y/o male who comes in after treatment for hepatitis C.  The patient will have his viral load checked and his LFTs checked.  The patient will also have these repeated in 6 months in a year.  The patient has been explained the plan and agrees with it    Midge Minium, MD. Clementeen Graham   Note: This dictation was prepared with Dragon dictation along with smaller phrase technology. Any transcriptional errors that result from this process are unintentional.

## 2016-11-21 LAB — HEPATIC FUNCTION PANEL
ALK PHOS: 79 IU/L (ref 39–117)
ALT: 13 IU/L (ref 0–44)
AST: 18 IU/L (ref 0–40)
Albumin: 4.7 g/dL (ref 3.5–5.5)
Bilirubin Total: 0.6 mg/dL (ref 0.0–1.2)
Bilirubin, Direct: 0.16 mg/dL (ref 0.00–0.40)
TOTAL PROTEIN: 8.3 g/dL (ref 6.0–8.5)

## 2016-11-21 LAB — HCV RT-PCR, QUANT (NON-GRAPH)

## 2016-12-01 ENCOUNTER — Telehealth: Payer: Self-pay

## 2016-12-01 NOTE — Telephone Encounter (Signed)
-----   Message from Midge Miniumarren Wohl, MD sent at 11/24/2016  7:57 AM EST ----- Let the patient know the HCV is negative.

## 2016-12-01 NOTE — Telephone Encounter (Signed)
Spoke with pt's caretaker and informed her of lab results.

## 2017-02-20 ENCOUNTER — Encounter: Payer: Self-pay | Admitting: Emergency Medicine

## 2017-02-20 ENCOUNTER — Emergency Department
Admission: EM | Admit: 2017-02-20 | Discharge: 2017-02-21 | Disposition: A | Discharge status: Other Acute Inpatient Hospital | Payer: Medicare Other | Source: Home / Self Care | Attending: Emergency Medicine | Admitting: Emergency Medicine

## 2017-02-20 ENCOUNTER — Emergency Department
Admission: EM | Admit: 2017-02-20 | Discharge: 2017-02-20 | Disposition: A | Discharge status: Other Acute Inpatient Hospital | Payer: Medicare Other | Attending: Emergency Medicine | Admitting: Emergency Medicine

## 2017-02-20 DIAGNOSIS — I10 Essential (primary) hypertension: Secondary | ICD-10-CM | POA: Diagnosis present

## 2017-02-20 DIAGNOSIS — Z79899 Other long term (current) drug therapy: Secondary | ICD-10-CM | POA: Insufficient documentation

## 2017-02-20 DIAGNOSIS — Z91199 Patient's noncompliance with other medical treatment and regimen due to unspecified reason: Secondary | ICD-10-CM

## 2017-02-20 DIAGNOSIS — F209 Schizophrenia, unspecified: Secondary | ICD-10-CM | POA: Diagnosis present

## 2017-02-20 DIAGNOSIS — F203 Undifferentiated schizophrenia: Secondary | ICD-10-CM

## 2017-02-20 DIAGNOSIS — Z9119 Patient's noncompliance with other medical treatment and regimen: Secondary | ICD-10-CM

## 2017-02-20 DIAGNOSIS — F17200 Nicotine dependence, unspecified, uncomplicated: Secondary | ICD-10-CM | POA: Insufficient documentation

## 2017-02-20 LAB — COMPREHENSIVE METABOLIC PANEL
ALBUMIN: 3.6 g/dL (ref 3.5–5.0)
ALT: 16 U/L — ABNORMAL LOW (ref 17–63)
ANION GAP: 7 (ref 5–15)
AST: 25 U/L (ref 15–41)
Alkaline Phosphatase: 61 U/L (ref 38–126)
BUN: 10 mg/dL (ref 6–20)
CALCIUM: 9.1 mg/dL (ref 8.9–10.3)
CHLORIDE: 105 mmol/L (ref 101–111)
CO2: 27 mmol/L (ref 22–32)
Creatinine, Ser: 1.43 mg/dL — ABNORMAL HIGH (ref 0.61–1.24)
GFR calc non Af Amer: 53 mL/min — ABNORMAL LOW (ref >60)
GLUCOSE: 108 mg/dL — AB (ref 65–99)
POTASSIUM: 3.1 mmol/L — AB (ref 3.5–5.1)
SODIUM: 139 mmol/L (ref 135–145)
Total Bilirubin: 0.7 mg/dL (ref 0.3–1.2)
Total Protein: 7.7 g/dL (ref 6.5–8.1)

## 2017-02-20 LAB — URINE DRUG SCREEN, QUALITATIVE (ARMC ONLY)
AMPHETAMINES, UR SCREEN: NOT DETECTED
BARBITURATES, UR SCREEN: NOT DETECTED
BENZODIAZEPINE, UR SCRN: NOT DETECTED
COCAINE METABOLITE, UR ~~LOC~~: NOT DETECTED
Cannabinoid 50 Ng, Ur ~~LOC~~: NOT DETECTED
MDMA (Ecstasy)Ur Screen: NOT DETECTED
METHADONE SCREEN, URINE: NOT DETECTED
Opiate, Ur Screen: NOT DETECTED
Phencyclidine (PCP) Ur S: NOT DETECTED
TRICYCLIC, UR SCREEN: POSITIVE — AB

## 2017-02-20 LAB — CBC
HCT: 36.6 % — ABNORMAL LOW (ref 40.0–52.0)
HEMOGLOBIN: 12.6 g/dL — AB (ref 13.0–18.0)
MCH: 32.4 pg (ref 26.0–34.0)
MCHC: 34.4 g/dL (ref 32.0–36.0)
MCV: 94.2 fL (ref 80.0–100.0)
Platelets: 228 10*3/uL (ref 150–440)
RBC: 3.88 MIL/uL — ABNORMAL LOW (ref 4.40–5.90)
RDW: 12.7 % (ref 11.5–14.5)
WBC: 5.9 10*3/uL (ref 3.8–10.6)

## 2017-02-20 LAB — ACETAMINOPHEN LEVEL

## 2017-02-20 LAB — SALICYLATE LEVEL

## 2017-02-20 LAB — ETHANOL: Alcohol, Ethyl (B): <5 mg/dL (ref <5)

## 2017-02-20 MED ORDER — AMLODIPINE BESYLATE 5 MG PO TABS
10.0000 mg | ORAL_TABLET | Freq: Every day | ORAL | Status: DC
  Administered 2017-02-20 – 2017-02-21 (×2): 10 mg via ORAL
  Filled 2017-02-20: qty 2

## 2017-02-20 MED ORDER — HALOPERIDOL 5 MG PO TABS
ORAL_TABLET | ORAL | Status: AC
  Filled 2017-02-20: qty 2

## 2017-02-20 MED ORDER — HYDROCHLOROTHIAZIDE 12.5 MG PO CAPS
12.5000 mg | ORAL_CAPSULE | Freq: Every day | ORAL | Status: DC
  Administered 2017-02-20 – 2017-02-21 (×2): 12.5 mg via ORAL
  Filled 2017-02-20: qty 1

## 2017-02-20 MED ORDER — HYDROCHLOROTHIAZIDE 12.5 MG PO CAPS
ORAL_CAPSULE | ORAL | Status: AC
  Filled 2017-02-20: qty 1

## 2017-02-20 MED ORDER — LISINOPRIL 20 MG PO TABS
20.0000 mg | ORAL_TABLET | Freq: Every day | ORAL | Status: DC
  Administered 2017-02-20 – 2017-02-21 (×2): 20 mg via ORAL
  Filled 2017-02-20: qty 1

## 2017-02-20 MED ORDER — ZIPRASIDONE MESYLATE 20 MG IM SOLR
20.0000 mg | Freq: Once | INTRAMUSCULAR | Status: AC
  Administered 2017-02-20: 20 mg via INTRAMUSCULAR

## 2017-02-20 MED ORDER — NICOTINE 21 MG/24HR TD PT24: 21.0000 mg | MEDICATED_PATCH | Freq: Once | TRANSDERMAL | Status: DC

## 2017-02-20 MED ORDER — ACETAMINOPHEN 325 MG PO TABS
650.0000 mg | ORAL_TABLET | Freq: Once | ORAL | Status: AC
  Administered 2017-02-20: 650 mg via ORAL
  Filled 2017-02-20: qty 2

## 2017-02-20 MED ORDER — AMLODIPINE BESYLATE 5 MG PO TABS
ORAL_TABLET | ORAL | Status: AC
  Filled 2017-02-20: qty 2

## 2017-02-20 MED ORDER — ZIPRASIDONE MESYLATE 20 MG IM SOLR
INTRAMUSCULAR | Status: AC
  Filled 2017-02-20: qty 20

## 2017-02-20 MED ORDER — LISINOPRIL 10 MG PO TABS
ORAL_TABLET | ORAL | Status: AC
  Filled 2017-02-20: qty 2

## 2017-02-20 MED ORDER — BENZTROPINE MESYLATE 1 MG PO TABS: 1.0000 mg | ORAL_TABLET | Freq: Two times a day (BID) | ORAL | Status: DC | PRN

## 2017-02-20 MED ORDER — QUETIAPINE FUMARATE 300 MG PO TABS
300.0000 mg | ORAL_TABLET | Freq: Every day | ORAL | Status: DC
  Administered 2017-02-20: 300 mg via ORAL
  Filled 2017-02-20: qty 1

## 2017-02-20 MED ORDER — NICOTINE 21 MG/24HR TD PT24
21.0000 mg | MEDICATED_PATCH | Freq: Once | TRANSDERMAL | Status: DC
  Administered 2017-02-20: 21 mg via TRANSDERMAL

## 2017-02-20 MED ORDER — HALOPERIDOL DECANOATE 100 MG/ML IM SOLN
100.0000 mg | Freq: Once | INTRAMUSCULAR | Status: AC
  Administered 2017-02-20: 100 mg via INTRAMUSCULAR
  Filled 2017-02-20: qty 1

## 2017-02-20 MED ORDER — HALOPERIDOL 5 MG PO TABS: 5.0000 mg | ORAL_TABLET | Freq: Four times a day (QID) | ORAL | Status: DC | PRN

## 2017-02-20 MED ORDER — ACETAMINOPHEN 325 MG PO TABS
650.0000 mg | ORAL_TABLET | Freq: Once | ORAL | Status: DC
  Filled 2017-02-20: qty 2

## 2017-02-20 MED ORDER — POTASSIUM CHLORIDE CRYS ER 20 MEQ PO TBCR
40.0000 meq | EXTENDED_RELEASE_TABLET | Freq: Once | ORAL | Status: AC
  Administered 2017-02-20: 40 meq via ORAL
  Filled 2017-02-20: qty 2

## 2017-02-20 MED ORDER — HALOPERIDOL 5 MG PO TABS
10.0000 mg | ORAL_TABLET | Freq: Two times a day (BID) | ORAL | Status: DC
  Administered 2017-02-20 – 2017-02-21 (×3): 10 mg via ORAL
  Filled 2017-02-20 (×2): qty 2

## 2017-02-20 MED ORDER — NICOTINE 21 MG/24HR TD PT24
MEDICATED_PATCH | TRANSDERMAL | Status: AC
  Filled 2017-02-20: qty 1

## 2017-02-20 NOTE — ED Notes (Signed)
Breakfast was given to patient. 

## 2017-02-20 NOTE — ED Provider Notes (Addendum)
[facility] Emergency Department Provider Note  ____________________________________________   I have reviewed the triage vital signs and the nursing notes.   HISTORY  Chief Complaint Psychiatric Evaluation    HPI Roberto Vasquez is a 56 y.o. male who presents today complaining of nothing. He states he feels pretty good. He does have a history of hearing voices he states he has been hearing voices, "good spirits". This is not unusual for him. He was in a group home. He believes he is taking his medications. However, involuntary commitment paperwork were taken out on him by the group home, according to their paper, patient was "threatening staff and other residents" patient denies this. He has no HI or SI, he has no other complaints.     Past Medical History:  Diagnosis Date  . Anxiety   . Hepatitis C   . Hypertension   . Schizophrenia (HCC)   . Seizures [facility])     Patient Active Problem List   Diagnosis Date Noted  . Periumbilical hernia 10/20/2016  . Incarcerated hernia of abdominal cavity   . Sepsis (HCC) 03/03/2015  . Pyelonephritis 03/01/2015  . Hypokalemia 03/01/2015  . Renal failure 03/01/2015  . HTN (hypertension) 03/01/2015    Past Surgical History:  Procedure Laterality Date  . ABDOMINAL SURGERY    . VENTRAL HERNIA REPAIR N/A 10/20/2016   Procedure: HERNIA REPAIR VENTRAL ADULT;  Surgeon: Lattie Haw, MD;  Location: [facility] ORS;  Service: General;  Laterality: N/A;    Prior to Admission medications   Medication Sig Start Date End Date Taking? Authorizing Provider  amLODipine (NORVASC) 10 MG tablet Take 10 mg by mouth every morning.    [provider]  benztropine (COGENTIN) 1 MG tablet Take 1 mg by mouth 2 (two) times daily.    [provider]  haloperidol (HALDOL) 10 MG tablet Take 10 mg by mouth 2 (two) times daily.    [provider]  hydrochlorothiazide (MICROZIDE) 12.5 MG capsule Take 12.5 mg by mouth  every morning.    [provider]  lisinopril (PRINIVIL,ZESTRIL) 20 MG tablet Take 20 mg by mouth every morning.    [provider]  oxyCODONE-acetaminophen (PERCOCET/ROXICET) 5-325 MG tablet Take 1 tablet by mouth every 4 (four) hours as needed for moderate pain. 10/21/16   Lattie Haw, MD  QUEtiapine (SEROQUEL) 100 MG tablet Take 100 mg by mouth every morning.    [provider]    Allergies Patient has no known allergies.  Family History  Problem Relation Age of Onset  . Hypertension Mother     Social History Social History  Substance Use Topics  . Smoking status: Current Every Day Smoker    Packs/day: 0.50  . Smokeless tobacco: Never Used  . Alcohol use No    Review of Systems Constitutional: No fever/chills Eyes: No visual changes. ENT: No sore throat. No stiff neck no neck pain Cardiovascular: Denies chest pain. Respiratory: Denies shortness of breath. Gastrointestinal:   no vomiting.  No diarrhea.  No constipation. Genitourinary: Negative for dysuria. Musculoskeletal: Negative lower extremity swelling Skin: Negative for rash. Neurological: Negative for severe headaches, focal weakness or numbness.   ____________________________________________   PHYSICAL EXAM:  VITAL SIGNS: ED Triage Vitals  Enc Vitals Group     BP 02/20/17 0844 116/76     Pulse Rate 02/20/17 0844 (!) 120     Resp 02/20/17 0844 20     Temp 02/20/17 0844 99.2 F (37.3 C)  Temp Source 02/20/17 0844 Oral     SpO2 02/20/17 0844 100 %     Weight 02/20/17 0842 180 lb (81.6 kg)     Height 02/20/17 0842 6\' 2"  (1.88 m)     Head Circumference --      Peak Flow --      Pain Score --      Pain Loc --      Pain Edu? --      Excl. in GC? --     Constitutional: Alert and oriented. Well appearing and in no acute distress. Eyes: Conjunctivae are normal Head: Atraumatic HEENT: No congestion/rhinnorhea. Mucous membranes are moist.  Oropharynx  non-erythematous Neck:   Nontender with no meningismus, no masses, no stridor Cardiovascular: Normal rate, regular rhythm. Grossly normal heart sounds.  Good peripheral circulation. Respiratory: Normal respiratory effort.  No retractions. Lungs CTAB. Abdominal: Soft and nontender. No distention. No guarding no rebound Back:  There is no focal tenderness or step off.  there is no midline tenderness there are no lesions noted. there is no CVA tenderness Musculoskeletal: No lower extremity tenderness, no upper extremity tenderness. No joint effusions, no DVT signs strong distal pulses no edema Neurologic:  Normal speech and language. No gross focal neurologic deficits are appreciated.  Skin:  Skin is warm, dry and intact. No rash noted. Psychiatric: Mood and affect are slightly bizarre but not overly responding to internal stimuli, will converse with me.Marland Kitchen Speech and behavior are normal.  ____________________________________________   LABS (all labs ordered are listed, but only abnormal results are displayed)  Labs Reviewed  CBC - Abnormal; Notable for the following:       Result Value   RBC 3.88 (*)    Hemoglobin 12.6 (*)    HCT 36.6 (*)    All other components within normal limits  COMPREHENSIVE METABOLIC PANEL  ETHANOL  SALICYLATE LEVEL  ACETAMINOPHEN LEVEL  URINE DRUG SCREEN, QUALITATIVE ([facility] ONLY)   ____________________________________________  EKG  I personally interpreted any EKGs ordered by me or triage  ____________________________________________  RADIOLOGY  I reviewed any imaging ordered by me or triage that were performed during my shift and, if possible, patient and/or family made aware of any abnormal findings. ____________________________________________   PROCEDURES  Procedure(s) performed: None  Procedures  Critical Care performed: None  ____________________________________________   INITIAL IMPRESSION / ASSESSMENT AND PLAN / ED COURSE  Pertinent  labs & imaging results that were available during my care of the patient were reviewed by me and considered in my medical decision making (see chart for details).  Patient here because of allegedly threatening comments, we will have psychiatry and TTS toxidrome however patient has no thoughts of hurting himself or known ulcer at least as he describes it to me.  ----------------------------------------- 12:12 PM on 02/20/2017 -----------------------------------------  Seen and evaluated by Dr. Delaney Meigs, psychiatrist, he feels patient is a imminent danger to self or others and does not meet criteria for acute IVC, he reversed the IVC, patient contracts for safety, we will send him home. Return precautions and follow-up given and understood. We'll give him a booster of his psychiatric medications because apparently was noncompliant for some brief period of time. Patient states he will adhere to therapeutic regimen    ____________________________________________   FINAL CLINICAL IMPRESSION(S) / ED DIAGNOSES  Final diagnoses:  None      This chart was dictated using voice recognition software.  Despite best efforts to proofread,  errors can occur which can change meaning.  Jeanmarie PlantMcShane, Brookes Craine A, MD 02/20/17 [id number]    Jeanmarie PlantMcShane, Dawana Asper A, MD 02/20/17 1213

## 2017-02-20 NOTE — ED Notes (Signed)
Received pt from main ED.  Report given by Danelle EarthlyNoel, RN. Pt wanded by security and placed in room 1.  Pt denies current s/i, h/i or hallucinations.  Pt asked about medical hx and states "I have no idea."  Given fluids, denies he needs a snack. Monitored on 15 minute safety checks.

## 2017-02-20 NOTE — ED Triage Notes (Addendum)
Pt sent from group home on [address]. Pt arrived with [employer] as IVC. Denies SI/HI but does admit to hearing voices. Per [employer] group home staff took out papers because said he was threatening other residents.  Pt denies this. Pt reports taking his meds, [employer] reports group home staff said he has not. Pt c/o headache but difficult to get history

## 2017-02-20 NOTE — ED Notes (Signed)
Pt cleared for DC.

## 2017-02-20 NOTE — ED Notes (Signed)
Pt  ivc  (2nd set)  Pending  Placement.  Pt  Also  Seen  By  Dr  Toni Amendlapacs

## 2017-02-20 NOTE — BH Assessment (Signed)
Assessment Note  Roberto Vasquez is an 56 y.o. male. who reports to the emergency department with involuntary commitment paperwork taken out by group home staff. Documentation stated that the patient has been aggressive with other residents in the home. Patient reports that he's been at this particular group home for approximately four years. He states "they don't do anything for me, all they do is feed me." Patient reports that on May 8th he left the group home unannounced. Patient shared that he has not resided in the home for the past two weeks. He states that he did return to the group home on Wednesday of this week. Patient denies any previous altercations or aggressive behaviors. Patient has a well-documented diagnosis of schizoaffective disorder although the patient denies any mental health history. Patient states that he has been taking medication since back at the group home.Pt. denies any suicidal ideation, plan or intent. Pt. Does endorse the presence of any auditory, stating that are "good spirits."Patient denies and use of any illicit drugs or alcohol.   TTS has contacted Kathalene Frames [facility] representative Scarlette Calico [facility] @[phone])  who states that the patient actually eloped from a day program approximately two Fridays ago. She reports that a staff member noticed him in [facility] on this Monday and brought the patient back to the group home. She also shared that it patient expressed that he had been living under bridges and smoking crack cocaine. She states "I think he is craving." Per her report the patient became verbally aggressive with other residents and staff on yesterday. On this morning he continued exhibiting verbally aggressive behavior. When redirected the patient then walked out of the home cursing and screaming, at that time the staff member proceeded to IVC the client      Diagnosis: Schizophrenia   Past Medical History:  Past Medical History:  Diagnosis Date   . Anxiety   . Hepatitis C   . Hypertension   . Schizophrenia (HCC)   . Seizures (HCC)     Past Surgical History:  Procedure Laterality Date  . ABDOMINAL SURGERY    . VENTRAL HERNIA REPAIR N/A 10/20/2016   Procedure: HERNIA REPAIR VENTRAL ADULT;  Surgeon: Lattie Haw, MD;  Location: [facility] ORS;  Service: General;  Laterality: N/A;    Family History:  Family History  Problem Relation Age of Onset  . Hypertension Mother     Social History:  reports that he has been smoking.  He has been smoking about 0.50 packs per day. He has never used smokeless tobacco. He reports that he does not drink alcohol or use drugs.  Additional Social History:  Alcohol / Drug Use Pain Medications: SEE MAR Prescriptions: SEE MAR Over the Counter: SEE MAR History of alcohol / drug use?: No history of alcohol / drug abuse Longest period of sobriety (when/how long): SEE MAR  CIWA: CIWA-Ar BP: 116/76 Pulse Rate: (!) 120 COWS:    Allergies: No Known Allergies  Home Medications:  (Not in a hospital admission)  OB/GYN Status:  No LMP for male patient.  General Assessment Data Location of Assessment: [facility] ED TTS Assessment: In system Is this a Tele or Face-to-Face Assessment?: Face-to-Face Is this an Initial Assessment or a Re-assessment for this encounter?: Initial Assessment Marital status: Single Living Arrangements: Group Home Can pt return to current living arrangement?: Yes Admission Status: Involuntary Is patient capable of signing voluntary admission?: No Referral Source: Other [facility]) Insurance type: Medicaid   Medical Screening Exam [facility] Walk-in  ONLY) Medical Exam completed: Yes  Crisis Care Plan Living Arrangements: Group Home Legal Guardian: Other: (Unknown ) [facility] of Psychiatrist: [facility]  [facility]   Education Status Is patient currently in school?: No Current Grade: N/A Highest grade of school patient has completed: 10TH Name of school:  N/A Contact person: N/A  Risk to self with the past 6 months Suicidal Ideation: No Has patient been a risk to self within the past 6 months prior to admission? : No Suicidal Intent: No Has patient had any suicidal intent within the past 6 months prior to admission? : No Is patient at risk for suicide?: No Suicidal Plan?: No Has patient had any suicidal plan within the past 6 months prior to admission? : No Access to Means: No What has been your use of drugs/alcohol within the last 12 months?: N Previous Attempts/Gestures: No How many times?: 0 Other Self Harm Risks: 0 Triggers for Past Attempts: Other (Comment) (N/A) Intentional Self Injurious Behavior: None Family Suicide History: No Recent stressful life event(s): Other (Comment) (N/A) Persecutory voices/beliefs?: No Depression: No Depression Symptoms:  (DENIES ) Substance abuse history and/or treatment for substance abuse?: No Suicide prevention information given to non-admitted patients: Not applicable  Risk to Others within the past 6 months Homicidal Ideation: No Does patient have any lifetime risk of violence toward others beyond the six months prior to admission? : No Thoughts of Harm to Others: No Current Homicidal Intent: No Current Homicidal Plan: No Access to Homicidal Means: No Identified Victim: N History of harm to others?: No Assessment of Violence: None Noted Violent Behavior Description: N Does patient have access to weapons?: No Criminal Charges Pending?: No Does patient have a court date: No Is patient on probation?: No  Psychosis Hallucinations: Auditory ("GOOD SPIRITS") Delusions: None noted  Mental Status Report Appearance/Hygiene: In scrubs Eye Contact: Fair Motor Activity: Freedom of movement Speech: Incoherent Level of Consciousness: Alert Mood: Pleasant Affect: Appropriate to circumstance Anxiety Level: None Thought Processes: Relevant Judgement: Partial Orientation: Time, Place,  Person, Situation Obsessive Compulsive Thoughts/Behaviors: None  Cognitive Functioning Concentration: Good Memory: Remote Intact, Recent Intact IQ: Average Insight: Fair Impulse Control: Fair Appetite: Good Weight Loss: 0 Weight Gain: 0 Sleep: Decreased Total Hours of Sleep: 4 Vegetative Symptoms: None  ADLScreening [facility] Assessment [facility]) Patient's cognitive ability adequate to safely complete daily activities?: Yes Patient able to express need for assistance with ADLs?: Yes Independently performs ADLs?: Yes (appropriate for developmental age)  Prior Inpatient Therapy Prior Inpatient Therapy:  (Unable to determine, Pt did deny ) Prior Therapy Dates: N/A Prior Therapy Facilty/Provider(s): N/A Reason for Treatment: N/A  Prior Outpatient Therapy Prior Outpatient Therapy: No Prior Therapy Dates: NONE Prior Therapy Facilty/Provider(s): NONE  Reason for Treatment: N/A Does patient have an ACCT team?: No Does patient have Intensive In-House Services?  : No Does patient have [facility] services? : No Does patient have P4CC services?: No  ADL Screening (condition at time of admission) Patient's cognitive ability adequate to safely complete daily activities?: Yes Patient able to express need for assistance with ADLs?: Yes Independently performs ADLs?: Yes (appropriate for developmental age)       Abuse/Neglect Assessment (Assessment to be complete while patient is alone) Physical Abuse: Denies Verbal Abuse: Denies Sexual Abuse: Denies Exploitation of patient/patient's resources: Denies Self-Neglect: Denies Values / Beliefs Cultural Requests During Hospitalization: None Spiritual Requests During Hospitalization: None Consults Spiritual Care Consult Needed: No Social Work Consult Needed: No Merchant navy officerAdvance Directives (For Healthcare) Does Patient  Have a Medical Advance Directive?: No    Additional Information 1:1 In Past 12 Months?: No CIRT Risk: No Elopement Risk:  No Does patient have medical clearance?: Yes     Disposition:  Disposition Initial Assessment Completed for this Encounter: Yes Disposition of Patient: Other dispositions Other disposition(s): Other (Comment) (Consult with Psy MD)  On Site Evaluation by:   Reviewed with Physician:    Asa Saunas 02/20/2017 11:30 AM

## 2017-02-20 NOTE — ED Provider Notes (Signed)
Based on psychiatry recommendation attempted discharge of the patient. This failed. He became aggressive and started shouting at staff stating that he would kill them. Dr. Toni Amendlapacs has ordered 20 mg of geodon and has filled out commitment papers for him.    Jene EveryKinner, Naida Escalante, MD 02/20/17 [phone]

## 2017-02-20 NOTE — ED Notes (Signed)
Pt arrived on unit via [facility] from ED. Patient dressed in paper scrubs, patient was wand and oriented to the unit. Unit policy and procedures explained. Pt verbalized understanding. Pt verbalized that he was sleeping under a bridge and snakes and bugs were biting him". Pt also verbalized, "that he ran away from his group home because he is not as crazy as the people in their, they are bagging their head and talking to themselves". Pt denies pain, VH, SI, and HI doing this interview process. Pt verbalize AH, stating, "he hears religious voices telling him theme of the bible". Pt is calm and pleasant upon approach. Pt appears stated age. Pt is missing several teeth. No visible signs of open wounds or cuts. No other concerns voiced during the interview process. Will cont to monitor on Q15 safety checks.   Osie RN-BSN

## 2017-02-20 NOTE — ED Notes (Signed)
Pt  ivc  (2nd set)  Pending  Placement.  Pt  Also  Seen  By  Dr  Toni Amendlapacs  PENDING  PLACEMENT

## 2017-02-20 NOTE — Consult Note (Signed)
[facility] Consult   Reason for Consult:  Consult for 56 year old man with schizophrenia seen just earlier today and now back in the emergency room Referring Physician:  Corky Downs Patient Identification: Roberto Vasquez MRN:  [id number] Principal Diagnosis: Schizophrenia [facility]) Diagnosis:   Patient Active Problem List   Diagnosis Date Noted  . Schizophrenia ([place]) [F20.9] 02/20/2017  . Noncompliance [Z91.19] 02/20/2017  . Periumbilical hernia [G29.5] 10/20/2016  . Incarcerated hernia of abdominal cavity [K45.0]   . Sepsis ([place]) [A41.9] 03/03/2015  . Pyelonephritis [N12] 03/01/2015  . Hypokalemia [E87.6] 03/01/2015  . Renal failure [N19] 03/01/2015  . HTN (hypertension) [I10] 03/01/2015    Total Time spent with patient: 30 minutes  Subjective:   JAMORIAN DIMARIA is a 55 y.o. male patient admitted with "I'm not going anywhere with him".  HPI:  Patient reevaluated. This 56 year old man was seen in the emergency room earlier today and at that time was judged to not need hospitalization and was to be discharge. When his ride came to pick him up he became acutely agitated began to express threats became very hostile mood became labile. He was not responding to appropriate verbal redirection. Appeared to be escalating to dangerousness. Clearly it was not possible for him to be discharged as previously thought and he was brought back to the emergency room for reassessment.  Past Psychiatric History: See previous note that the patient has a long history of schizophrenia. Several prior hospitalizations. History of hostility and anger especially when psychotic. Subsequent to his being discharged we were informed by his outpatient treatment team that he probably did not receive his haloperidol decanoate injection last month either making him more behind on his medicine and we had thought  Risk to Self:   Risk to Others:   Prior Inpatient Therapy:   Prior Outpatient Therapy:    Past  Medical History:  Past Medical History:  Diagnosis Date  . Anxiety   . Hepatitis C   . Hypertension   . Schizophrenia ([place])   . Seizures ([place])     Past Surgical History:  Procedure Laterality Date  . ABDOMINAL SURGERY    . VENTRAL HERNIA REPAIR N/A 10/20/2016   Procedure: HERNIA REPAIR VENTRAL ADULT;  Surgeon: Florene Glen, MD;  Location: [facility] ORS;  Service: General;  Laterality: N/A;   Family History:  Family History  Problem Relation Age of Onset  . Hypertension Mother    Family Psychiatric  History: Unknown Social History:  History  Alcohol Use No     History  Drug Use No    Social History   Social History  . Marital status: Married    Spouse name: N/A  . Number of children: N/A  . Years of education: N/A   Social History Main Topics  . Smoking status: Current Every Day Smoker    Packs/day: 0.50  . Smokeless tobacco: Never Used  . Alcohol use No  . Drug use: No  . Sexual activity: Not Asked   Other Topics Concern  . None   Social History Narrative  . None   Additional Social History:    Allergies:  No Known Allergies  Labs:  Results for orders placed or performed during the hospital encounter of 02/20/17 (from the past 48 hour(s))  Comprehensive metabolic panel     Status: Abnormal   Collection Time: 02/20/17  8:42 AM  Result Value Ref Range   Sodium 139 135 - 145 mmol/L   Potassium 3.1 (L) 3.5 - 5.1  mmol/L   Chloride 105 101 - 111 mmol/L   CO2 27 22 - 32 mmol/L   Glucose, Bld 108 (H) 65 - 99 mg/dL   BUN 10 6 - 20 mg/dL   Creatinine, Ser 1.43 (H) 0.61 - 1.24 mg/dL   Calcium 9.1 8.9 - 10.3 mg/dL   Total Protein 7.7 6.5 - 8.1 g/dL   Albumin 3.6 3.5 - 5.0 g/dL   AST 25 15 - 41 U/L   ALT 16 (L) 17 - 63 U/L   Alkaline Phosphatase 61 38 - 126 U/L   Total Bilirubin 0.7 0.3 - 1.2 mg/dL   GFR calc non Af Amer 53 (L) >60 mL/min   GFR calc Af Amer >60 >60 mL/min    Comment: (NOTE) The eGFR has been calculated using the CKD EPI equation. This  calculation has not been validated in all clinical situations. eGFR's persistently <60 mL/min signify possible Chronic Kidney Disease.    Anion gap 7 5 - 15  Ethanol     Status: None   Collection Time: 02/20/17  8:42 AM  Result Value Ref Range   Alcohol, Ethyl (B) <5 <5 mg/dL    Comment:        LOWEST DETECTABLE LIMIT FOR SERUM ALCOHOL IS 5 mg/dL FOR MEDICAL PURPOSES ONLY   Salicylate level     Status: None   Collection Time: 02/20/17  8:42 AM  Result Value Ref Range   Salicylate Lvl <9.4 2.8 - 30.0 mg/dL  Acetaminophen level     Status: Abnormal   Collection Time: 02/20/17  8:42 AM  Result Value Ref Range   Acetaminophen (Tylenol), Serum <10 (L) 10 - 30 ug/mL    Comment:        THERAPEUTIC CONCENTRATIONS VARY SIGNIFICANTLY. A RANGE OF 10-30 ug/mL MAY BE AN EFFECTIVE CONCENTRATION FOR MANY PATIENTS. HOWEVER, SOME ARE BEST TREATED AT CONCENTRATIONS OUTSIDE THIS RANGE. ACETAMINOPHEN CONCENTRATIONS >150 ug/mL AT 4 HOURS AFTER INGESTION AND >50 ug/mL AT 12 HOURS AFTER INGESTION ARE OFTEN ASSOCIATED WITH TOXIC REACTIONS.   cbc     Status: Abnormal   Collection Time: 02/20/17  8:42 AM  Result Value Ref Range   WBC 5.9 3.8 - 10.6 K/uL   RBC 3.88 (L) 4.40 - 5.90 MIL/uL   Hemoglobin 12.6 (L) 13.0 - 18.0 g/dL   HCT 36.6 (L) 40.0 - 52.0 %   MCV 94.2 80.0 - 100.0 fL   MCH 32.4 26.0 - 34.0 pg   MCHC 34.4 32.0 - 36.0 g/dL   RDW 12.7 11.5 - 14.5 %   Platelets 228 150 - 440 K/uL  Urine Drug Screen, Qualitative     Status: Abnormal   Collection Time: 02/20/17  8:42 AM  Result Value Ref Range   Tricyclic, Ur Screen POSITIVE (A) NONE DETECTED   Amphetamines, Ur Screen NONE DETECTED NONE DETECTED   MDMA (Ecstasy)Ur Screen NONE DETECTED NONE DETECTED   Cocaine Metabolite,Ur [place] NONE DETECTED NONE DETECTED   Opiate, Ur Screen NONE DETECTED NONE DETECTED   Phencyclidine (PCP) Ur S NONE DETECTED NONE DETECTED   Cannabinoid 50 Ng, Ur [place] NONE DETECTED NONE DETECTED   Barbiturates, Ur  Screen NONE DETECTED NONE DETECTED   Benzodiazepine, Ur Scrn NONE DETECTED NONE DETECTED   Methadone Scn, Ur NONE DETECTED NONE DETECTED    Comment: (NOTE) 174  Tricyclics, urine               Cutoff 1000 ng/mL 200  Amphetamines, urine  Cutoff 1000 ng/mL 300  MDMA (Ecstasy), urine           Cutoff 500 ng/mL 400  Cocaine Metabolite, urine       Cutoff 300 ng/mL 500  Opiate, urine                   Cutoff 300 ng/mL 600  Phencyclidine (PCP), urine      Cutoff 25 ng/mL 700  Cannabinoid, urine              Cutoff 50 ng/mL 800  Barbiturates, urine             Cutoff 200 ng/mL 900  Benzodiazepine, urine           Cutoff 200 ng/mL 1000 Methadone, urine                Cutoff 300 ng/mL 1100 1200 The urine drug screen provides only a preliminary, unconfirmed 1300 analytical test result and should not be used for non-medical 1400 purposes. Clinical consideration and professional judgment should 1500 be applied to any positive drug screen result due to possible 1600 interfering substances. A more specific alternate chemical method 1700 must be used in order to obtain a confirmed analytical result.  1800 Gas chromato graphy / mass spectrometry (GC/MS) is the preferred 1900 confirmatory method.     Current Facility-Administered Medications  Medication Dose Route Frequency Provider Last Rate Last Dose  . amLODipine (NORVASC) tablet 10 mg  10 mg Oral Daily Vickee Mormino T, MD      . benztropine (COGENTIN) tablet 1 mg  1 mg Oral BID PRN Doreather Hoxworth T, MD      . haloperidol (HALDOL) tablet 10 mg  10 mg Oral BID Mitchelle Sultan T, MD      . haloperidol (HALDOL) tablet 5 mg  5 mg Oral Q6H PRN Shaliah Wann T, MD      . hydrochlorothiazide (MICROZIDE) capsule 12.5 mg  12.5 mg Oral Daily Leniya Breit T, MD      . lisinopril (PRINIVIL,ZESTRIL) tablet 20 mg  20 mg Oral Daily Emoree Sasaki T, MD      . QUEtiapine (SEROQUEL) tablet 300 mg  300 mg Oral QHS Clarence Dunsmore, Madie Reno, MD       Current  Outpatient Prescriptions  Medication Sig Dispense Refill  . amLODipine (NORVASC) 10 MG tablet Take 10 mg by mouth every morning.    . benztropine (COGENTIN) 1 MG tablet Take 1 mg by mouth 2 (two) times daily.    . haloperidol (HALDOL) 10 MG tablet Take 10 mg by mouth 2 (two) times daily.    . hydrochlorothiazide (MICROZIDE) 12.5 MG capsule Take 12.5 mg by mouth every morning.    Marland Kitchen lisinopril (PRINIVIL,ZESTRIL) 20 MG tablet Take 20 mg by mouth every morning.    Marland Kitchen oxyCODONE-acetaminophen (PERCOCET/ROXICET) 5-325 MG tablet Take 1 tablet by mouth every 4 (four) hours as needed for moderate pain. 30 tablet 0  . QUEtiapine (SEROQUEL) 100 MG tablet Take 100 mg by mouth every morning.      Musculoskeletal: Strength & Muscle Tone: within normal limits Gait & Station: normal Patient leans: N/A  Psychiatric Specialty Exam: Physical Exam  Nursing note and vitals reviewed. Constitutional: He appears well-developed and well-nourished.  HENT:  Head: Normocephalic and atraumatic.  Eyes: Conjunctivae are normal. Pupils are equal, round, and reactive to light.  Neck: Normal range of motion.  Cardiovascular: Regular rhythm and normal heart sounds.   Respiratory: Effort normal. No respiratory distress.  GI: Soft.  Musculoskeletal: Normal range of motion.  Neurological: He is alert.  Skin: Skin is warm and dry.  Psychiatric: His affect is angry, labile and inappropriate. His speech is rapid and/or pressured and tangential. He is agitated and aggressive. Thought content is paranoid. Cognition and memory are impaired. He expresses impulsivity and inappropriate judgment. He expresses homicidal ideation. He expresses no suicidal ideation.    Review of Systems  Constitutional: Negative.   HENT: Negative.   Eyes: Negative.   Respiratory: Negative.   Cardiovascular: Negative.   Gastrointestinal: Negative.   Musculoskeletal: Negative.   Skin: Negative.   Neurological: Negative.   Psychiatric/Behavioral:  Positive for hallucinations. Negative for depression, memory loss, substance abuse and suicidal ideas. The patient is not nervous/anxious and does not have insomnia.     Height 6' (1.829 m), weight 81.6 kg (180 lb).Body mass index is 24.41 kg/m.  General Appearance: Disheveled  Eye Contact:  Minimal  Speech:  Garbled, Pressured and Slurred  Volume:  Increased  Mood:  Irritable  Affect:  Inappropriate and Labile  Thought Process:  Disorganized  Orientation:  Full (Time, Place, and Person)  Thought Content:  Illogical, Delusions and Paranoid Ideation  Suicidal Thoughts:  No  Homicidal Thoughts:  Yes.  with intent/plan  Memory:  Immediate;   Fair Recent;   Fair Remote;   Fair  Judgement:  Impaired  Insight:  Lacking  Psychomotor Activity:  Restlessness  Concentration:  Concentration: Poor  Recall:  Poor  Fund of Knowledge:  Fair  Language:  Fair  Akathisia:  No  Handed:  Right  AIMS (if indicated):     Assets:  Financial Resources/Insurance Housing Resilience Social Support  ADL's:  Impaired  Cognition:  Impaired,  Mild  Sleep:        Treatment Plan Summary: Daily contact with patient to assess and evaluate symptoms and progress in treatment, Medication management and Plan Patient decompensated dramatically and very rapidly when we tried to discharge him. Suggest that probably the earlier assessment was not correct. To be more labile agitated and threatening than previously thought. I have re-filed commitment papers based on the change in his mental status. Admission orders completed. Case reviewed with TTS and emergency room physician. He is being given some immediate sedating medicine in the emergency room and once he calms down we will try to proceed towards admission and restarting his outpatient antipsychotic medication.  Disposition: Recommend psychiatric Inpatient admission when medically cleared. Supportive therapy provided about ongoing stressors.  Alethia Berthold,  MD 02/20/2017 3:41 PM

## 2017-02-20 NOTE — ED Notes (Addendum)
TTS has attempted to contact Roberto Vasquez([phone] --[phone])  to gather collateral information, No answer. A message was left for a return call.

## 2017-02-20 NOTE — Discharge Instructions (Signed)
Take your  medications, return to the emergency room for any new or worrisome symptoms

## 2017-02-20 NOTE — Consult Note (Signed)
[place] [facility] Consult   Reason for Consult:  Consult for 56 year old man with a history of schizophrenia brought from his group home with an involuntary commitment alleging he was making threats Referring Physician:  McShane Patient Identification: Roberto Vasquez MRN:  [id number] Principal Diagnosis: Schizophrenia [facility]) Diagnosis:   Patient Active Problem List   Diagnosis Date Noted  . Schizophrenia ([place]) [F20.9] 02/20/2017  . Noncompliance [Z91.19] 02/20/2017  . Periumbilical hernia [M35.3] 10/20/2016  . Incarcerated hernia of abdominal cavity [K45.0]   . Sepsis ([place]) [A41.9] 03/03/2015  . Pyelonephritis [N12] 03/01/2015  . Hypokalemia [E87.6] 03/01/2015  . Renal failure [N19] 03/01/2015  . HTN (hypertension) [I10] 03/01/2015    Total Time spent with patient: 1 hour  Subjective:   Roberto Vasquez is a 56 y.o. male patient admitted with "I have no idea".  HPI:  Patient interviewed. Chart reviewed. TTS has spoken with the group home. Patient admits that he left his day program some time ago may BE as much as a couple weeks ago and went [place] in [place] for a while. He says he did that because he got tired of all of the "crazy people" at the day program who were getting on his nerves. During the time that he was living under the bridge of course he was noncompliant with medicine. Patient denied to [place] having used any substances but apparently the group home has reason to think he may have been using some cocaine at some point. Patient was found and brought back to the group home and has been there a couple days. Reportedly today he got into a verbal altercation with another resident. The group home reports that he was calling the person names and appeared to be hostile and threatening but was not physically assaultive. The patient says that the other resident was staring at him and he just asked him to stop and then walked away. He denies that there was  any kind of threatening behavior involved. He admits that he has chronic auditory hallucinations but says he is untroubled by them. He doesn't have specific other medical complaints at this time. Denies suicidal or homicidal thought.  Social history: Receives disability. He has lived in the same group home on [address] for several years now. Notes in the chart indicate that in the past there was no trouble reported with his living arrangement there. Patient now reports that he doesn't like it there but he also says that his plans are to go back and continue to live there. Denies that he has any intention of running off again. He says that his mother is elderly and he doesn't have much to do with her and that he doesn't have any other family still living that he stays in touch with.  Medical history: Patient has a history of hypertension and also has a history of having had some hernias that required surgical repair recently. He doesn't have any current physical or medical complaints.  Substance abuse history: Patient denies having any alcohol or drug abuse history and there was none reported in the old chart. His alcohol and drug screens are all negative here except for a positive try cyclic's which is probably due to his Seroquel. Group home reports that he had made some comment about possibly using cocaine when he was away from the group home.  Past Psychiatric History: Patient has a history of schizophrenia. Denies past history of suicidal or homicidal behavior. Evidently has been  pretty stable for years. He's had several encounters at the hospital over the last several years for medical issues and psychiatric problems never seem to of been seen as a impediment to treatment. He is on Haldol Decanoate, oral Haldol and oral Seroquel.  Risk to Self: Suicidal Ideation: No Suicidal Intent: No Is patient at risk for suicide?: No Suicidal Plan?: No Access to Means: No What has been your use of  drugs/alcohol within the last 12 months?: N How many times?: 0 Other Self Harm Risks: 0 Triggers for Past Attempts: Other (Comment) (N/A) Intentional Self Injurious Behavior: None Risk to Others: Homicidal Ideation: No Thoughts of Harm to Others: No Current Homicidal Intent: No Current Homicidal Plan: No Access to Homicidal Means: No Identified Victim: N History of harm to others?: No Assessment of Violence: None Noted Violent Behavior Description: N Does patient have access to weapons?: No Criminal Charges Pending?: No Does patient have a court date: No Prior Inpatient Therapy: Prior Inpatient Therapy:  (Unable to determine, Pt did deny ) Prior Therapy Dates: N/A Prior Therapy Facilty/Provider(s): N/A Reason for Treatment: N/A Prior Outpatient Therapy: Prior Outpatient Therapy: No Prior Therapy Dates: NONE Prior Therapy Facilty/Provider(s): NONE  Reason for Treatment: N/A Does patient have an ACCT team?: No Does patient have Intensive In-House Services?  : No Does patient have [facility] services? : No Does patient have P4CC services?: No  Past Medical History:  Past Medical History:  Diagnosis Date  . Anxiety   . Hepatitis C   . Hypertension   . Schizophrenia ([place])   . Seizures ([place])     Past Surgical History:  Procedure Laterality Date  . ABDOMINAL SURGERY    . VENTRAL HERNIA REPAIR N/A 10/20/2016   Procedure: HERNIA REPAIR VENTRAL ADULT;  Surgeon: Florene Glen, MD;  Location: [facility] ORS;  Service: General;  Laterality: N/A;   Family History:  Family History  Problem Relation Age of Onset  . Hypertension Mother    Family Psychiatric  History: None known Social History:  History  Alcohol Use No     History  Drug Use No    Social History   Social History  . Marital status: Married    Spouse name: N/A  . Number of children: N/A  . Years of education: N/A   Social History Main Topics  . Smoking status: Current Every Day Smoker    Packs/day: 0.50  .  Smokeless tobacco: Never Used  . Alcohol use No  . Drug use: No  . Sexual activity: Not Asked   Other Topics Concern  . None   Social History Narrative  . None   Additional Social History:    Allergies:  No Known Allergies  Labs:  Results for orders placed or performed during the hospital encounter of 02/20/17 (from the past 48 hour(s))  Comprehensive metabolic panel     Status: Abnormal   Collection Time: 02/20/17  8:42 AM  Result Value Ref Range   Sodium 139 135 - 145 mmol/L   Potassium 3.1 (L) 3.5 - 5.1 mmol/L   Chloride 105 101 - 111 mmol/L   CO2 27 22 - 32 mmol/L   Glucose, Bld 108 (H) 65 - 99 mg/dL   BUN 10 6 - 20 mg/dL   Creatinine, Ser 1.43 (H) 0.61 - 1.24 mg/dL   Calcium 9.1 8.9 - 10.3 mg/dL   Total Protein 7.7 6.5 - 8.1 g/dL   Albumin 3.6 3.5 - 5.0 g/dL   AST 25 15 - 41  U/L   ALT 16 (L) 17 - 63 U/L   Alkaline Phosphatase 61 38 - 126 U/L   Total Bilirubin 0.7 0.3 - 1.2 mg/dL   GFR calc non Af Amer 53 (L) >60 mL/min   GFR calc Af Amer >60 >60 mL/min    Comment: (NOTE) The eGFR has been calculated using the CKD EPI equation. This calculation has not been validated in all clinical situations. eGFR's persistently <60 mL/min signify possible Chronic Kidney Disease.    Anion gap 7 5 - 15  Ethanol     Status: None   Collection Time: 02/20/17  8:42 AM  Result Value Ref Range   Alcohol, Ethyl (B) <5 <5 mg/dL    Comment:        LOWEST DETECTABLE LIMIT FOR SERUM ALCOHOL IS 5 mg/dL FOR MEDICAL PURPOSES ONLY   Salicylate level     Status: None   Collection Time: 02/20/17  8:42 AM  Result Value Ref Range   Salicylate Lvl <1.6 2.8 - 30.0 mg/dL  Acetaminophen level     Status: Abnormal   Collection Time: 02/20/17  8:42 AM  Result Value Ref Range   Acetaminophen (Tylenol), Serum <10 (L) 10 - 30 ug/mL    Comment:        THERAPEUTIC CONCENTRATIONS VARY SIGNIFICANTLY. A RANGE OF 10-30 ug/mL MAY BE AN EFFECTIVE CONCENTRATION FOR MANY PATIENTS. HOWEVER, SOME ARE  BEST TREATED AT CONCENTRATIONS OUTSIDE THIS RANGE. ACETAMINOPHEN CONCENTRATIONS >150 ug/mL AT 4 HOURS AFTER INGESTION AND >50 ug/mL AT 12 HOURS AFTER INGESTION ARE OFTEN ASSOCIATED WITH TOXIC REACTIONS.   cbc     Status: Abnormal   Collection Time: 02/20/17  8:42 AM  Result Value Ref Range   WBC 5.9 3.8 - 10.6 K/uL   RBC 3.88 (L) 4.40 - 5.90 MIL/uL   Hemoglobin 12.6 (L) 13.0 - 18.0 g/dL   HCT 36.6 (L) 40.0 - 52.0 %   MCV 94.2 80.0 - 100.0 fL   MCH 32.4 26.0 - 34.0 pg   MCHC 34.4 32.0 - 36.0 g/dL   RDW 12.7 11.5 - 14.5 %   Platelets 228 150 - 440 K/uL  Urine Drug Screen, Qualitative     Status: Abnormal   Collection Time: 02/20/17  8:42 AM  Result Value Ref Range   Tricyclic, Ur Screen POSITIVE (A) NONE DETECTED   Amphetamines, Ur Screen NONE DETECTED NONE DETECTED   MDMA (Ecstasy)Ur Screen NONE DETECTED NONE DETECTED   Cocaine Metabolite,Ur [place] NONE DETECTED NONE DETECTED   Opiate, Ur Screen NONE DETECTED NONE DETECTED   Phencyclidine (PCP) Ur S NONE DETECTED NONE DETECTED   Cannabinoid 50 Ng, Ur [place] NONE DETECTED NONE DETECTED   Barbiturates, Ur Screen NONE DETECTED NONE DETECTED   Benzodiazepine, Ur Scrn NONE DETECTED NONE DETECTED   Methadone Scn, Ur NONE DETECTED NONE DETECTED    Comment: (NOTE) 109  Tricyclics, urine               Cutoff 1000 ng/mL 200  Amphetamines, urine             Cutoff 1000 ng/mL 300  MDMA (Ecstasy), urine           Cutoff 500 ng/mL 400  Cocaine Metabolite, urine       Cutoff 300 ng/mL 500  Opiate, urine                   Cutoff 300 ng/mL 600  Phencyclidine (PCP), urine      Cutoff 25 ng/mL  700  Cannabinoid, urine              Cutoff 50 ng/mL 800  Barbiturates, urine             Cutoff 200 ng/mL 900  Benzodiazepine, urine           Cutoff 200 ng/mL 1000 Methadone, urine                Cutoff 300 ng/mL 1100 1200 The urine drug screen provides only a preliminary, unconfirmed 1300 analytical test result and should not be used for  non-medical 1400 purposes. Clinical consideration and professional judgment should 1500 be applied to any positive drug screen result due to possible 1600 interfering substances. A more specific alternate chemical method 1700 must be used in order to obtain a confirmed analytical result.  1800 Gas chromato graphy / mass spectrometry (GC/MS) is the preferred 1900 confirmatory method.     Current Facility-Administered Medications  Medication Dose Route Frequency Provider Last Rate Last Dose  . nicotine (NICODERM CQ - dosed in mg/24 hours) patch 21 mg  21 mg Transdermal Once Schuyler Amor, MD   21 mg at 02/20/17 [id number]   Current Outpatient Prescriptions  Medication Sig Dispense Refill  . amLODipine (NORVASC) 10 MG tablet Take 10 mg by mouth every morning.    . benztropine (COGENTIN) 1 MG tablet Take 1 mg by mouth 2 (two) times daily.    . haloperidol (HALDOL) 10 MG tablet Take 10 mg by mouth 2 (two) times daily.    . hydrochlorothiazide (MICROZIDE) 12.5 MG capsule Take 12.5 mg by mouth every morning.    Marland Kitchen lisinopril (PRINIVIL,ZESTRIL) 20 MG tablet Take 20 mg by mouth every morning.    Marland Kitchen oxyCODONE-acetaminophen (PERCOCET/ROXICET) 5-325 MG tablet Take 1 tablet by mouth every 4 (four) hours as needed for moderate pain. 30 tablet 0  . QUEtiapine (SEROQUEL) 100 MG tablet Take 100 mg by mouth every morning.      Musculoskeletal: Strength & Muscle Tone: within normal limits Gait & Station: normal Patient leans: N/A  Psychiatric Specialty Exam: Physical Exam  Nursing note and vitals reviewed. Constitutional: He appears well-developed and well-nourished.  HENT:  Head: Normocephalic and atraumatic.  Eyes: Conjunctivae are normal. Pupils are equal, round, and reactive to light.  Neck: Normal range of motion.  Cardiovascular: Regular rhythm and normal heart sounds.   Respiratory: Effort normal. No respiratory distress.  GI: Soft.  Musculoskeletal: Normal range of motion.  Neurological:  He is alert.  Skin: Skin is warm and dry.  Psychiatric: His behavior is normal. His affect is blunt. His speech is tangential. Thought content is paranoid. Cognition and memory are impaired. He expresses impulsivity. He expresses no homicidal and no suicidal ideation.    Review of Systems  Constitutional: Negative.   HENT: Negative.   Eyes: Negative.   Respiratory: Negative.   Cardiovascular: Negative.   Gastrointestinal: Negative.   Musculoskeletal: Negative.   Skin: Negative.   Neurological: Negative.   Psychiatric/Behavioral: Positive for hallucinations. Negative for depression, memory loss, substance abuse and suicidal ideas. The patient is not nervous/anxious and does not have insomnia.     Blood pressure 116/76, pulse (!) 120, temperature 99.2 F (37.3 C), temperature source Oral, resp. rate 20, height _0  (1.88 m), weight 81.6 kg (180 lb), SpO2 100 %.Body mass index is 23.11 kg/m.  General Appearance: Casual  Eye Contact:  Good  Speech:  Slow  Volume:  Decreased  Mood:  Euthymic  Affect:  Constricted  Thought Process:  Disorganized  Orientation:  Full (Time, Place, and Person)  Thought Content:  Paranoid Ideation and Rumination  Suicidal Thoughts:  No  Homicidal Thoughts:  No  Memory:  Immediate;   Good Recent;   Fair Remote;   Fair  Judgement:  Fair  Insight:  Shallow  Psychomotor Activity:  Decreased  Concentration:  Concentration: Fair  Recall:  [employer] of Knowledge:  Fair  Language:  Fair  Akathisia:  No  Handed:  Right  AIMS (if indicated):     Assets:  Communication Skills Desire for Improvement Financial Resources/Insurance Housing Physical Health Resilience Social Support  ADL's:  Intact  Cognition:  Impaired,  Mild  Sleep:        Treatment Plan Summary: Medication management and Plan 56 year old gentleman with schizophrenia who left his usual group home for several days and consequently went without his medicine. He's been back at the  group home and back on his medicine for several days. He was brought here with reports that he was threatening to another resident. He denies it but it is certainly possible that he just doesn't have an awareness of how he came across. It does not appear that he is at particularly high risk for harm to himself or others although running away and living [place] bridge is certainly concerning behavior. Ultimately the patient seems to be stable enough to not necessarily require inpatient treatment. He is reporting to me that he is agreeable to staying on his medicine and to living at the group home. We spoke to them and they suggested that he get a booster of his long-acting injectable Haldol, which I think is acceptable since he's been off his medicine. I will go ahead and give him a 50 mg Haldol shot today if he agrees to it. We are told that he is not due for his usual shot until the middle of the month. Patient then can be taken off of IVC and discharged back to his group home. If the running away and other reckless behavior recurs weekend and consider at that point whether inpatient treatment is more appropriate.  Disposition: No evidence of imminent risk to self or others at present.   Patient does not meet criteria for psychiatric inpatient admission. Supportive therapy provided about ongoing stressors.  Alethia Berthold, MD 02/20/2017 11:40 AM

## 2017-02-20 NOTE — ED Notes (Signed)
Per hx pt has Hepatitis C, HTN, NIDDM, and Intellectual disability.  Pt reports he does not know any of his medications or illnesses.

## 2017-02-20 NOTE — ED Triage Notes (Addendum)
[facility]. Patient brought back to room 21. Patient with abusive, derogatory language towards staff. Dr. Toni Amendlapacs states he is in the process of taking IVC papers out on patient. See orders.

## 2017-02-21 ENCOUNTER — Other Ambulatory Visit: Payer: Self-pay | Admitting: Psychiatry

## 2017-02-21 ENCOUNTER — Inpatient Hospital Stay
Admission: AD | Admit: 2017-02-21 | Discharge: 2017-02-26 | DRG: 885 | Disposition: A | Discharge status: Home / Self Care | Payer: Medicare Other | Attending: Psychiatry | Admitting: Psychiatry

## 2017-02-21 DIAGNOSIS — Z8249 Family history of ischemic heart disease and other diseases of the circulatory system: Secondary | ICD-10-CM

## 2017-02-21 DIAGNOSIS — F203 Undifferentiated schizophrenia: Secondary | ICD-10-CM | POA: Diagnosis not present

## 2017-02-21 DIAGNOSIS — I959 Hypotension, unspecified: Secondary | ICD-10-CM | POA: Diagnosis present

## 2017-02-21 DIAGNOSIS — B1920 Unspecified viral hepatitis C without hepatic coma: Secondary | ICD-10-CM | POA: Diagnosis present

## 2017-02-21 DIAGNOSIS — G4700 Insomnia, unspecified: Secondary | ICD-10-CM | POA: Diagnosis present

## 2017-02-21 DIAGNOSIS — Z9114 Patient's other noncompliance with medication regimen: Secondary | ICD-10-CM

## 2017-02-21 DIAGNOSIS — Z9119 Patient's noncompliance with other medical treatment and regimen: Secondary | ICD-10-CM

## 2017-02-21 DIAGNOSIS — I10 Essential (primary) hypertension: Secondary | ICD-10-CM | POA: Diagnosis present

## 2017-02-21 DIAGNOSIS — F17210 Nicotine dependence, cigarettes, uncomplicated: Secondary | ICD-10-CM | POA: Diagnosis present

## 2017-02-21 DIAGNOSIS — F329 Major depressive disorder, single episode, unspecified: Secondary | ICD-10-CM | POA: Diagnosis present

## 2017-02-21 DIAGNOSIS — Z91199 Patient's noncompliance with other medical treatment and regimen due to unspecified reason: Secondary | ICD-10-CM

## 2017-02-21 DIAGNOSIS — F209 Schizophrenia, unspecified: Principal | ICD-10-CM | POA: Diagnosis present

## 2017-02-21 DIAGNOSIS — F17200 Nicotine dependence, unspecified, uncomplicated: Secondary | ICD-10-CM

## 2017-02-21 DIAGNOSIS — Z79899 Other long term (current) drug therapy: Secondary | ICD-10-CM

## 2017-02-21 MED ORDER — HYDROCHLOROTHIAZIDE 12.5 MG PO CAPS
12.5000 mg | ORAL_CAPSULE | Freq: Every day | ORAL | Status: DC
  Administered 2017-02-21 – 2017-02-22 (×2): 12.5 mg via ORAL
  Filled 2017-02-21 (×2): qty 1

## 2017-02-21 MED ORDER — AMLODIPINE BESYLATE 5 MG PO TABS
10.0000 mg | ORAL_TABLET | Freq: Every day | ORAL | Status: DC
Start: 2017-02-21 — End: 2017-02-24
  Administered 2017-02-21 – 2017-02-22 (×2): 10 mg via ORAL
  Filled 2017-02-21 (×3): qty 2

## 2017-02-21 MED ORDER — ACETAMINOPHEN 325 MG PO TABS: 650.0000 mg | ORAL_TABLET | Freq: Four times a day (QID) | ORAL | Status: DC | PRN

## 2017-02-21 MED ORDER — HALOPERIDOL 5 MG PO TABS: 10.0000 mg | ORAL_TABLET | Freq: Two times a day (BID) | ORAL | Status: DC

## 2017-02-21 MED ORDER — HALOPERIDOL 5 MG PO TABS
10.0000 mg | ORAL_TABLET | Freq: Two times a day (BID) | ORAL | Status: DC
  Administered 2017-02-21 – 2017-02-26 (×10): 10 mg via ORAL
  Filled 2017-02-21 (×10): qty 2

## 2017-02-21 MED ORDER — TRAZODONE HCL 100 MG PO TABS: 100.0000 mg | ORAL_TABLET | Freq: Every evening | ORAL | Status: DC | PRN

## 2017-02-21 MED ORDER — HALOPERIDOL 5 MG PO TABS: 5.0000 mg | ORAL_TABLET | Freq: Four times a day (QID) | ORAL | Status: DC | PRN

## 2017-02-21 MED ORDER — TRAZODONE HCL 100 MG PO TABS
100.0000 mg | ORAL_TABLET | Freq: Every evening | ORAL | Status: DC | PRN
  Administered 2017-02-22 – 2017-02-24 (×2): 100 mg via ORAL
  Filled 2017-02-21 (×2): qty 1

## 2017-02-21 MED ORDER — MAGNESIUM HYDROXIDE 400 MG/5ML PO SUSP: 30.0000 mL | Freq: Every day | ORAL | Status: DC | PRN

## 2017-02-21 MED ORDER — ALUM & MAG HYDROXIDE-SIMETH 200-200-20 MG/5ML PO SUSP: 30.0000 mL | ORAL | Status: DC | PRN

## 2017-02-21 MED ORDER — BENZTROPINE MESYLATE 1 MG PO TABS
1.0000 mg | ORAL_TABLET | Freq: Two times a day (BID) | ORAL | Status: DC | PRN
Start: 2017-02-21 — End: 2017-02-23

## 2017-02-21 MED ORDER — QUETIAPINE FUMARATE 200 MG PO TABS
300.0000 mg | ORAL_TABLET | Freq: Every day | ORAL | Status: DC
  Administered 2017-02-21 – 2017-02-24 (×4): 300 mg via ORAL
  Filled 2017-02-21 (×3): qty 1

## 2017-02-21 MED ORDER — HYDROXYZINE HCL 50 MG PO TABS: 50.0000 mg | ORAL_TABLET | Freq: Three times a day (TID) | ORAL | Status: DC | PRN

## 2017-02-21 MED ORDER — LISINOPRIL 20 MG PO TABS
20.0000 mg | ORAL_TABLET | Freq: Every day | ORAL | Status: DC
  Administered 2017-02-21 – 2017-02-22 (×2): 20 mg via ORAL
  Filled 2017-02-21 (×2): qty 1

## 2017-02-21 NOTE — ED Notes (Signed)
Pt awakened for VS reports he slept well.  Denies s/i, h/i or hallucinations this am,.  VSS

## 2017-02-21 NOTE — Plan of Care (Signed)
Problem: Safety: Goal: Ability to remain free from injury will improve Outcome: Progressing Pt remains injury free during hospital stay on admission

## 2017-02-21 NOTE — ED Notes (Signed)
Pt appeared to sleep most of the night while monitored on 15 minute safety checks. 

## 2017-02-21 NOTE — [facility] Group Notes (Signed)
[facility] Group Notes:  (Nursing/MHT/Case Management/Adjunct)  Date:  02/21/2017  Time:  11:25 PM  Type of Therapy:  Psychoeducational Skills  Participation Level:  Did Not Attend   Summary of Progress/Problems:  Roberto MilroyLaquanda Y [name] 02/21/2017, 11:25 PM

## 2017-02-21 NOTE — Plan of Care (Signed)
Problem: Potential for Harm to Self or Others Goal: Ability to function at adequate level Outcome: Not Progressing Patient was observed reclining in bed in a dark room on initial rounds.  He was pleasant and cooperative but refused to attend group with peers.  He came for medications when requested and acknowledged knowing his medications.  He denied thoughts of self harm.  He did not interact with peers.

## 2017-02-21 NOTE — BH Assessment (Signed)
Writer spoke with patient to complete updated assessment. Patient states he is doing better since he received his injection of medication. He continues to voice A/H with some improvement.

## 2017-02-21 NOTE — BH Assessment (Signed)
[facility] by Dr. Toni Amendlapacs.  Attending Physician will be Dr. Ardyth HarpsHernandez.   Patient has [facility] to room 312, [facility] Nurse AlvordPhyllis.   Intake Paper Work has been signed and placed on patient chart.  ER staff is aware of the admission Christen Bame(Ronnie, ER Sect.; Dr. Scotty CourtStafford, ER MD; Vikki PortsValerie Patient's Nurse & Marylene LandAngela, Patient Access).

## 2017-02-21 NOTE — ED Notes (Signed)
Pt has been resting in bed this morning. Pt denies pain, AH,VH,HI, and SI at this time. Pt is medication compliant at this time. No s/sx of distress noted or reported. Pt is calm and pleasant upon approach. Remains on Q 15 mins safety rounds. Will cont to monitor pt.

## 2017-02-21 NOTE — ED Notes (Signed)
Report called and given to Tai, RN. All patients belongings where transferred with patient. Pt denies pain prior to transfer. Vitals signs are stable. Pt transferred by wheelchair with Art gallery manager. No s/sx of distress prior to transfer. Pt denies ah/vh/hi/and si prior to d/c.   Edward Jolly, RN

## 2017-02-21 NOTE — Tx Team (Signed)
Initial Treatment Plan 02/21/2017 4:18 PM Roberto Vasquez Soler [id number]    PATIENT STRESSORS: Medication change or noncompliance   PATIENT STRENGTHS: Ability for insight Communication skills   PATIENT IDENTIFIED PROBLEMS: Aggressive behavior   Labile mood    Medication Non-compliance                  DISCHARGE CRITERIA:  Improved stabilization in mood, thinking, and/or behavior  PRELIMINARY DISCHARGE PLAN: Attend aftercare/continuing care group Outpatient therapy  PATIENT/FAMILY INVOLVEMENT: This treatment plan has been presented to and reviewed with the patient, Roberto Vasquez Decuir, and/or family member,.  The patient and family have been given the opportunity to ask questions and make suggestions.  [name]  Islam Eichinger, RN 02/21/2017, 4:18 PM

## 2017-02-21 NOTE — Progress Notes (Signed)
Pt is a 56 y.o male admitted to [facility] IVC for aggressive and labile behavior. Skin check completed per protocol no bruising, cuts, and wounds found. Search performed thoroughly no contraband found. Pt calm and cooperative during admission process. Pt denies SI, HI, a/v hallucinations. Pt commits to safety on unit. Pt oriented to unit, staff, and activities. No aggressive behaviors noted at this time. Pt vitals is in stable condition no distress noted. Will continue to monitor.

## 2017-02-22 DIAGNOSIS — F203 Undifferentiated schizophrenia: Secondary | ICD-10-CM

## 2017-02-22 LAB — LIPID PANEL
Cholesterol: 138 mg/dL (ref 0–200)
HDL: 36 mg/dL — AB (ref >40)
LDL Cholesterol: 86 mg/dL (ref 0–99)
Total CHOL/HDL Ratio: 3.8 RATIO
Triglycerides: 79 mg/dL (ref <150)
VLDL: 16 mg/dL (ref 0–40)

## 2017-02-22 LAB — TSH: TSH: 0.474 u[IU]/mL (ref 0.350–4.500)

## 2017-02-22 MED ORDER — HALOPERIDOL DECANOATE 100 MG/ML IM SOLN
100.0000 mg | Freq: Once | INTRAMUSCULAR | Status: AC
  Administered 2017-02-22: 100 mg via INTRAMUSCULAR
  Filled 2017-02-22: qty 1

## 2017-02-22 NOTE — Plan of Care (Signed)
Problem: Safety: Goal: Ability to remain free from injury will improve Outcome: Progressing Pt remains injury free

## 2017-02-22 NOTE — H&P (Signed)
Psychiatric Admission Assessment Adult  Patient Identification: Roberto Vasquez MRN:  [id number] Date of Evaluation:  02/22/2017 Chief Complaint:  SCHIZOPHRENIA Principal Diagnosis: <principal problem not specified> Diagnosis:   Patient Active Problem List   Diagnosis Date Noted  . Schizophrenia (HCC) [F20.9] 02/20/2017  . Noncompliance [Z91.19] 02/20/2017  . Periumbilical hernia [K42.9] 10/20/2016  . Incarcerated hernia of abdominal cavity [K45.0]   . Sepsis (HCC) [A41.9] 03/03/2015  . Pyelonephritis [N12] 03/01/2015  . Hypokalemia [E87.6] 03/01/2015  . Renal failure [N19] 03/01/2015  . HTN (hypertension) [I10] 03/01/2015   History of Present Illness: " I want to go my group home" Patient is a 56 year old male with h/o schizophrenia admitted involuntarily. Pt was brought to  emergency room on 6/1 and seen by Dr. Toni Amend and at that time was judged to not need hospitalization and was to be discharged to his group home. When his ride came to pick him up he became acutely agitated began to express threats became very hostile mood became labile. He was not responding to appropriate verbal redirection. Appeared to be escalating to dangerousness. Per report, pt becoming increasingly verbally aggressive towards group home residents and staff. Patient recently eloped from day program.  Pt disorganized, unable to explain why he was agitated. Denies HI at this time. Denies SI.  Associated Signs/Symptoms: Depression Symptoms:  psychomotor agitation, anxiety, (Hypo) Manic Symptoms:  Distractibility, Impulsivity, Irritable Mood, Labiality of Mood, Anxiety Symptoms:  Excessive Worry, Psychotic Symptoms:  paranoid PTSD Symptoms:  Total Time spent with patient: 1 hour  Past Psychiatric History:  long history of schizophrenia. Several prior hospitalizations. Denies past history of suicidal or homicidal behavior. Evidently has been pretty stable for years. He's had several encounters at the  hospital over the last several years for medical issues and psychiatric problems never seem to of been seen as a impediment to treatment. He is on Haldol Decanoate, oral Haldol and oral Seroquel. History of hostility and anger especially when psychotic. Subsequent to his being discharged we were informed by his outpatient treatment team that he probably did not receive his haloperidol decanoate injection last month either making him more behind on his medicine and we had thought  Is the patient at risk to self? No.  Has the patient been a risk to self in the past 6 months? No.  Has the patient been a risk to self within the distant past?  Is the patient a risk to others? Yes.    Has the patient been a risk to others in the past 6 months? Yes.    Has the patient been a risk to others within the distant past? Yes.     Prior Inpatient Therapy:   Prior Outpatient Therapy:    Alcohol Screening: 1. How often do you have a drink containing alcohol?: Never 9. Have you or someone else been injured as a result of your drinking?: No 10. Has a relative or friend or a doctor or another health worker been concerned about your drinking or suggested you cut down?: No Alcohol Use Disorder Identification Test Final Score (AUDIT): 0 Brief Intervention: AUDIT score less than 7 or less-screening does not suggest unhealthy drinking-brief intervention not indicated Substance Abuse History in the last 12 months:  No.  Patient denies having any alcohol or drug abuse history and there was none reported in the old chart. His alcohol and drug screens are all negative here except for a positive try cyclic's which is probably due to his Seroquel. Group  home reports that he had made some comment about possibly using cocaine when he was away from the group home. Consequences of Substance Abuse: NA Previous Psychotropic Medications: Yes , He is on Haldol Decanoate, oral Haldol and oral Seroquel Psychological Evaluations:   Past Medical History:  Past Medical History:  Diagnosis Date  . Anxiety   . Hepatitis C   . Hypertension   . Schizophrenia (HCC)   . Seizures (HCC)     Past Surgical History:  Procedure Laterality Date  . ABDOMINAL SURGERY    . VENTRAL HERNIA REPAIR N/A 10/20/2016   Procedure: HERNIA REPAIR VENTRAL ADULT;  Surgeon: Lattie Hawichard E Cooper, MD;  Location: [facility] ORS;  Service: General;  Laterality: N/A;   Family History:  Family History  Problem Relation Age of Onset  . Hypertension Mother    Family Psychiatric  History: unknown Tobacco Screening:   Social History:  History  Alcohol Use No     History  Drug Use No    Additional Social History: Marital status: Single Are you sexually active?: No What is your sexual orientation?: heterosexual Has your sexual activity been affected by drugs, alcohol, medication, or emotional stress?: n/a Does patient have children?: Yes How many children?: 2 How is patient's relationship with their children?: 2 daughters. Patient states he has a good relationship with his daughters.                          Allergies:  No Known Allergies Lab Results:  Results for orders placed or performed during the hospital encounter of 02/21/17 (from the past 48 hour(s))  Lipid panel     Status: Abnormal   Collection Time: 02/22/17  6:50 AM  Result Value Ref Range   Cholesterol 138 0 - 200 mg/dL   Triglycerides 79 <409<150 mg/dL   HDL 36 (L) >81>40 mg/dL   Total CHOL/HDL Ratio 3.8 RATIO   VLDL 16 0 - 40 mg/dL   LDL Cholesterol 86 0 - 99 mg/dL    Comment:        Total Cholesterol/HDL:CHD Risk Coronary Heart Disease Risk Table                     Men   Women  1/2 Average Risk   3.4   3.3  Average Risk       5.0   4.4  2 X Average Risk   9.6   7.1  3 X Average Risk  23.4   11.0        Use the calculated Patient Ratio above and the CHD Risk Table to determine the patient's CHD Risk.        ATP III CLASSIFICATION (LDL):  <100     mg/dL    Optimal  [id number]  mg/dL   Near or Above                    Optimal  130-159  mg/dL   Borderline  [id number]  mg/dL   High  >308>190     mg/dL   Very High   TSH     Status: None   Collection Time: 02/22/17  6:50 AM  Result Value Ref Range   TSH 0.474 0.350 - 4.500 uIU/mL    Comment: Performed by a 3rd Generation assay with a functional sensitivity of <=0.01 uIU/mL.    Blood Alcohol level:  Lab Results  Component Value Date   [facility]5 02/20[facility]2017  ETH <11 07/17/2011    Metabolic Disorder Labs:  No results found for: HGBA1C, MPG No results found for: PROLACTIN Lab Results  Component Value Date   CHOL 138 02/22/2017   TRIG 79 02/22/2017   HDL 36 (L) 02/22/2017   CHOLHDL 3.8 02/22/2017   VLDL 16 02/22/2017   LDLCALC 86 02/22/2017    Current Medications: Current Facility-Administered Medications  Medication Dose Route Frequency Provider Last Rate Last Dose  . acetaminophen (TYLENOL) tablet 650 mg  650 mg Oral Q6H PRN Clapacs, John T, MD      . alum & mag hydroxide-simeth (MAALOX/MYLANTA) 200-200-20 MG/5ML suspension 30 mL  30 mL Oral Q4H PRN Clapacs, John T, MD      . amLODipine (NORVASC) tablet 10 mg  10 mg Oral Daily Clapacs, Jackquline Denmark, MD   10 mg at 02/22/17 0746  . benztropine (COGENTIN) tablet 1 mg  1 mg Oral BID PRN Clapacs, John T, MD      . haloperidol (HALDOL) tablet 10 mg  10 mg Oral BID Beverly Sessions, MD   10 mg at 02/22/17 0747  . haloperidol (HALDOL) tablet 5 mg  5 mg Oral Q6H PRN Clapacs, John T, MD      . hydrochlorothiazide (MICROZIDE) capsule 12.5 mg  12.5 mg Oral Daily Clapacs, John T, MD   12.5 mg at 02/22/17 0746  . hydrOXYzine (ATARAX/VISTARIL) tablet 50 mg  50 mg Oral TID PRN Clapacs, John T, MD      . lisinopril (PRINIVIL,ZESTRIL) tablet 20 mg  20 mg Oral Daily Clapacs, Jackquline Denmark, MD   20 mg at 02/22/17 0747  . magnesium hydroxide (MILK OF MAGNESIA) suspension 30 mL  30 mL Oral Daily PRN Clapacs, John T, MD      . QUEtiapine (SEROQUEL) tablet 300 mg  300 mg Oral  QHS Clapacs, John T, MD   300 mg at 02/21/17 2026  . traZODone (DESYREL) tablet 100 mg  100 mg Oral QHS PRN Clapacs, Jackquline Denmark, MD       PTA Medications: Prescriptions Prior to Admission  Medication Sig Dispense Refill Last Dose  . amLODipine (NORVASC) 10 MG tablet Take 10 mg by mouth every morning.   unknown at unknown  . benztropine (COGENTIN) 1 MG tablet Take 1 mg by mouth 2 (two) times daily.   unknown at Unknown time  . haloperidol (HALDOL) 10 MG tablet Take 10 mg by mouth 2 (two) times daily.   unknown at Unknown time  . haloperidol (HALDOL) 5 MG tablet Take 5 mg by mouth daily as needed for agitation.   prn at prn  . haloperidol decanoate (HALDOL DECANOATE) 100 MG/ML injection Inject 100 mg into the muscle every 28 (twenty-eight) days.   unknown at unknown  . hydrochlorothiazide (MICROZIDE) 12.5 MG capsule Take 12.5 mg by mouth every morning.   unknown at Unknown time  . lisinopril (PRINIVIL,ZESTRIL) 20 MG tablet Take 20 mg by mouth every morning.   unknown at Unknown time  . QUEtiapine (SEROQUEL) 100 MG tablet Take 100 mg by mouth every morning.   unknown at Unknown time  . QUEtiapine (SEROQUEL) 300 MG tablet Take 300 mg by mouth at bedtime.   unknown at unknown    Musculoskeletal: Strength & Muscle Tone: within normal limits Gait & Station: normal Patient leans: N/A  Psychiatric Specialty Exam: Physical Exam  Nursing note and vitals reviewed. Respiratory: Effort normal.  Neurological: He is alert.    ROS  Blood pressure 100/70, pulse 71, temperature 98.6 F (  37 C), temperature source Oral, resp. rate 18, height 5' 10.5" (1.791 m), weight 74.4 kg (164 lb), SpO2 96 %.Body mass index is 23.2 kg/m.  General Appearance: Disheveled  Eye Contact:  Minimal  Speech:  Garbled, Pressured and Slurred  Volume:  normal  Mood:  Irritable  Affect:  Inappropriate and Labile  Thought Process:  Disorganized  Orientation:  Full (Time, Place, and Person)  Thought Content:  Illogical,  Delusions and Paranoid Ideation  Suicidal Thoughts:  No  Homicidal Thoughts: denies at this time  Memory:  Immediate;   Fair Recent;   Fair Remote;   Fair  Judgement:  Impaired  Insight:  Lacking  Psychomotor Activity:  Restlessness  Concentration:  Concentration: Poor  Recall:  Poor  Fund of Knowledge:  Fair  Language:  Fair  Akathisia:  No  Handed:  Right  AIMS (if indicated):     Assets:  Financial Resources/Insurance Housing Resilience Social Support  ADL's:  Impaired  Cognition:  Impaired,  Mild  Sleep:   8.3 hrs            Treatment Plan Summary: Daily contact with patient to assess and evaluate symptoms and progress in treatment and Medication management .  Plan 1. Continue psych meds- haldol, Seroquel, cogentin. Pt takes 200mg  haldol dec, only received 100mg  on 6/1 in ED. QTC on 6/1- 428, NSR. Will order 100mg  im today.  2. Group/[facility] tx. Will manage medical issues.  Observation Level/Precautions:  15 minute checks  Laboratory:  CBC Chemistry Profile UDS, TSH, lipid panel, EKG  Psychotherapy:    Medications:    Consultations:    Discharge Concerns:    Estimated LOS:  Other:     Physician Treatment Plan for Primary Diagnosis: <principal problem not specified> Long Term Goal(s): Improvement in symptoms so as ready for discharge  Short Term Goals: Ability to identify changes in lifestyle to reduce recurrence of condition will improve, Ability to verbalize feelings will improve, Ability to disclose and discuss suicidal ideas, Ability to demonstrate self-control will improve, Ability to identify and develop effective coping behaviors will improve, Ability to maintain clinical measurements within normal limits will improve, Compliance with prescribed medications will improve and Ability to identify triggers associated with substance abuse/mental health issues will improve  Physician Treatment Plan for Secondary Diagnosis: Active Problems:   Schizophrenia  (HCC)  Long Term Goal(s): Improvement in symptoms so as ready for discharge  Short Term Goals: Ability to identify changes in lifestyle to reduce recurrence of condition will improve, Ability to verbalize feelings will improve, Ability to disclose and discuss suicidal ideas, Ability to demonstrate self-control will improve, Ability to identify and develop effective coping behaviors will improve, Ability to maintain clinical measurements within normal limits will improve, Compliance with prescribed medications will improve and Ability to identify triggers associated with substance abuse/mental health issues will improve  I certify that inpatient services furnished can reasonably be expected to improve the patient's condition.    Beverly Sessions, MD 6/3/20184:24 PM

## 2017-02-22 NOTE — [facility] Suicide Risk Assessment (Signed)
[facility] Admission Suicide Risk Assessment   Nursing information obtained from:    Demographic factors:    Current Mental Status:    Loss Factors:    Historical Factors:    Risk Reduction Factors:     Total Time spent with patient: 20 minutes Principal Problem: <principal problem not specified> Diagnosis:   Patient Active Problem List   Diagnosis Date Noted  . Schizophrenia (HCC) [F20.9] 02/20/2017  . Noncompliance [Z91.19] 02/20/2017  . Periumbilical hernia [K42.9] 10/20/2016  . Incarcerated hernia of abdominal cavity [K45.0]   . Sepsis (HCC) [A41.9] 03/03/2015  . Pyelonephritis [N12] 03/01/2015  . Hypokalemia [E87.6] 03/01/2015  . Renal failure [N19] 03/01/2015  . HTN (hypertension) [I10] 03/01/2015   Subjective Data:   " I want to go my group home" Patient is a 56year old malewith h/o schizophrenia admitted involuntarily. Pt was brought to  emergency room on 6/1 and seen by Dr. Toni Amend and at that time was judged to not need hospitalization and was to be discharged to his group home. When his ride came to pick him up he became acutely agitated began to express threats became very hostile mood became labile. He was not responding to appropriate verbal redirection. Appeared to be escalating to dangerousness. Per report, pt becoming increasingly verbally aggressive towards group home residents and staff. Patient recently eloped from day program.  Pt disorganized, unable to explain why he was agitated. Denies HI at this time. Denies SI.   Continued Clinical Symptoms:  Alcohol Use Disorder Identification Test Final Score (AUDIT): 0 The "Alcohol Use Disorders Identification Test", Guidelines for Use in Primary Care, Second Edition.  World Science writer [facility]). Score between 0-7:  no or low risk or alcohol related problems. Score between 8-15:  moderate risk of alcohol related problems. Score between 16-19:  high risk of alcohol related problems. Score 20 or above:  warrants further  diagnostic evaluation for alcohol dependence and treatment.   CLINICAL FACTORS:   Severe Anxiety and/or Agitation Schizophrenia:   Paranoid or undifferentiated type Currently Psychotic   Musculoskeletal: Strength & Muscle Tone: within normal limits Gait & Station: normal Patient leans: N/A  Psychiatric Specialty Exam: Physical Exam  Nursing note and vitals reviewed.   ROS  Blood pressure 100/70, pulse 71, temperature 98.6 F (37 C), temperature source Oral, resp. rate 18, height 5' 10.5" (1.791 m), weight 74.4 kg (164 lb), SpO2 96 %.Body mass index is 23.2 kg/m.  General Appearance:Disheveled  Eye Contact: Minimal  Speech: Garbled, Pressured and Slurred  Volume: normal  Mood: Irritable  Affect: Inappropriate and Labile  Thought Process: Disorganized  Orientation: Full (Time, Place, and Person)  Thought Content: Illogical, Delusions and Paranoid Ideation  Suicidal Thoughts: No  Homicidal Thoughts: denies at this time  Memory: Immediate; Fair Recent; Fair Remote; Fair  Judgement: Impaired  Insight: Lacking  Psychomotor Activity: Restlessness  Concentration: Concentration: Poor  Recall: Poor  Fund of Knowledge: Fair  Language: Fair  Akathisia: No  Handed: Right  AIMS (if indicated):   Assets: Financial Resources/Insurance Housing Resilience Social Support  ADL's: Impaired  Cognition: Impaired, Mild  Sleep: 8.3 hrs        COGNITIVE FEATURES THAT CONTRIBUTE TO RISK:  Concrete thinking, psychosis    SUICIDE RISK:   low  PLAN OF CARE:  Daily contact with patient to assess and evaluate symptoms and progress in treatment and Medication management .  Plan 1. Continue psych meds- haldol, Seroquel, cogentin. Pt takes 200mg  haldol dec, only received 100mg  on 6/1  in ED. QTC on 6/1- 428, NSR. Will order 100mg  im today.  2. Group/[facility] tx. Will manage medical issues.  Observation Level/Precautions:  15 minute checks   Laboratory:  CBC Chemistry Profile UDS, TSH, lipid panel, EKG  Psychotherapy:    Medications:    Consultations:    Discharge Concerns:    Estimated LOS:  Other:     Physician Treatment Plan for Primary Diagnosis: <principal problem not specified> Long Term Goal(s): Improvement in symptoms so as ready for discharge  Short Term Goals: Ability to identify changes in lifestyle to reduce recurrence of condition will improve, Ability to verbalize feelings will improve, Ability to disclose and discuss suicidal ideas, Ability to demonstrate self-control will improve, Ability to identify and develop effective coping behaviors will improve, Ability to maintain clinical measurements within normal limits will improve, Compliance with prescribed medications will improve and Ability to identify triggers associated with substance abuse/mental health issues will improve  Physician Treatment Plan for Secondary Diagnosis: Active Problems:   Schizophrenia (HCC)  Long Term Goal(s): Improvement in symptoms so as ready for discharge  Short Term Goals: Ability to identify changes in lifestyle to reduce recurrence of condition will improve, Ability to verbalize feelings will improve, Ability to disclose and discuss suicidal ideas, Ability to demonstrate self-control will improve, Ability to identify and develop effective coping behaviors will improve, Ability to maintain clinical measurements within normal limits will improve, Compliance with prescribed medications will improve and Ability to identify triggers associated with substance abuse/mental health issues will improve   I certify that inpatient services furnished can reasonably be expected to improve the patient's condition.   Beverly SessionsJagannath Snow Peoples, MD 02/22/2017, 4:47 PM

## 2017-02-22 NOTE — [facility] Group Notes (Signed)
[facility] LCSW Group Therapy  02/22/2017 2:40 PM  Type of Therapy:  Group Therapy  Participation Level:  Patient did not attend group. CSW invited patient to group.   Summary of Progress/Problems: Developing Gratitude/Positive Thoughts- Group facilitator defined what gratitude is and how it relates to building healthy relationships. Patients were asked to write two journal entries on the following topics: "Five things I am grateful for" and "Having a positive attitude can change my life by". Patients shared their responses with the group and discussed how this can impact mental wellbeing. Patients developed understanding about the impact of thinking positively and how it can improve happiness and self-esteem.   Roberto Vasquez G. Garnette CzechSampson MSW, LCSWA 02/22/2017, 2:41 PM

## 2017-02-22 NOTE — Progress Notes (Signed)
Pt received second dose of haldol dec injection to right deltoid. Pt tolerated well and cooperative. Will continue to monitor.

## 2017-02-22 NOTE — [facility] Counselor (Signed)
Adult Comprehensive Assessment  Patient ID: Roberto Vasquez, male   DOB: [birth year], 56 y.o.   MRN: [id number]  Information Source: Information source: Patient  Current Stressors:  Educational / Learning stressors: n/a Employment / Job issues: Pt is disability since 521981.  Family Relationships: n/a Surveyor, quantityinancial / Lack of resources (include bankruptcy): n/a Housing / Lack of housing: Pt lives in a group home and will return at discharge Physical health (include injuries & life threatening diseases): n/a Social relationships: n/a Substance abuse: Patient denies Bereavement / Loss: n/a  Living/Environment/Situation:  Living Arrangements: Group Home Living conditions (as described by patient or guardian): Pt states it is fine.  How long has patient lived in current situation?: About 3 years What is atmosphere in current home: Comfortable, Supportive  Family History:  Marital status: Single Are you sexually active?: No What is your sexual orientation?: heterosexual Has your sexual activity been affected by drugs, alcohol, medication, or emotional stress?: n/a Does patient have children?: Yes How many children?: 2 How is patient's relationship with their children?: 2 daughters. Patient states he has a good relationship with his daughters.   Childhood History:  By whom was/is the patient raised?: Grandparents Additional childhood history information: Patient was raised by his grandmother Description of patient's relationship with caregiver when they were a child: No involvement with parents growing up. Patient's description of current relationship with people who raised him/her: Grandmother and biological father deceased. Mother is still living in lives in [place].  How were you disciplined when you got in trouble as a child/adolescent?: n/a Does patient have siblings?: Yes Number of Siblings: 3 Description of patient's current relationship with siblings: 2 sisters and 1  brother. Brother is in prison.  Did patient suffer any verbal/emotional/physical/sexual abuse as a child?: No Did patient suffer from severe childhood neglect?: No Has patient ever been sexually abused/assaulted/raped as an adolescent or adult?: No Was the patient ever a victim of a crime or a disaster?: No Witnessed domestic violence?: No Has patient been effected by domestic violence as an adult?: No  Education:  Highest grade of school patient has completed: 10th Currently a student?: No Name of school: n/a Learning disability?: No  Employment/Work Situation:   Employment situation: On disability Why is patient on disability: mental health How long has patient been on disability: Since [address] Patient's job has been impacted by current illness: No What is the longest time patient has a held a job?: 5-6 years Where was the patient employed at that time?: [employer].  Has patient ever been in the [employer] and Companymilitary?: No Has patient ever served in combat?: No Did You Receive Any Psychiatric Treatment/Services While in the [employer]?: No Are There Guns or Other Weapons in Your Home?: No Are These Weapons Safely Secured?:  (n/a)  Financial Resources:   Financial resources: Insurance claims handlereceives SSDI, Medicare, Medicaid Does patient have a representative payee or guardian?: No  Alcohol/Substance Abuse:   What has been your use of drugs/alcohol within the last 12 months?: Patient denies If attempted suicide, did drugs/alcohol play a role in this?: No Alcohol/Substance Abuse Treatment Hx: Denies past history Has alcohol/substance abuse ever caused legal problems?: No  Social Support System:   Patient's Community Support System: Fair Development worker, communityDescribe Community Support System: Patient states he has support from group Theatre stage managerhome manager and staff.  Type of faith/religion: n/a How does patient's faith help to cope with current illness?: n/a  Leisure/Recreation:   Leisure and Hobbies: Basketball  Strengths/Needs:    What things does the  patient do well?: communication, resilient, "I'm a people person" In what areas does patient struggle / problems for patient: Patient recently eloped from group home and also has been verbally agressive towards group home residents and staff recently.   Discharge Plan:   Does patient have access to transportation?: Yes Will patient be returning to same living situation after discharge?: Yes Currently receiving community mental health services: Yes (From Whom) Does patient have financial barriers related to discharge medications?: No  Summary/Recommendations:   Patient is a 56 year old male admitted involuntarily with a diagnosis of Schizophrenia. Information was obtained from psychosocial assessment completed with patient and chart review conducted by this evaluator. Patient presented to the hospital IVC by his group home due to patient becoming increasingly verbally aggressive towards group home residents and staff. Patient recently eloped from day program approximately two Fridays ago and was brought back to group home on Monday 02/16/17. Patient will return to group home at discharge. Patient will benefit from crisis stabilization, medication evaluation, group therapy and psycho education in addition to case management for discharge. At discharge, it is recommended that patient remain compliant with established discharge plan and continued treatment.   Mckinnon Glick G. Garnette Czech MSW, LCSWA 02/22/2017 3:11 PM

## 2017-02-22 NOTE — Progress Notes (Signed)
Pt denies SI, HI, a/v hallucinations. Roberto Vasquez is polite and compliant with all scheduled medications. Pt commits to safety on unit. No aggressive behaviors noted. RN was told pt does not attend therapy groups. Will continue to monitor for safety.

## 2017-02-23 ENCOUNTER — Encounter: Payer: Self-pay | Admitting: Psychiatry

## 2017-02-23 DIAGNOSIS — F209 Schizophrenia, unspecified: Principal | ICD-10-CM

## 2017-02-23 DIAGNOSIS — F17200 Nicotine dependence, unspecified, uncomplicated: Secondary | ICD-10-CM

## 2017-02-23 MED ORDER — LISINOPRIL 5 MG PO TABS: 10.0000 mg | ORAL_TABLET | Freq: Every day | ORAL | Status: DC

## 2017-02-23 MED ORDER — AMANTADINE HCL 100 MG PO CAPS
100.0000 mg | ORAL_CAPSULE | Freq: Two times a day (BID) | ORAL | Status: DC
  Administered 2017-02-23 – 2017-02-26 (×6): 100 mg via ORAL
  Filled 2017-02-23 (×6): qty 1

## 2017-02-23 MED ORDER — NICOTINE 21 MG/24HR TD PT24
21.0000 mg | MEDICATED_PATCH | Freq: Every day | TRANSDERMAL | Status: DC
  Administered 2017-02-23 – 2017-02-24 (×2): 21 mg via TRANSDERMAL
  Filled 2017-02-23 (×2): qty 1

## 2017-02-23 NOTE — Plan of Care (Signed)
Problem: Nutrition: Goal: Adequate nutrition will be maintained Patient encouraged to attend meals. Patient also encouraged to increase po fluid intake in order to stay hydrated and assist with bowel elimination. Patient verbalized understanding of the need to go to meals and drinks fluids in between meals as tolerated.

## 2017-02-23 NOTE — [facility] Suicide Risk Assessment (Signed)
[facility] INPATIENT:  Family/Significant Other Suicide Prevention Education  Suicide Prevention Education:  Education Completed: Group home director, Harland GermanFrancis Walington ph#: [phone] has been identified by the patient as the family member/significant other with whom the patient will be residing, and identified as the person(s) who will aid the patient in the event of a mental health crisis (suicidal ideations/suicide attempt).  With written consent from the patient, the family member/significant other has been provided the following suicide prevention education, prior to the and/or following the discharge of the patient.  The suicide prevention education provided includes the following:  Suicide risk factors  Suicide prevention and interventions  National Suicide Hotline telephone number  [facility] assessment telephone number  [facility] Emergency Assistance 911  [facility] and/or Residential Mobile Crisis Unit telephone number  Request made of family/significant other to:  Remove weapons (e.g., guns, rifles, knives), all items previously/currently identified as safety concern.    Remove drugs/medications (over-the-counter, prescriptions, illicit drugs), all items previously/currently identified as a safety concern.  The family member/significant other verbalizes understanding of the suicide prevention education information provided.  The family member/significant other agrees to remove the items of safety concern listed above.  Roberto OxfordKadijah R Inas Vasquez, MSW, LCSW-A 02/23/2017, 3:04 PM

## 2017-02-23 NOTE — Progress Notes (Signed)
Patient currently denies SI/HI, A/V hallucinations. Patient is polite, pleasant and cooperative at this time. Compliant with all of his medications. Tobacco screening performed on patient this shift. Patient verbalized smoking one pack of cigarette since age 56. An order put in to pharmacy for client for a nicotine patch. Patient encouraged to attend group therapy today. Per recreational therapist, patient did not attend the morning group session. Patient observed resting in bed with eyes closed the throughout the shift, minimal peer interaction noted. Q 15 minutes checks were performed to maintain safety on the unit. Patient's BP was low this a.m., MD notified and this writer received an order to hold the a.m. dose of BP medications. Medication would be reviewed by MD until arrival to the unit. Will continue to monitor patient and any acute changes will be reported to MD.

## 2017-02-23 NOTE — [facility] Group Notes (Signed)
[facility] LCSW Group Therapy   02/23/2017 1:00 pm Type of Therapy: Group Therapy   Participation Level: Patient invited but did not attend.    [name], MSW, LCSW-A 02/23/2017, 2:40PM

## 2017-02-23 NOTE — Progress Notes (Signed)
Recreation Therapy Notes  Date: 06.04.18 Time: 9:30 am Location: Craft Room  Group Topic: Self-expression  Goal Area(s) Addresses:  Patient will be able to identify a color that represents each emotion. Patient will verbalize benefit of using art as a means of self-expression. Patient will verbalize one emotion experienced while participating in activity.  Behavioral Response: Did not attend  Intervention: The Colors Within Me  Activity: Patients were given a blank face worksheet and were instructed to pick a color for each emotion they were feeling and show on the worksheet how much of that emotion they were feeling.  Education: LRT educated patients on other forms of self-expression.  Education Outcome: Patient did not attend group.   Clinical Observations/Feedback: Patient did not attend group.  Jacquelynn CreeGreene,Drago Hammonds M, LRT/CTRS 02/23/2017 10:14 AM

## 2017-02-23 NOTE — [facility] Group Notes (Signed)
[facility] Group Notes:  (Nursing/MHT/Case Management/Adjunct)  Date:  02/23/2017  Time:  11:22 PM  Type of Therapy:  Psychoeducational Skills  Participation Level:  Did Not Attend  Foy GuadalajaraJasmine R Janee Ureste 02/23/2017, 11:22 PM

## 2017-02-23 NOTE — Progress Notes (Signed)
Recreation Therapy Notes  At approximately 2:06 pm, LRT attempted assessment. Patient sleeping and did not wake when name was called.  Roberto Vasquez,Roberto Vasquez, LRT/CTRS 02/23/2017 3:16 PM

## 2017-02-23 NOTE — Tx Team (Signed)
Interdisciplinary Treatment and Diagnostic Plan Update  02/23/2017 Time of Session: 11:00 AM Roberto Vasquez MRN: [id number]  Principal Diagnosis: Schizophrenia [facility])  Secondary Diagnoses: Principal Problem:   Schizophrenia (HCC) Active Problems:   HTN (hypertension)   Noncompliance   Tobacco use disorder   Current Medications:  Current Facility-Administered Medications  Medication Dose Route Frequency Provider Last Rate Last Dose  . acetaminophen (TYLENOL) tablet 650 mg  650 mg Oral Q6H PRN Clapacs, John T, MD      . alum & mag hydroxide-simeth (MAALOX/MYLANTA) 200-200-20 MG/5ML suspension 30 mL  30 mL Oral Q4H PRN Clapacs, John T, MD      . amantadine (SYMMETREL) capsule 100 mg  100 mg Oral BID Jimmy Footman, MD      . amLODipine (NORVASC) tablet 10 mg  10 mg Oral Daily Clapacs, Jackquline Denmark, MD   Stopped at 02/23/17 0818  . haloperidol (HALDOL) tablet 10 mg  10 mg Oral BID Beverly Sessions, MD   10 mg at 02/23/17 0813  . haloperidol (HALDOL) tablet 5 mg  5 mg Oral Q6H PRN Clapacs, John T, MD      . magnesium hydroxide (MILK OF MAGNESIA) suspension 30 mL  30 mL Oral Daily PRN Clapacs, John T, MD      . nicotine (NICODERM CQ - dosed in mg/24 hours) patch 21 mg  21 mg Transdermal Daily Jimmy Footman, MD   21 mg at 02/23/17 1143  . QUEtiapine (SEROQUEL) tablet 300 mg  300 mg Oral QHS Clapacs, John T, MD   300 mg at 02/22/17 2137  . traZODone (DESYREL) tablet 100 mg  100 mg Oral QHS PRN Clapacs, Jackquline Denmark, MD   100 mg at 02/22/17 2137   PTA Medications: Prescriptions Prior to Admission  Medication Sig Dispense Refill Last Dose  . amLODipine (NORVASC) 10 MG tablet Take 10 mg by mouth every morning.   unknown at unknown  . benztropine (COGENTIN) 1 MG tablet Take 1 mg by mouth 2 (two) times daily.   unknown at Unknown time  . haloperidol (HALDOL) 10 MG tablet Take 10 mg by mouth 2 (two) times daily.   unknown at Unknown time  . haloperidol (HALDOL) 5 MG tablet  Take 5 mg by mouth daily as needed for agitation.   prn at prn  . haloperidol decanoate (HALDOL DECANOATE) 100 MG/ML injection Inject 100 mg into the muscle every 28 (twenty-eight) days.   unknown at unknown  . hydrochlorothiazide (MICROZIDE) 12.5 MG capsule Take 12.5 mg by mouth every morning.   unknown at Unknown time  . lisinopril (PRINIVIL,ZESTRIL) 20 MG tablet Take 20 mg by mouth every morning.   unknown at Unknown time  . QUEtiapine (SEROQUEL) 100 MG tablet Take 100 mg by mouth every morning.   unknown at Unknown time  . QUEtiapine (SEROQUEL) 300 MG tablet Take 300 mg by mouth at bedtime.   unknown at unknown    Patient Stressors: Medication change or noncompliance  Patient Strengths: Ability for insight Communication skills  Treatment Modalities: Medication Management, Group therapy, Case management,  1 to 1 session with clinician, Psychoeducation, Recreational therapy.   Physician Treatment Plan for Primary Diagnosis: Schizophrenia (HCC) Long Term Goal(s): Improvement in symptoms so as ready for discharge Improvement in symptoms so as ready for discharge   Short Term Goals: Ability to identify changes in lifestyle to reduce recurrence of condition will improve Ability to verbalize feelings will improve Ability to disclose and discuss suicidal ideas Ability to demonstrate self-control will improve  Ability to identify and develop effective coping behaviors will improve Ability to maintain clinical measurements within normal limits will improve Compliance with prescribed medications will improve Ability to identify triggers associated with substance abuse/mental health issues will improve Ability to identify changes in lifestyle to reduce recurrence of condition will improve Ability to verbalize feelings will improve Ability to disclose and discuss suicidal ideas Ability to demonstrate self-control will improve Ability to identify and develop effective coping behaviors will  improve Ability to maintain clinical measurements within normal limits will improve Compliance with prescribed medications will improve Ability to identify triggers associated with substance abuse/mental health issues will improve  Medication Management: Evaluate patient's response, side effects, and tolerance of medication regimen.  Therapeutic Interventions: 1 to 1 sessions, Unit Group sessions and Medication administration.  Evaluation of Outcomes: Progressing  Physician Treatment Plan for Secondary Diagnosis: Principal Problem:   Schizophrenia (HCC) Active Problems:   HTN (hypertension)   Noncompliance   Tobacco use disorder  Long Term Goal(s): Improvement in symptoms so as ready for discharge Improvement in symptoms so as ready for discharge   Short Term Goals: Ability to identify changes in lifestyle to reduce recurrence of condition will improve Ability to verbalize feelings will improve Ability to disclose and discuss suicidal ideas Ability to demonstrate self-control will improve Ability to identify and develop effective coping behaviors will improve Ability to maintain clinical measurements within normal limits will improve Compliance with prescribed medications will improve Ability to identify triggers associated with substance abuse/mental health issues will improve Ability to identify changes in lifestyle to reduce recurrence of condition will improve Ability to verbalize feelings will improve Ability to disclose and discuss suicidal ideas Ability to demonstrate self-control will improve Ability to identify and develop effective coping behaviors will improve Ability to maintain clinical measurements within normal limits will improve Compliance with prescribed medications will improve Ability to identify triggers associated with substance abuse/mental health issues will improve     Medication Management: Evaluate patient's response, side effects, and tolerance of  medication regimen.  Therapeutic Interventions: 1 to 1 sessions, Unit Group sessions and Medication administration.  Evaluation of Outcomes: Progressing   RN Treatment Plan for Primary Diagnosis: Schizophrenia (HCC) Long Term Goal(s): Knowledge of disease and therapeutic regimen to maintain health will improve  Short Term Goals: Ability to remain free from injury will improve, Ability to demonstrate self-control and Compliance with prescribed medications will improve  Medication Management: RN will administer medications as ordered by provider, will assess and evaluate patient's response and provide education to patient for prescribed medication. RN will report any adverse and/or side effects to prescribing provider.  Therapeutic Interventions: 1 on 1 counseling sessions, Psychoeducation, Medication administration, Evaluate responses to treatment, Monitor vital signs and CBGs as ordered, Perform/monitor CIWA, COWS, AIMS and Fall Risk screenings as ordered, Perform wound care treatments as ordered.  Evaluation of Outcomes: Progressing   LCSW Treatment Plan for Primary Diagnosis: Schizophrenia (HCC) Long Term Goal(s): Safe transition to appropriate next level of care at discharge, Engage patient in therapeutic group addressing interpersonal concerns.  Short Term Goals: Engage patient in aftercare planning with referrals and resources and Increase ability to appropriately verbalize feelings  Therapeutic Interventions: Assess for all discharge needs, 1 to 1 time with Social worker, Explore available resources and support systems, Assess for adequacy in community support network, Educate family and significant other(s) on suicide prevention, Complete Psychosocial Assessment, Interpersonal group therapy.  Evaluation of Outcomes: Progressing   Progress in Treatment: Attending groups: No. Participating  in groups: No. Taking medication as prescribed: Yes. Toleration medication:  Yes. Family/Significant other contact made: Yes, individual(s) contacted:  CSW spoke with patient's group home. Patient understands diagnosis: Yes. Discussing patient identified problems/goals with staff: Yes. Medical problems stabilized or resolved: Yes. Denies suicidal/homicidal ideation: Yes. Issues/concerns per patient self-inventory: No.  New problem(s) identified: No, Describe:  None.  New Short Term/Long Term Goal(s): Patient stated that his goal is "getting out of the hospital."  Discharge Plan or Barriers: [facility] and follow-up with outpatient provider.  Reason for Continuation of Hospitalization: Aggression Medication stabilization  Estimated Length of Stay: 2-3 days   Attendees: Patient: Roberto Vasquez  02/23/2017 2:09 PM  Physician: Dr. Radene JourneyAndrea Hernandez, MD  02/23/2017 2:09 PM  Nursing:  02/23/2017 2:09 PM  RN Care Manager: 02/23/2017 2:09 PM  Social Worker: Hampton AbbotKadijah Mahki Spikes, MSW, LCSW-A 02/23/2017 2:09 PM  Recreational Therapist: Princella IonElizabeth Greene, LRT, CTRS  02/23/2017 2:09 PM  Other:  02/23/2017 2:09 PM  Other:  02/23/2017 2:09 PM  Other: 02/23/2017 2:09 PM    Scribe for Treatment Team: Lynden OxfordKadijah R Toluwanimi Radebaugh, LCSWA 02/23/2017 2:09 PM

## 2017-02-23 NOTE — [facility] Group Notes (Signed)
[facility] Group Notes:  (Nursing/MHT/Case Management/Adjunct)  Date:  02/23/2017  Time:  4:19 AM  Type of Therapy:  Group Therapy  Participation Level:  Did Not Attend    Roberto Vasquez 02/23/2017, 4:19 AM

## 2017-02-23 NOTE — Progress Notes (Signed)
[facility] MD Progress Note  02/23/2017 1:19 PM Roberto Vasquez  MRN:  [id number] Subjective:  " I want to go my group home" Patient is a 56year old malewith h/o schizophrenia admitted involuntarily. Pt was brought to  emergency room on 6/1 and seen by Dr. Toni Amend and at that time was judged to not need hospitalization and was to be discharged to his group home. When his ride came to pick him up he became acutely agitated began to express threats became very hostile mood became labile. He was not responding to appropriate verbal redirection. Appeared to be escalating to dangerousness. Per report, pt becoming increasingly verbally aggressive towards group home residents and staff. Patient recently eloped from day program.  Pt disorganized, unable to explain why he was agitated. Denies HI at this time. Denies SI.   6/4 patient was calm and cooperative today. He tells me he does not have any problems with mood, appetite, energy, sleep or concentration. He denies any suicidality, homicidality or having auditory or visual hallucinations. He denies having physical complaints or any side effects from his medications. Says that he feels ready to go home.  Patient has been living in a group home for years however he eloped a couple weeks ago and was living on the streets. During that time he was noncompliant with any of his medications. He was found and brought back to the group home just a couple days ago. Over the weekend he got into a verbal altercation with another resident. The patient was brought into the ER for evaluation. The ER psychiatrist saw him and felt patient was safe to be discharged back to the group home. Once staff from the group home came to pick him up he became belligerent, aggressive and agitated and started yelling that he was, kill them.  Per nursing: Patient currently denies SI/HI, A/V hallucinations. Patient is polite, pleasant and cooperative at this time. Compliant with all of his  medications. Tobacco screening performed on patient this shift. Patient verbalized smoking one pack of cigarette since age 56. An order put in to pharmacy for client for a nicotine patch. Patient encouraged to attend group therapy today. Per recreational therapist, patient did not attend the morning group session. Patient observed resting in bed with eyes closed the throughout the shift, minimal peer interaction noted. Q 15 minutes checks were performed to maintain safety on the unit. Patient's BP was low this a.m., MD notified and this writer received an order to hold the a.m. dose of BP medications. Medication would be reviewed by MD until arrival to the unit. Will continue to monitor patient and any acute changes will be reported to MD.    Principal Problem: Schizophrenia (HCC) Diagnosis:   Patient Active Problem List   Diagnosis Date Noted  . Tobacco use disorder [F17.200] 02/23/2017  . Schizophrenia (HCC) [F20.9] 02/20/2017  . Noncompliance [Z91.19] 02/20/2017  . HTN (hypertension) [I10] 03/01/2015   Total Time spent with patient: 30 minutes  Past Psychiatric History: long history of schizophrenia. Several prior hospitalizations. Denies past history of suicidal or homicidal behavior. Evidently has been pretty stable for years. He's had several encounters at the hospital over the last several years for medical issues and psychiatric problems never seem to of been seen as a impediment to treatment. He is on Haldol Decanoate, oral Haldol and oral Seroquel. History of hostility and anger especially when psychotic. Subsequent to his being discharged we were informed by his outpatient treatment team that he probably did not  receive his haloperidol decanoate injection last month either making him more behind on his medicine and we had thought   Past Medical History:  Past Medical History:  Diagnosis Date  . Anxiety   . Hepatitis C   . Hypertension   . Schizophrenia (HCC)   . Seizures (HCC)      Past Surgical History:  Procedure Laterality Date  . ABDOMINAL SURGERY    . VENTRAL HERNIA REPAIR N/A 10/20/2016   Procedure: HERNIA REPAIR VENTRAL ADULT;  Surgeon: Lattie Haw, MD;  Location: [facility] ORS;  Service: General;  Laterality: N/A;   Family History:  Family History  Problem Relation Age of Onset  . Hypertension Mother    Family Psychiatric  History: unknown  Social History:  History  Alcohol Use No     History  Drug Use No    Social History   Social History  . Marital status: Single    Spouse name: N/A  . Number of children: N/A  . Years of education: N/A   Social History Main Topics  . Smoking status: Current Every Day Smoker    Packs/day: 1.00    Years: 40.00    Types: Cigarettes  . Smokeless tobacco: Never Used  . Alcohol use No  . Drug use: No  . Sexual activity: Not Asked   Other Topics Concern  . None   Social History Narrative  . None     Current Medications: Current Facility-Administered Medications  Medication Dose Route Frequency Provider Last Rate Last Dose  . acetaminophen (TYLENOL) tablet 650 mg  650 mg Oral Q6H PRN Clapacs, John T, MD      . alum & mag hydroxide-simeth (MAALOX/MYLANTA) 200-200-20 MG/5ML suspension 30 mL  30 mL Oral Q4H PRN Clapacs, John T, MD      . amLODipine (NORVASC) tablet 10 mg  10 mg Oral Daily Clapacs, Jackquline Denmark, MD   Stopped at 02/23/17 0818  . benztropine (COGENTIN) tablet 1 mg  1 mg Oral BID PRN Clapacs, John T, MD      . haloperidol (HALDOL) tablet 10 mg  10 mg Oral BID Beverly Sessions, MD   10 mg at 02/23/17 0813  . haloperidol (HALDOL) tablet 5 mg  5 mg Oral Q6H PRN Clapacs, John T, MD      . hydrochlorothiazide (MICROZIDE) capsule 12.5 mg  12.5 mg Oral Daily Clapacs, Jackquline Denmark, MD   Stopped at 02/23/17 [phone]  . lisinopril (PRINIVIL,ZESTRIL) tablet 20 mg  20 mg Oral Daily Clapacs, Jackquline Denmark, MD   Stopped at 02/23/17 [phone]  . magnesium hydroxide (MILK OF MAGNESIA) suspension 30 mL  30 mL Oral Daily PRN  Clapacs, John T, MD      . nicotine (NICODERM CQ - dosed in mg/24 hours) patch 21 mg  21 mg Transdermal Daily Roberto Footman, MD   21 mg at 02/23/17 1143  . QUEtiapine (SEROQUEL) tablet 300 mg  300 mg Oral QHS Clapacs, John T, MD   300 mg at 02/22/17 2137  . traZODone (DESYREL) tablet 100 mg  100 mg Oral QHS PRN Clapacs, Jackquline Denmark, MD   100 mg at 02/22/17 2137    Lab Results:  Results for orders placed or performed during the hospital encounter of 02/21/17 (from the past 48 hour(s))  Lipid panel     Status: Abnormal   Collection Time: 02/22/17  6:50 AM  Result Value Ref Range   Cholesterol 138 0 - 200 mg/dL   Triglycerides 79 <540 mg/dL  HDL 36 (L) >40 mg/dL   Total CHOL/HDL Ratio 3.8 RATIO   VLDL 16 0 - 40 mg/dL   LDL Cholesterol 86 0 - 99 mg/dL    Comment:        Total Cholesterol/HDL:CHD Risk Coronary Heart Disease Risk Table                     Men   Women  1/2 Average Risk   3.4   3.3  Average Risk       5.0   4.4  2 X Average Risk   9.6   7.1  3 X Average Risk  23.4   11.0        Use the calculated Patient Ratio above and the CHD Risk Table to determine the patient's CHD Risk.        ATP III CLASSIFICATION (LDL):  <100     mg/dL   Optimal  962-952  mg/dL   Near or Above                    Optimal  130-159  mg/dL   Borderline  841-324  mg/dL   High  >401     mg/dL   Very High   TSH     Status: None   Collection Time: 02/22/17  6:50 AM  Result Value Ref Range   TSH 0.474 0.350 - 4.500 uIU/mL    Comment: Performed by a 3rd Generation assay with a functional sensitivity of <=0.01 uIU/mL.    Blood Alcohol level:  Lab Results  Component Value Date   ETH <5 02/20/2017   ETH <11 07/17/2011    Metabolic Disorder Labs: No results found for: HGBA1C, MPG No results found for: PROLACTIN Lab Results  Component Value Date   CHOL 138 02/22/2017   TRIG 79 02/22/2017   HDL 36 (L) 02/22/2017   CHOLHDL 3.8 02/22/2017   VLDL 16 02/22/2017   LDLCALC 86  02/22/2017    Physical Findings: AIMS: Facial and Oral Movements Muscles of Facial Expression: None, normal Lips and Perioral Area: None, normal Jaw: None, normal Tongue: None, normal,Extremity Movements Upper (arms, wrists, hands, fingers): None, normal Lower (legs, knees, ankles, toes): None, normal, Trunk Movements Neck, shoulders, hips: None, normal, Overall Severity Severity of abnormal movements (highest score from questions above): None, normal Incapacitation due to abnormal movements: None, normal Patient's awareness of abnormal movements (rate only patient's report): No Awareness, Dental Status Current problems with teeth and/or dentures?: No Does patient usually wear dentures?: No  CIWA:  CIWA-Ar Total: 0 COWS:     Musculoskeletal: Strength & Muscle Tone: within normal limits Gait & Station: normal Patient leans: N/A  Psychiatric Specialty Exam: Physical Exam  Constitutional: He is oriented to person, place, and time. He appears well-developed and well-nourished.  HENT:  Head: Normocephalic and atraumatic.  Eyes: Conjunctivae and EOM are normal.  Neck: Normal range of motion.  Respiratory: Effort normal.  Musculoskeletal: Normal range of motion.  Neurological: He is alert and oriented to person, place, and time.    Review of Systems  Constitutional: Negative.   HENT: Negative.   Eyes: Negative.   Respiratory: Negative.   Cardiovascular: Negative.   Gastrointestinal: Negative.   Genitourinary: Negative.   Musculoskeletal: Negative.   Skin: Negative.   Neurological: Negative.   Endo/Heme/Allergies: Negative.   Psychiatric/Behavioral: Negative.     Blood pressure 95/61, pulse (!) 101, temperature 98.4 F (36.9 C), temperature source Oral, resp. rate 18, height  5' 10.5" (1.791 m), weight 74.4 kg (164 lb), SpO2 96 %.Body mass index is 23.2 kg/m.  General Appearance: Fairly Groomed  Eye Contact:  Good  Speech:  Clear and Coherent  Volume:  Normal  Mood:   Euthymic  Affect:  Appropriate and Congruent  Thought Process:  Linear and Descriptions of Associations: Intact  Orientation:  Full (Time, Place, and Person)  Thought Content:  Hallucinations: None  Suicidal Thoughts:  No  Homicidal Thoughts:  No  Memory:  Immediate;   Fair Recent;   Fair Remote;   Fair  Judgement:  Poor  Insight:  Shallow  Psychomotor Activity:  Normal  Concentration:  Concentration: Fair and Attention Span: Fair  Recall:  FiservFair  Fund of Knowledge:  Fair  Language:  Good  Akathisia:  No  Handed:    AIMS (if indicated):     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 He received Haldol decanoate on June 3, 100 mg.  Prior to admission he was also prescribed with Seroquel 300 mg daily at bedtime--- will continue Seroquel  EPS: He is currently on benztropine which he says he dislikes. I will change it to amantadine 100 mg twice a day  Hypertension: He is currently on 3 blood pressure medications. His blood pressure was low this a.m. I will discontinue lisinopril and hydrochlorothiazide. He will be continued only on Norvasc 10 mg daily  Tobacco use disorder continue nicotine patch 21 mg a day  Diet will change to low sodium  Precautions every 15 minute checks  Hospitalization and status involuntary  Follow-up will continue to follow-up with his outpatient psychiatrist  Disposition back to the group home once stable.  Labs Will check hemoglobin A1c, lipid panel and TSH.   Roberto FootmanHernandez-Gonzalez,  Roberto Westbrook, MD 02/23/2017, 1:19 PM

## 2017-02-24 LAB — PROLACTIN: Prolactin: 43.7 ng/mL — ABNORMAL HIGH (ref 4.0–15.2)

## 2017-02-24 LAB — HEMOGLOBIN A1C
Hgb A1c MFr Bld: 4.4 % — ABNORMAL LOW (ref 4.8–5.6)
Mean Plasma Glucose: 80 mg/dL

## 2017-02-24 NOTE — Plan of Care (Signed)
Problem: Safety: Goal: Ability to remain free from injury will improve Outcome: Progressing Patient noted to have on his non-slip socks when ambulating in the room and hallway. Gait is steady, BP monitored daily to assess for any abnormalities.

## 2017-02-24 NOTE — Progress Notes (Signed)
Patient remains pleasant and [facility]. Patient encouraged to attend therapy group class this morning but when this writer went to check on him he was still in the bed resting with his eyes closed. Patient verbalized that his goal for today was to get out of bed and go to class and go outside today. Patient's BP was low this morning (it was reported to be low on 02/23/17 as well), MD notified and an order to d/c Norvasc was given. Patient  Remains safe on the unit with Q 15 minutes checks.

## 2017-02-24 NOTE — Progress Notes (Signed)
Recreation Therapy Notes  Date: 06.05.18 Time: 9:30 am Location: Craft Room  Group Topic: Coping Skills  Goal Area(s) Addresses:  Patient will write healthy coping skills. Patient will verbalize benefit of using healthy coping skills.  Behavioral Response: Did not attend  Intervention: Coping Skills Alphabet  Activity: Patients were given a Coping Skills Alphabet worksheet and were instructed to write healthy coping skills for each letter of the alphabet.  Education: LRT educated patients on healthy coping skills.  Education Outcome: Patient did not attend group.  Clinical Observations/Feedback: Patient did not attend group.  Jenisis Harmsen M, LRT/CTRS 02/24/2017 10:14 AM 

## 2017-02-24 NOTE — Progress Notes (Signed)
[facility] MD Progress Note  02/24/2017 12:19 PM Roberto Vasquez  MRN:  [id number] Subjective:  " I want to go my group home" Patient is a 56year old malewith h/o schizophrenia admitted involuntarily. Pt was brought to  emergency room on 6/1 and seen by Dr. Toni Vasquez and at that time was judged to not need hospitalization and was to be discharged to his group home. When his ride came to pick him up he became acutely agitated began to express threats became very hostile mood became labile. He was not responding to appropriate verbal redirection. Appeared to be escalating to dangerousness. Per report, pt becoming increasingly verbally aggressive towards group home residents and staff. Patient recently eloped from day program.  Pt disorganized, unable to explain why he was agitated. Denies HI at this time. Denies SI.   6/4 patient was calm and cooperative today. He tells me he does not have any problems with mood, appetite, energy, sleep or concentration. He denies any suicidality, homicidality or having auditory or visual hallucinations. He denies having physical complaints or any side effects from his medications. Says that he feels ready to go home.  Patient has been living in a group home for years however he eloped a couple weeks ago and was living on the streets. During that time he was noncompliant with any of his medications. He was found and brought back to the group home just a couple days ago. Over the weekend he got into a verbal altercation with another resident. The patient was brought into the ER for evaluation. The ER psychiatrist saw him and felt patient was safe to be discharged back to the group home. Once staff from the group home came to pick him up he became belligerent, aggressive and agitated and started yelling that he was, kill them.  6/5 Patient was lying in bed. [name] he is doing fine today. He said he slept well and is eating well. Denies any medication side effects. Patient denies  hallucinations, homocidality, suicidality.  He is looking forward to being discharged to the group[ home.  Per nursing: Patient remains pleasant and cooperative with his treatment on the [facility] unit. Patient encouraged to attend therapy group class this morning but when this writer went to check on him he was still in the bed resting with his eyes closed. Patient verbalized that his goal for today was to get out of bed and go to class and go outside today. Patient's BP was low this morning (it was reported to be low on 02/23/17 as well), MD notified and an order to d/c Norvasc was given. Patient  Remains safe on the unit with Q 15 minutes checks.  Principal Problem: Schizophrenia (HCC) Diagnosis:   Patient Active Problem List   Diagnosis Date Noted  . Tobacco use disorder [F17.200] 02/23/2017  . Schizophrenia (HCC) [F20.9] 02/20/2017  . Noncompliance [Z91.19] 02/20/2017  . HTN (hypertension) [I10] 03/01/2015   Total Time spent with patient: 30 minutes  Past Psychiatric History: long history of schizophrenia. Several prior hospitalizations. Denies past history of suicidal or homicidal behavior. Evidently has been pretty stable for years. He's had several encounters at the hospital over the last several years for medical issues and psychiatric problems never seem to of been seen as a impediment to treatment. He is on Haldol Decanoate, oral Haldol and oral Seroquel. History of hostility and anger especially when psychotic. Subsequent to his being discharged we were informed by his outpatient treatment team that he probably did not receive  his haloperidol decanoate injection last month either making him more behind on his medicine and we had thought   Past Medical History:  Past Medical History:  Diagnosis Date  . Anxiety   . Hepatitis C   . Hypertension   . Schizophrenia (HCC)   . Seizures (HCC)     Past Surgical History:  Procedure Laterality Date  . ABDOMINAL SURGERY    . VENTRAL HERNIA  REPAIR N/A 10/20/2016   Procedure: HERNIA REPAIR VENTRAL ADULT;  Surgeon: Lattie Hawichard E Cooper, MD;  Location: [facility] ORS;  Service: General;  Laterality: N/A;   Family History:  Family History  Problem Relation Age of Onset  . Hypertension Mother    Family Psychiatric  History: unknown  Social History:  History  Alcohol Use No     History  Drug Use No    Social History   Social History  . Marital status: Single    Spouse name: N/A  . Number of children: N/A  . Years of education: N/A   Social History Main Topics  . Smoking status: Current Every Day Smoker    Packs/day: 1.00    Years: 40.00    Types: Cigarettes  . Smokeless tobacco: Never Used  . Alcohol use No  . Drug use: No  . Sexual activity: Not Asked   Other Topics Concern  . None   Social History Narrative  . None     Current Medications: Current Facility-Administered Medications  Medication Dose Route Frequency Provider Last Rate Last Dose  . acetaminophen (TYLENOL) tablet 650 mg  650 mg Oral Q6H PRN Clapacs, John T, MD      . alum & mag hydroxide-simeth (MAALOX/MYLANTA) 200-200-20 MG/5ML suspension 30 mL  30 mL Oral Q4H PRN Clapacs, John T, MD      . amantadine (SYMMETREL) capsule 100 mg  100 mg Oral BID Jimmy FootmanHernandez-Gonzalez, Endiya Klahr, MD   100 mg at 02/24/17 0755  . haloperidol (HALDOL) tablet 10 mg  10 mg Oral BID Beverly SessionsSubedi, Jagannath, MD   10 mg at 02/24/17 0755  . haloperidol (HALDOL) tablet 5 mg  5 mg Oral Q6H PRN Clapacs, John T, MD      . magnesium hydroxide (MILK OF MAGNESIA) suspension 30 mL  30 mL Oral Daily PRN Clapacs, John T, MD      . nicotine (NICODERM CQ - dosed in mg/24 hours) patch 21 mg  21 mg Transdermal Daily Jimmy FootmanHernandez-Gonzalez, Adison Jerger, MD   21 mg at 02/24/17 0754  . QUEtiapine (SEROQUEL) tablet 300 mg  300 mg Oral QHS Clapacs, John T, MD   300 mg at 02/23/17 2200  . traZODone (DESYREL) tablet 100 mg  100 mg Oral QHS PRN Clapacs, Jackquline DenmarkJohn T, MD   100 mg at 02/22/17 2137    Lab Results:  No results  found for this or any previous visit (from the past 48 hour(s)).  Blood Alcohol level:  Lab Results  Component Value Date   [facility] 02/20[facility]2017   ETH <11 07/17/2011    Metabolic Disorder Labs: Lab Results  Component Value Date   HGBA1C 4.4 (L) 02/22/2017   MPG 80 02/22/2017   Lab Results  Component Value Date   PROLACTIN 43.7 (H) 02/22/2017   Lab Results  Component Value Date   CHOL 138 02/22/2017   TRIG 79 02/22/2017   HDL 36 (L) 02/22/2017   CHOLHDL 3.8 02/22/2017   VLDL 16 02/22/2017   LDLCALC 86 02/22/2017    Physical Findings: AIMS: Facial and Oral Movements  Muscles of Facial Expression: None, normal Lips and Perioral Area: None, normal Jaw: None, normal Tongue: None, normal,Extremity Movements Upper (arms, wrists, hands, fingers): None, normal Lower (legs, knees, ankles, toes): None, normal, Trunk Movements Neck, shoulders, hips: None, normal, Overall Severity Severity of abnormal movements (highest score from questions above): None, normal Incapacitation due to abnormal movements: None, normal Patient's awareness of abnormal movements (rate only patient's report): No Awareness, Dental Status Current problems with teeth and/or dentures?: No Does patient usually wear dentures?: No  CIWA:  CIWA-Ar Total: 0 COWS:     Musculoskeletal: Strength & Muscle Tone: within normal limits Gait & Station: normal Patient leans: N/A  Psychiatric Specialty Exam: Physical Exam  Constitutional: He is oriented to person, place, and time. He appears well-developed and well-nourished.  HENT:  Head: Normocephalic and atraumatic.  Eyes: Conjunctivae and EOM are normal.  Neck: Normal range of motion.  Respiratory: Effort normal.  Musculoskeletal: Normal range of motion.  Neurological: He is alert and oriented to person, place, and time.    Review of Systems  Constitutional: Negative.   HENT: Negative.   Eyes: Negative.   Respiratory: Negative.   Cardiovascular: Negative.    Gastrointestinal: Negative.   Genitourinary: Negative.   Musculoskeletal: Negative.   Skin: Negative.   Neurological: Negative.   Endo/Heme/Allergies: Negative.   Psychiatric/Behavioral: Negative.     Blood pressure 99/66, pulse 77, temperature 98.5 F (36.9 C), temperature source Oral, resp. rate 18, height 5' 10.5" (1.791 m), weight 74.4 kg (164 lb), SpO2 96 %.Body mass index is 23.2 kg/m.  General Appearance: Fairly Groomed  Eye Contact:  Good  Speech:  Clear and Coherent  Volume:  Normal  Mood:  Euthymic  Affect:  Appropriate and Congruent  Thought Process:  Linear and Descriptions of Associations: Intact  Orientation:  Full (Time, Place, and Person)  Thought Content:  Hallucinations: None  Suicidal Thoughts:  No  Homicidal Thoughts:  No  Memory:  Immediate;   Fair Recent;   Fair Remote;   Fair  Judgement:  Poor  Insight:  Shallow  Psychomotor Activity:  Normal  Concentration:  Concentration: Fair and Attention Span: Fair  Recall:  [employer] of Knowledge:  Fair  Language:  Good  Akathisia:  No  Handed:    AIMS (if indicated):     Assets:  Manufacturing systems engineer Social Support  ADL's:  Intact  Cognition:  Impaired,  Mild  Sleep:  Number of Hours: 8.15     Treatment Plan Summary: Daily contact with patient to assess and evaluate symptoms and progress in treatment and Medication management    Schizophrenia: continue Haldol 10 mg twice a day. He received Haldol decanoate on June 3, 100 mg.  Prior to admission he was also prescribed with Seroquel 300 mg daily at bedtime--- continue Seroquel  EPS: continue amantadine 100 mg twice a day  Hypertension:  Discontinue  Norvasc 10 mg daily as his BP is low.  Tobacco use disorder continue nicotine patch 21 mg a day  Diet will be changed to regular  Precautions every 15 minute checks  Hospitalization and status involuntary  Follow-up will continue to follow-up with his outpatient psychiatrist  Disposition :  Possible discharge in the next 48 hrs back to the group home once stable.  Labs Will check hemoglobin A1c, lipid panel and TSH.   Jimmy Footman, MD 02/24/2017, 12:19 PM

## 2017-02-24 NOTE — Progress Notes (Signed)
Recreation Therapy Notes  INPATIENT RECREATION THERAPY ASSESSMENT  Patient Details Name: Adolm JosephJerome Walter Pembroke MRN: [id number] DOB: [birth year] Today's Date: 02/24/2017  Patient Stressors:  Patient verbalized no stressors.  Coping Skills:   Exercise, Art/Dance, Music, Sports  Personal Challenges: Expressing Yourself  Leisure Interests (2+):  Individual - Other (Comment) (Sports, basketball)  Awareness of Community Resources:  No  Community Resources:     Current Use:    If no, Barriers?:    Patient Strengths:  Creative, loving  Patient Identified Areas of Improvement:  No  Current Recreation Participation:  At Group Home  Patient Goal for Hospitalization:  To get out of here  [place]  [place] Residence:  [place]   Current SI (including self-harm):  No  Current HI:  No  Consent to Intern Participation: N/A  LRT set goals and shared them with patient. Patient reported he did not want to work on the goal. Due to patient's refusal, LRT will not develop a Recreational Therapy Care Plan at this time. If patient's status changes, LRT will develop a Recreational Therapy Care Plan.  Jacquelynn CreeGreene,Vitoria Conyer M, LRT/CTRS  02/24/2017, 3:56 PM

## 2017-02-24 NOTE — [facility] Group Notes (Signed)
[facility] LCSW Group Therapy  02/24/2017 3:51 PM  Type of Therapy:  Group Therapy  " Feelings around Diagnosis"   Participation Level:  Did Not Attend    Darrius Montano N 02/24/2017, 3:51 PM  

## 2017-02-25 MED ORDER — HALOPERIDOL DECANOATE 100 MG/ML IM SOLN: 100.0000 mg | INTRAMUSCULAR | 0 refills | Status: DC

## 2017-02-25 MED ORDER — TRAZODONE HCL 150 MG PO TABS: 150.0000 mg | ORAL_TABLET | Freq: Every day | ORAL | 0 refills | Status: DC

## 2017-02-25 MED ORDER — TRAZODONE HCL 50 MG PO TABS: 150.0000 mg | ORAL_TABLET | Freq: Every day | ORAL | Status: DC

## 2017-02-25 MED ORDER — TRAZODONE HCL 100 MG PO TABS
100.0000 mg | ORAL_TABLET | Freq: Every day | ORAL | Status: DC
  Administered 2017-02-25: 100 mg via ORAL
  Filled 2017-02-25: qty 1

## 2017-02-25 MED ORDER — AMANTADINE HCL 100 MG PO CAPS: 100.0000 mg | ORAL_CAPSULE | Freq: Two times a day (BID) | ORAL | 0 refills | Status: DC

## 2017-02-25 MED ORDER — HALOPERIDOL 10 MG PO TABS: 10.0000 mg | ORAL_TABLET | Freq: Two times a day (BID) | ORAL | 0 refills | Status: DC

## 2017-02-25 NOTE — Progress Notes (Signed)
D: Pt denies SI/HI/AVH. Pt is pleasant and cooperative with treatment care, he appears less anxious, he is not interacting with peers  Pt was offered support and encouragement. Pt was given scheduled medications. Pt was encouraged to attend groups. Q 15 minute checks were done for safety.  R: Pt did not attend group. Pt is complaint with medication. Pt receptive to treatment and safety maintained on unit.

## 2017-02-25 NOTE — Discharge Summary (Signed)
Physician Discharge Summary Note  Patient:  Roberto Vasquez is an 56 y.o., male MRN:  [id number] DOB:  05-07-1961 Patient phone:  [phone] (home)  Patient address:   [place] [address],  Total Time spent with patient: 30 minutes  Date of Admission:  02/21/2017 Date of Discharge: 02/26/17  Reason for Admission:  psychosis  Principal Problem: Schizophrenia [facility]) Discharge Diagnoses: Patient Active Problem List   Diagnosis Date Noted  . Tobacco use disorder [F17.200] 02/23/2017  . Schizophrenia ([place]) [F20.9] 02/20/2017  . Noncompliance [Z91.19] 02/20/2017  . HTN (hypertension) [I10] 03/01/2015    History of Present Illness: " I want to go my group home" Patient is a 56year old [place] h/o schizophrenia admitted involuntarily. Pt was brought to  emergency room on 6/1 and seen by Dr. Weber Cooks and at that time was judged to not need hospitalization and was to be discharged to his group home. When his ride came to pick him up he became acutely agitated began to express threats became very hostile mood became labile. He was not responding to appropriate verbal redirection. Appeared to be escalating to dangerousness. Per report, pt becoming increasingly verbally aggressive towards group home residents and staff. Patient recently eloped from day program.  Pt disorganized, unable to explain why he was agitated. Denies HI at this time. Denies SI.  Associated Signs/Symptoms: Depression Symptoms:  psychomotor agitation, anxiety, (Hypo) Manic Symptoms:  Distractibility, Impulsivity, Irritable Mood, Labiality of Mood, Anxiety Symptoms:  Excessive Worry, Psychotic Symptoms:  paranoid PTSD Symptoms:  Total Time spent with patient: 1 hour  Past Psychiatric History:  long history of schizophrenia. Several prior hospitalizations. Denies past history of suicidal or homicidal behavior. Evidently has been pretty stable for years. He's had several encounters at the  hospital over the last several years for medical issues and psychiatric problems never seem to of been seen as a impediment to treatment. He is on Haldol Decanoate, oral Haldol and oral Seroquel. History of hostility and anger especially when psychotic. Subsequent to his being discharged we were informed by his outpatient treatment team that he probably did not receive his haloperidol decanoate injection last month either making him more behind on his medicine and we had thought  Past Medical History:  Past Medical History:  Diagnosis Date  . Anxiety   . Hepatitis C   . Hypertension   . Schizophrenia ([place])   . Seizures ([place])     Past Surgical History:  Procedure Laterality Date  . ABDOMINAL SURGERY    . VENTRAL HERNIA REPAIR N/A 10/20/2016   Procedure: HERNIA REPAIR VENTRAL ADULT;  Surgeon: Florene Glen, MD;  Location: [facility] ORS;  Service: General;  Laterality: N/A;   Family History:  Family History  Problem Relation Age of Onset  . Hypertension Mother    Family Psychiatric  History:unknown   Social History:  History  Alcohol Use No     History  Drug Use No    Social History   Social History  . Marital status: Single    Spouse name: N/A  . Number of children: N/A  . Years of education: N/A   Social History Main Topics  . Smoking status: Current Every Day Smoker    Packs/day: 1.00    Years: 40.00    Types: Cigarettes  . Smokeless tobacco: Never Used  . Alcohol use No  . Drug use: No  . Sexual activity: Not Asked   Other Topics Concern  . None   Social History  Narrative  . None    Hospital Course:    Patient has been living in a group home for years however he eloped a couple weeks ago and was living on the streets. During that time he was noncompliant with any of his medications. He was found and brought back to the group home just a couple days ago. Over the weekend he got into a verbal altercation with another resident. The patient was brought into the  ER for evaluation. The ER psychiatrist saw him and felt patient was safe to be discharged back to the group home. Once staff from the group home came to pick him up he became belligerent, aggressive and agitated and started yelling that he was, kill them.  Schizophrenia: continue Haldol 10 mg twice a day. He received Haldol decanoate on June 3, 100 mg.  Prior to admission he was also prescribed with Seroquel 300 mg daily at bedtime---I will discontinue this medication as the patient has been hypotensive for the last 3 days  Insomnia the patient will be prescribed trazodone 150 mg by mouth daily at bedtime.  EPS: continue amantadine 100 mg twice a day  Hypertension:  Oral antihypertensive had been discontinued as patient has actually been hypotensive  Tobacco use disorder: pt received nicotine patch 21 mg a day  Follow-up will continue to follow-up with his outpatient psychiatrist  Disposition: Will discharge back to his group home today  Labs: TSH, hemoglobin A1c, lipid panel are within the normal limits  During his stay in the unit the patient was actually very pleasant, calm and cooperative. He did not display any agitation, aggression or disruptive behavior. He complied with all of his medications.  Today he reports feeling very well. He denies problems with mood appetite, energy, sleep or concentration. He denies hopelessness, helplessness suicidality, homicidality or having auditory or visual hallucinations.  During examination there is no evidence of delusions, or paranoid thinking, mania or hypomania. He is tolerating well medications. He denies any side effects.  Staff working with the patient does not voice any concerns about his safety or the safety of others upon discharge. Staff feels patient is doing very well and ready for discharge.  His participation in programming was minimal. The patient stated for the most part in his room. He had minimal interactions with  peers.  He does not have any access to guns   Physical Findings: AIMS: Facial and Oral Movements Muscles of Facial Expression: None, normal Lips and Perioral Area: None, normal Jaw: None, normal Tongue: None, normal,Extremity Movements Upper (arms, wrists, hands, fingers): None, normal Lower (legs, knees, ankles, toes): None, normal, Trunk Movements Neck, shoulders, hips: None, normal, Overall Severity Severity of abnormal movements (highest score from questions above): None, normal Incapacitation due to abnormal movements: None, normal Patient's awareness of abnormal movements (rate only patient's report): No Awareness, Dental Status Current problems with teeth and/or dentures?: No Does patient usually wear dentures?: No  CIWA:  CIWA-Ar Total: 0 COWS:     Musculoskeletal: Strength & Muscle Tone: within normal limits Gait & Station: normal Patient leans: N/A  Psychiatric Specialty Exam: Physical Exam  Constitutional: He is oriented to person, place, and time. He appears well-developed and well-nourished.  HENT:  Head: Normocephalic and atraumatic.  Eyes: Conjunctivae and EOM are normal.  Neck: Normal range of motion.  Respiratory: Effort normal.  Musculoskeletal: Normal range of motion.  Neurological: He is alert and oriented to person, place, and time.    Review of Systems  Constitutional:  Negative.   HENT: Negative.   Eyes: Negative.   Respiratory: Negative.   Cardiovascular: Negative.   Gastrointestinal: Negative.   Genitourinary: Negative.   Musculoskeletal: Negative.   Skin: Negative.   Neurological: Negative.   Endo/Heme/Allergies: Negative.   Psychiatric/Behavioral: Negative.     Blood pressure 91/63, pulse 78, temperature 97.8 F (36.6 C), resp. rate 18, height 5' 10.5" (1.791 m), weight 74.4 kg (164 lb), SpO2 96 %.Body mass index is 23.2 kg/m.  General Appearance: Well Groomed  Eye Contact:  Good  Speech:  Clear and Coherent  Volume:  Normal  Mood:   Euthymic  Affect:  Appropriate and Congruent  Thought Process:  Linear and Descriptions of Associations: Intact  Orientation:  Full (Time, Place, and Person)  Thought Content:  Hallucinations: None  Suicidal Thoughts:  No  Homicidal Thoughts:  No  Memory:  Immediate;   Fair Recent;   Fair Remote;   Fair  Judgement:  Poor  Insight:  Shallow  Psychomotor Activity:  Normal  Concentration:  Concentration: Fair and Attention Span: Fair  Recall:  [place] of Knowledge:  Good  Language:  Good  Akathisia:  No  Handed:    AIMS (if indicated):     Assets:  Communication Skills Social Support  ADL's:  Intact  Cognition:  WNL  Sleep:  Number of Hours: 6.5     Have you used any form of tobacco in the last 30 days? (Cigarettes, Smokeless Tobacco, Cigars, and/or Pipes): Yes  Has this patient used any form of tobacco in the last 30 days? (Cigarettes, Smokeless Tobacco, Cigars, and/or Pipes) Yes, Yes, A prescription for an FDA-approved tobacco cessation medication was offered at discharge and the patient refused  Blood Alcohol level:  Lab Results  Component Value Date   [facility] <5 02/20/2017   ETH <11 63/09/6008    Metabolic Disorder Labs:  Lab Results  Component Value Date   HGBA1C 4.4 (L) 02/22/2017   MPG 80 02/22/2017   Lab Results  Component Value Date   PROLACTIN 43.7 (H) 02/22/2017   Lab Results  Component Value Date   CHOL 138 02/22/2017   TRIG 79 02/22/2017   HDL 36 (L) 02/22/2017   CHOLHDL 3.8 02/22/2017   VLDL 16 02/22/2017   LDLCALC 86 02/22/2017   Results for BINH, DOTEN (MRN [id number]) as of 02/26/2017 07:41  Ref. Range 02/20/2017 08:42 02/20/2017 16:12 02/22/2017 06:50  COMPREHENSIVE METABOLIC PANEL Unknown Rpt (A)    Sodium Latest Ref Range: 135 - 145 mmol/L 139    Potassium Latest Ref Range: 3.5 - 5.1 mmol/L 3.1 (L)    Chloride Latest Ref Range: 101 - 111 mmol/L 105    CO2 Latest Ref Range: 22 - 32 mmol/L 27    Glucose Latest Ref Range: 65 - 99 mg/dL  108 (H)    Mean Plasma Glucose Latest Units: mg/dL   80  BUN Latest Ref Range: 6 - 20 mg/dL 10    Creatinine Latest Ref Range: 0.61 - 1.24 mg/dL 1.43 (H)    Calcium Latest Ref Range: 8.9 - 10.3 mg/dL 9.1    Anion gap Latest Ref Range: 5 - 15  7    Alkaline Phosphatase Latest Ref Range: 38 - 126 U/L 61    Albumin Latest Ref Range: 3.5 - 5.0 g/dL 3.6    AST Latest Ref Range: 15 - 41 U/L 25    ALT Latest Ref Range: 17 - 63 U/L 16 (L)    Total Protein Latest  Ref Range: 6.5 - 8.1 g/dL 7.7    Total Bilirubin Latest Ref Range: 0.3 - 1.2 mg/dL 0.7    EGFR (African American) Latest Ref Range: >60 mL/min >60    EGFR (Non-African Amer.) Latest Ref Range: >60 mL/min 53 (L)    Total CHOL/HDL Ratio Latest Units: RATIO   3.8  Cholesterol Latest Ref Range: 0 - 200 mg/dL   138  HDL Cholesterol Latest Ref Range: >40 mg/dL   36 (L)  LDL (calc) Latest Ref Range: 0 - 99 mg/dL   86  Triglycerides Latest Ref Range: <150 mg/dL   79  VLDL Latest Ref Range: 0 - 40 mg/dL   16  WBC Latest Ref Range: 3.8 - 10.6 K/uL 5.9    RBC Latest Ref Range: 4.40 - 5.90 MIL/uL 3.88 (L)    Hemoglobin Latest Ref Range: 13.0 - 18.0 g/dL 12.6 (L)    HCT Latest Ref Range: 40.0 - 52.0 % 36.6 (L)    MCV Latest Ref Range: 80.0 - 100.0 fL 94.2    MCH Latest Ref Range: 26.0 - 34.0 pg 32.4    MCHC Latest Ref Range: 32.0 - 36.0 g/dL 34.4    RDW Latest Ref Range: 11.5 - 14.5 % 12.7    Platelets Latest Ref Range: 150 - 440 K/uL 228    Acetaminophen (Tylenol), S Latest Ref Range: 10 - 30 ug/mL <29 (L)    Salicylate Lvl Latest Ref Range: 2.8 - 30.0 mg/dL <7.0    Prolactin Latest Ref Range: 4.0 - 15.2 ng/mL   43.7 (H)  Hemoglobin A1C Latest Ref Range: 4.8 - 5.6 %   4.4 (L)  TSH Latest Ref Range: 0.350 - 4.500 uIU/mL   0.474  Alcohol, Ethyl (B) Latest Ref Range: <5 mg/dL <5    Amphetamines, Ur Screen Latest Ref Range: NONE DETECTED  NONE DETECTED    Barbiturates, Ur Screen Latest Ref Range: NONE DETECTED  NONE DETECTED    Benzodiazepine,  Ur Scrn Latest Ref Range: NONE DETECTED  NONE DETECTED    Cocaine Metabolite,Ur [place] Latest Ref Range: NONE DETECTED  NONE DETECTED    Methadone Scn, Ur Latest Ref Range: NONE DETECTED  NONE DETECTED    MDMA (Ecstasy)Ur Screen Latest Ref Range: NONE DETECTED  NONE DETECTED    Cannabinoid 50 Ng, Ur [place] Latest Ref Range: NONE DETECTED  NONE DETECTED    Opiate, Ur Screen Latest Ref Range: NONE DETECTED  NONE DETECTED    Phencyclidine (PCP) Ur S Latest Ref Range: NONE DETECTED  NONE DETECTED    Tricyclic, Ur Screen Latest Ref Range: NONE DETECTED  POSITIVE (A)    EKG 12-LEAD Unknown  Rpt    See Psychiatric Specialty Exam and Suicide Risk Assessment completed by Attending Physician prior to discharge.  Discharge destination:  Other:  Group Home  Is patient on multiple antipsychotic therapies at discharge:  Yes,   Do you recommend tapering to monotherapy for antipsychotics?  Yes   Has Patient had three or more failed trials of antipsychotic monotherapy by history:  No  Recommended Plan for Multiple Antipsychotic Therapies: Taper to monotherapy as described:  gradually decrease oral haldol   Allergies as of 02/26/2017   No Known Allergies     Medication List    STOP taking these medications   amLODipine 10 MG tablet Commonly known as:  NORVASC   benztropine 1 MG tablet Commonly known as:  COGENTIN   hydrochlorothiazide 12.5 MG capsule Commonly known as:  MICROZIDE   lisinopril 20  MG tablet Commonly known as:  PRINIVIL,ZESTRIL   QUEtiapine 100 MG tablet Commonly known as:  SEROQUEL   QUEtiapine 300 MG tablet Commonly known as:  SEROQUEL     TAKE these medications     Indication  amantadine 100 MG capsule Commonly known as:  SYMMETREL Take 1 capsule (100 mg total) by mouth 2 (two) times daily.  Indication:  Extrapyramidal Reaction caused by Medications   haloperidol 10 MG tablet Commonly known as:  HALDOL Take 1 tablet (10 mg total) by mouth 2 (two) times daily. What  changed:  Another medication with the same name was removed. Continue taking this medication, and follow the directions you see here.  Indication:  Schizophrenia   haloperidol decanoate 100 MG/ML injection Commonly known as:  HALDOL DECANOATE Inject 1 mL (100 mg total) into the muscle every 30 (thirty) days. Start taking on:  03/20/2017 What changed:  when to take this  Indication:  Schizophrenia   traZODone 100 MG tablet Commonly known as:  DESYREL Take 1 tablet (100 mg total) by mouth at bedtime.  Indication:  Trouble Sleeping      Follow-up Information    Pc, [facility] Follow up on 04/01/2017.   Why:  at 12:00pm for your next injection. You may walk-in any time before this appointment for further care.  Contact information: [place] [id number] [phone]         >30 minutes. >50 % of the time was spent in coordination of care  Signed: Hildred Priest, MD 02/26/2017, 7:41 AM

## 2017-02-25 NOTE — Plan of Care (Signed)
Problem: Pain Managment: Goal: General experience of comfort will improve Outcome: Progressing Patient continues to deny any pain

## 2017-02-25 NOTE — [facility] Suicide Risk Assessment (Signed)
[facility]   Principal Problem: Schizophrenia [facility](HCC) Discharge Diagnoses:  Patient Active Problem List   Diagnosis Date Noted  . Tobacco use disorder [F17.200] 02/23/2017  . Schizophrenia (HCC) [F20.9] 02/20/2017  . Noncompliance [Z91.19] 02/20/2017  . HTN (hypertension) [I10] 03/01/2015     Psychiatric Specialty Exam: ROS  Blood pressure 91/63, pulse 78, temperature 97.8 F (36.6 C), resp. rate 18, height 5' 10.5" (1.791 m), weight 74.4 kg (164 lb), SpO2 96 %.Body mass index is 23.2 kg/m.                                                       Mental Status Per Nursing Assessment::   On Admission:     Demographic Factors:  Male  Loss Factors: Decrease in vocational status  Historical Factors: Impulsivity  Risk Reduction Factors:   Living with another person, especially a relative and Positive social support  No access to guns  Continued Clinical Symptoms:  Schizophrenia:   Paranoid or undifferentiated type Previous Psychiatric Diagnoses and Treatments  Cognitive Features That Contribute To Risk:  Closed-mindedness and Loss of executive function    Suicide Risk:  Minimal: No identifiable suicidal ideation.  Patients presenting with no risk factors but with morbid ruminations; may be classified as minimal risk based on the severity of the depressive symptoms  Follow-up Information    Pc, [facility] Follow up on 04/01/2017.   Why:  at 12:00pm for your next injection. You may walk-in any time before this appointment for further care.  Contact information: [address] [place] [address] [phone]            Jimmy FootmanHernandez-Gonzalez,  Jaquari Reckner, MD 02/26/2017, 7:42 AM

## 2017-02-25 NOTE — [place] FL2 (Addendum)
  [place] MEDICAID FL2 LEVEL OF CARE SCREENING TOOL     IDENTIFICATION  Patient Name: Roberto Vasquez Birthdate: 1961-04-02 Sex: male Admission Date (Current Location): 02/21/2017  [place]  [place] [id number] [facility] Facility and Address:  [facility], [address], [place], [place] [address]      Provider Number: [id number]  Attending Physician Name and Address:  Van ClinesHernandez-Gonzalez, Andr*  Relative Name and Phone Number:  [place] PinkFrancis [place] [phone] (group home staff)    Current Level of Care: Hospital Recommended Level of Care: Family Care Home Prior Approval Number:    Date Approved/Denied:   PASRR Number:    Discharge Plan: Other (Comment) [facility])    Current Diagnoses: Patient Active Problem List   Diagnosis Date Noted  . Tobacco use disorder 02/23/2017  . Schizophrenia (HCC) 02/20/2017  . Noncompliance 02/20/2017  . HTN (hypertension) 03/01/2015    Orientation RESPIRATION BLADDER Height & Weight     Self, Time, Situation, Place  Normal Continent Weight: 164 lb (74.4 kg) Height:  5' 10.5" (179.1 cm)  BEHAVIORAL SYMPTOMS/MOOD NEUROLOGICAL BOWEL NUTRITION STATUS  Verbally abusive  (N/A) Continent Diet (Normal)  AMBULATORY STATUS COMMUNICATION OF NEEDS Skin   Independent Verbally Normal                       Personal Care Assistance Level of Assistance   (N/A)           Functional Limitations Info   (WNL)          SPECIAL CARE FACTORS FREQUENCY                       Contractures Contractures Info: Not present    Additional Factors Info  Psychotropic     Psychotropic Info: see below         Current Medications (02/25/2017):  This is the current hospital active medication list Current Facility-Administered Medications  Medication Dose Route Frequency Provider Last Rate Last Dose  . acetaminophen (TYLENOL) tablet 650 mg  650 mg Oral Q6H PRN Clapacs, John T, MD       . alum & mag hydroxide-simeth (MAALOX/MYLANTA) 200-200-20 MG/5ML suspension 30 mL  30 mL Oral Q4H PRN Clapacs, John T, MD      . amantadine (SYMMETREL) capsule 100 mg  100 mg Oral BID Jimmy FootmanHernandez-Gonzalez, Sreekar Broyhill, MD   100 mg at 02/25/17 0740  . haloperidol (HALDOL) tablet 10 mg  10 mg Oral BID Beverly SessionsSubedi, Jagannath, MD   10 mg at 02/25/17 0740  . haloperidol (HALDOL) tablet 5 mg  5 mg Oral Q6H PRN Clapacs, John T, MD      . magnesium hydroxide (MILK OF MAGNESIA) suspension 30 mL  30 mL Oral Daily PRN Clapacs, John T, MD      . nicotine (NICODERM CQ - dosed in mg/24 hours) patch 21 mg  21 mg Transdermal Daily Jimmy FootmanHernandez-Gonzalez, Caia Lofaro, MD   21 mg at 02/24/17 0754  . traZODone (DESYREL) tablet 100 mg  100 mg Oral QHS Jimmy FootmanHernandez-Gonzalez, Darrian Grzelak, MD         Discharge Medications: Please see discharge summary for a list of discharge medications.  Relevant Imaging Results:  Relevant Lab Results:   Additional Information    Verdene LennertLauren C Newton, LCSW

## 2017-02-25 NOTE — Progress Notes (Signed)
Recreation Therapy Notes  Date: 06.06.18 Time: 9:30 am Location: Craft Room  Group Topic: Self-esteem  Goal Area(s) Addresses: Patient will write at least one positive trait about self. Patient will verbalize benefit of having a healthy self-esteem.   Behavioral Response: Did not attend  Intervention: I Am   Activity: Patients were given a worksheet with the letter I on it and were instructed to write as many positive traits about themselves inside the letter.  Education: LRT educated patients on ways to increase their self-esteem.   Education Outcome: Patient did not attend group.   Clinical Observations/Feedback: Patient did not attend group.  Adyan Palau M, LRT/CTRS 02/25/2017 9:57 AM 

## 2017-02-25 NOTE — Progress Notes (Signed)
[facility] MD Progress Note  02/25/2017 9:55 AM Roberto Vasquez  MRN:  [id number] Subjective:  " I want to go my group home" Patient is a 56year old malewith h/o schizophrenia admitted involuntarily. Pt was brought to  emergency room on 6/1 and seen by Dr. Toni Amend and at that time was judged to not need hospitalization and was to be discharged to his group home. When his ride came to pick him up he became acutely agitated began to express threats became very hostile mood became labile. He was not responding to appropriate verbal redirection. Appeared to be escalating to dangerousness. Per report, pt becoming increasingly verbally aggressive towards group home residents and staff. Patient recently eloped from day program.  Pt disorganized, unable to explain why he was agitated. Denies HI at this time. Denies SI.   6/4 patient was calm and cooperative today. He tells me he does not have any problems with mood, appetite, energy, sleep or concentration. He denies any suicidality, homicidality or having auditory or visual hallucinations. He denies having physical complaints or any side effects from his medications. Says that he feels ready to go home.  Patient has been living in a group home for years however he eloped a couple weeks ago and was living on the streets. During that time he was noncompliant with any of his medications. He was found and brought back to the group home just a couple days ago. Over the weekend he got into a verbal altercation with another resident. The patient was brought into the ER for evaluation. The ER psychiatrist saw him and felt patient was safe to be discharged back to the group home. Once staff from the group home came to pick him up he became belligerent, aggressive and agitated and started yelling that he was, kill them.  6/5 Patient was lying in bed. [name] he is doing fine today. He said he slept well and is eating well. Denies any medication side effects. Patient denies  hallucinations, homocidality, suicidality.  He is looking forward to being discharged to the group[ home.  6/6 patient continues to be withdrawn, stays in his room all day. He is not participating in any programming. He is pleasant, calm and cooperative. He is compliant with medications. He denies any suicidality, homicidality or auditory or visual hallucinations. Denies any side effects from medications. He denies any physical complaints. She is okay with plans for discharge back to his group home. He does not voice any desire to want to harm them.  Per nursing: D: Pt denies SI/HI/AVH. Pt is pleasant and cooperative with treatment care, he appears less anxious, he is not interacting with peers  Pt was offered support and encouragement. Pt was given scheduled medications. Pt was encouraged to attend groups. Q 15 minute checks were done for safety.  R: Pt did not attend group. Pt is complaint with medication. Pt receptive to treatment and safety maintained on unit.  Principal Problem: Schizophrenia (HCC) Diagnosis:   Patient Active Problem List   Diagnosis Date Noted  . Tobacco use disorder [F17.200] 02/23/2017  . Schizophrenia (HCC) [F20.9] 02/20/2017  . Noncompliance [Z91.19] 02/20/2017  . HTN (hypertension) [I10] 03/01/2015   Total Time spent with patient: 30 minutes  Past Psychiatric History: long history of schizophrenia. Several prior hospitalizations. Denies past history of suicidal or homicidal behavior. Evidently has been pretty stable for years. He's had several encounters at the hospital over the last several years for medical issues and psychiatric problems never seem to of  been seen as a impediment to treatment. He is on Haldol Decanoate, oral Haldol and oral Seroquel. History of hostility and anger especially when psychotic. Subsequent to his being discharged we were informed by his outpatient treatment team that he probably did not receive his haloperidol decanoate injection last  month either making him more behind on his medicine and we had thought   Past Medical History:  Past Medical History:  Diagnosis Date  . Anxiety   . Hepatitis C   . Hypertension   . Schizophrenia (HCC)   . Seizures (HCC)     Past Surgical History:  Procedure Laterality Date  . ABDOMINAL SURGERY    . VENTRAL HERNIA REPAIR N/A 10/20/2016   Procedure: HERNIA REPAIR VENTRAL ADULT;  Surgeon: Lattie Haw, MD;  Location: [facility] ORS;  Service: General;  Laterality: N/A;   Family History:  Family History  Problem Relation Age of Onset  . Hypertension Mother    Family Psychiatric  History: unknown  Social History:  History  Alcohol Use No     History  Drug Use No    Social History   Social History  . Marital status: Single    Spouse name: N/A  . Number of children: N/A  . Years of education: N/A   Social History Main Topics  . Smoking status: Current Every Day Smoker    Packs/day: 1.00    Years: 40.00    Types: Cigarettes  . Smokeless tobacco: Never Used  . Alcohol use No  . Drug use: No  . Sexual activity: Not Asked   Other Topics Concern  . None   Social History Narrative  . None     Current Medications: Current Facility-Administered Medications  Medication Dose Route Frequency Provider Last Rate Last Dose  . acetaminophen (TYLENOL) tablet 650 mg  650 mg Oral Q6H PRN Clapacs, John T, MD      . alum & mag hydroxide-simeth (MAALOX/MYLANTA) 200-200-20 MG/5ML suspension 30 mL  30 mL Oral Q4H PRN Clapacs, John T, MD      . amantadine (SYMMETREL) capsule 100 mg  100 mg Oral BID Jimmy Footman, MD   100 mg at 02/25/17 0740  . haloperidol (HALDOL) tablet 10 mg  10 mg Oral BID Beverly Sessions, MD   10 mg at 02/25/17 0740  . haloperidol (HALDOL) tablet 5 mg  5 mg Oral Q6H PRN Clapacs, John T, MD      . magnesium hydroxide (MILK OF MAGNESIA) suspension 30 mL  30 mL Oral Daily PRN Clapacs, John T, MD      . nicotine (NICODERM CQ - dosed in mg/24 hours)  patch 21 mg  21 mg Transdermal Daily Jimmy Footman, MD   21 mg at 02/24/17 0754  . traZODone (DESYREL) tablet 150 mg  150 mg Oral QHS Jimmy Footman, MD        Lab Results:  No results found for this or any previous visit (from the past 48 hour(s)).  Blood Alcohol level:  Lab Results  Component Value Date   [facility] <5 02/20/2017   ETH <11 07/17/2011    Metabolic Disorder Labs: Lab Results  Component Value Date   HGBA1C 4.4 (L) 02/22/2017   MPG 80 02/22/2017   Lab Results  Component Value Date   PROLACTIN 43.7 (H) 02/22/2017   Lab Results  Component Value Date   CHOL 138 02/22/2017   TRIG 79 02/22/2017   HDL 36 (L) 02/22/2017   CHOLHDL 3.8 02/22/2017   VLDL 16  02/22/2017   LDLCALC 86 02/22/2017    Physical Findings: AIMS: Facial and Oral Movements Muscles of Facial Expression: None, normal Lips and Perioral Area: None, normal Jaw: None, normal Tongue: None, normal,Extremity Movements Upper (arms, wrists, hands, fingers): None, normal Lower (legs, knees, ankles, toes): None, normal, Trunk Movements Neck, shoulders, hips: None, normal, Overall Severity Severity of abnormal movements (highest score from questions above): None, normal Incapacitation due to abnormal movements: None, normal Patient's awareness of abnormal movements (rate only patient's report): No Awareness, Dental Status Current problems with teeth and/or dentures?: No Does patient usually wear dentures?: No  CIWA:  CIWA-Ar Total: 0 COWS:     Musculoskeletal: Strength & Muscle Tone: within normal limits Gait & Station: normal Patient leans: N/A  Psychiatric Specialty Exam: Physical Exam  Constitutional: He is oriented to person, place, and time. He appears well-developed and well-nourished.  HENT:  Head: Normocephalic and atraumatic.  Eyes: Conjunctivae and EOM are normal.  Neck: Normal range of motion.  Respiratory: Effort normal.  Musculoskeletal: Normal range of  motion.  Neurological: He is alert and oriented to person, place, and time.    Review of Systems  Constitutional: Negative.   HENT: Negative.   Eyes: Negative.   Respiratory: Negative.   Cardiovascular: Negative.   Gastrointestinal: Negative.   Genitourinary: Negative.   Musculoskeletal: Negative.   Skin: Negative.   Neurological: Negative.   Endo/Heme/Allergies: Negative.   Psychiatric/Behavioral: Negative.     Blood pressure (!) 87/58, pulse 80, temperature 98.3 F (36.8 C), temperature source Oral, resp. rate 18, height 5' 10.5" (1.791 m), weight 74.4 kg (164 lb), SpO2 96 %.Body mass index is 23.2 kg/m.  General Appearance: Fairly Groomed  Eye Contact:  Good  Speech:  Clear and Coherent  Volume:  Normal  Mood:  Euthymic  Affect:  Appropriate and Congruent  Thought Process:  Linear and Descriptions of Associations: Intact  Orientation:  Full (Time, Place, and Person)  Thought Content:  Hallucinations: None  Suicidal Thoughts:  No  Homicidal Thoughts:  No  Memory:  Immediate;   Fair Recent;   Fair Remote;   Fair  Judgement:  Poor  Insight:  Shallow  Psychomotor Activity:  Normal  Concentration:  Concentration: Fair and Attention Span: Fair  Recall:  FiservFair  Fund of Knowledge:  Fair  Language:  Good  Akathisia:  No  Handed:    AIMS (if indicated):     Assets:  Manufacturing systems engineerCommunication Skills Social Support  ADL's:  Intact  Cognition:  Impaired,  Mild  Sleep:  Number of Hours: 6.15     Treatment Plan Summary: Daily contact with patient to assess and evaluate symptoms and progress in treatment and Medication management    Schizophrenia: continue Haldol 10 mg twice a day. He received Haldol decanoate on June 3, 100 mg.  Prior to admission he was also prescribed with Seroquel 300 mg daily at bedtime---I will discontinue this medication as the patient has been hypotensive for the last 3 days  Insomnia the patient will be prescribed trazodone 100 mg by mouth daily at  bedtime.  EPS: continue amantadine 100 mg twice a day  Hypertension:  Oral antihypertensive had been discontinued as patient has actually been hypotensive  Tobacco use disorder continue nicotine patch 21 mg a day  Diet regular  Precautions every 15 minute checks  Hospitalization and status involuntary  Follow-up will continue to follow-up with his outpatient psychiatrist  Disposition : Possible discharge in the next 24h. will be discharged back to his  group home  Labs: TSH, hemoglobin A1c, lipid panel are within the normal limits   Jimmy Footman, MD 02/25/2017, 9:55 AM

## 2017-02-25 NOTE — Progress Notes (Signed)
Patient continues to remain in bed resting with eyes closed. Patient encouraged to attend meals and classes but states, "Why do I have to go to class, I'm leaving tomorrow?" Took po meds but refused the nicotine patch, stated, "I don't need it." Denies SI, AVH and pain. Not observed interacting with peers during meals Patient receptive to treatment and safety maintained on unit with every 15 minute checks.

## 2017-02-25 NOTE — [facility] Suicide Risk Assessment (Deleted)
[facility] INPATIENT:  Family/Significant Other Suicide Prevention Education  Suicide Prevention Education:  Education Completed; [place] PinkFrancis [place], Pt's group home staff [phone], has been identified by the patient as the family member/significant other with whom the patient will be residing, and identified as the person(s) who will aid the patient in the event of a mental health crisis (suicidal ideations/suicide attempt).  With written consent from the patient, the family member/significant other has been provided the following suicide prevention education, prior to the and/or following the discharge of the patient.  The suicide prevention education provided includes the following:  Suicide risk factors  Suicide prevention and interventions  National Suicide Hotline telephone number  [facility] assessment telephone number  [facility] Emergency Assistance 911  [facility] and/or Residential Mobile Crisis Unit telephone number  Request made of family/significant other to:  Remove weapons (e.g., guns, rifles, knives), all items previously/currently identified as safety concern.    Remove drugs/medications (over-the-counter, prescriptions, illicit drugs), all items previously/currently identified as a safety concern.  The family member/significant other verbalizes understanding of the suicide prevention education information provided.  The family member/significant other agrees to remove the items of safety concern listed above.  Verdene LennertLauren C Zachari Alberta 02/25/2017, 10:44 AM

## 2017-02-25 NOTE — Progress Notes (Signed)
CSW spoke with Thelma [facility]Francis [facility] [phone]) who is agreeable to pick Pt up tomorrow. CSW and Thelma BargeFrancis set tentative DC time for 1:00pm. She would like a call in the AM to confirm.  [facility].  Vernie ShanksLauren Jeanice Dempsey, LCSW Clinical Social Work [phone]

## 2017-02-26 MED ORDER — HALOPERIDOL DECANOATE 100 MG/ML IM SOLN: 100.0000 mg | INTRAMUSCULAR | 0 refills | Status: DC

## 2017-02-26 MED ORDER — HALOPERIDOL 10 MG PO TABS: 10.0000 mg | ORAL_TABLET | Freq: Two times a day (BID) | ORAL | 0 refills | Status: DC

## 2017-02-26 MED ORDER — TRAZODONE HCL 100 MG PO TABS: 100.0000 mg | ORAL_TABLET | Freq: Every day | ORAL | 0 refills | Status: DC

## 2017-02-26 MED ORDER — AMANTADINE HCL 100 MG PO CAPS: 100.0000 mg | ORAL_CAPSULE | Freq: Two times a day (BID) | ORAL | 0 refills | Status: DC

## 2017-02-26 NOTE — Progress Notes (Signed)
  [facility] :  Will you be returning to the same living situation after discharge:  Yes,  return to group home. At discharge, do you have transportation home?: Yes,  group home director Do you have the ability to pay for your medications: Yes,  Medicaid/Medicare  Release of information consent forms completed and in the chart;  Patient's signature needed at discharge.  Patient to Follow up at: Follow-up Information    [facility] on 04/01/2017.   Why:  at 12:00pm for your next injection. You may walk-in any time before this appointment for further care.  Contact information: [address] [place] [address] [phone]           Next level of care provider has access to [facility] Link:no  Safety Planning and Suicide Prevention discussed: Yes,  SPE completed with patient and group home director.  Have you used any form of tobacco in the last 30 days? (Cigarettes, Smokeless Tobacco, Cigars, and/or Pipes): Yes  Has patient been referred to the Quitline?: Patient refused referral  Patient has been referred for addiction treatment: N/A  Lynden OxfordKadijah R Josseline Reddin, MSW, LCSW-A 02/26/2017, 9:48 AM

## 2017-02-26 NOTE — [facility] Group Notes (Signed)
[facility] LCSW Group Therapy Note  Type of Therapy and Topic:  Group Therapy:  Goals Group: SMART Goals  Participation Level:  Patient did not attend group. CSW invited patient to group.   Description of Group:   The purpose of a daily goals group is to assist and guide patients in setting recovery/wellness-related goals.  The objective is to set goals as they relate to the crisis in which they were admitted. Patients will be using SMART goal modalities to set measurable goals.  Characteristics of realistic goals will be discussed and patients will be assisted in setting and processing how one will reach their goal. Facilitator will also assist patients in applying interventions and coping skills learned in psycho-education groups to the SMART goal and process how one will achieve defined goal.  Therapeutic Goals: -Patients will develop and document one goal related to or their crisis in which brought them into treatment. -Patients will be guided by LCSW using SMART goal setting modality in how to set a measurable, attainable, realistic and time sensitive goal.  -Patients will process barriers in reaching goal. -Patients will process interventions in how to overcome and successful in reaching goal.   Summary of Patient Progress:  Patient Goal: Patient did not attend group. CSW invited patient to group.   Therapeutic Modalities:   Motivational Interviewing Engineer, manufacturing systemsCognitive Behavioral Therapy Crisis Intervention Model SMART goals setting  Jayro Mcmath G. Garnette CzechSampson MSW, LCSWA 02/26/2017 10:51 AM

## 2017-02-26 NOTE — [facility] Group Notes (Signed)
  [facility] LCSW Group Therapy Note  Date/Time: 02/25/17, 1300 late entry note  Type of Therapy/Topic:  Group Therapy:  Emotion Regulation  Participation Level:  Did Not Attend   Mood:  Description of Group:    The purpose of this group is to assist patients in learning to regulate negative emotions and experience positive emotions. Patients will be guided to discuss ways in which they have been vulnerable to their negative emotions. These vulnerabilities will be juxtaposed with experiences of positive emotions or situations, and patients challenged to use positive emotions to combat negative ones. Special emphasis will be placed on coping with negative emotions in conflict situations, and patients will process healthy conflict resolution skills.  Therapeutic Goals: 1. Patient will identify two positive emotions or experiences to reflect on in order to balance out negative emotions:  2. Patient will label two or more emotions that they find the most difficult to experience:  3. Patient will be able to demonstrate positive conflict resolution skills through discussion or role plays:   Summary of Patient Progress:       Therapeutic Modalities:   Cognitive Behavioral Therapy Feelings Identification Dialectical Behavioral Therapy  Greg Skylan Gift, LCSW 

## 2017-02-26 NOTE — Progress Notes (Signed)
D: Pt at the time of assessment denied any form of depression, anxiety, pain, SI, HI or AVH; states, "It don't get any better than this; I feel good." Pt was flat isolative and withdrawn to room; remained in bed all evening. Pt was calm and cooperative. A: Medications offered as prescribed. All patient's questions and concerns addressed. Support, encouragement, and safe environment provided. Will continue to monitor for any form of change. 15-minute safety checks continue. R: Pt was med compliant. Pt did not attend wrap-up group. Safety checks continue.

## 2017-02-26 NOTE — Progress Notes (Signed)
Recreation Therapy Notes  Date: 06.07.18 Time: 9:30 am Location: Craft Room  Group Topic: Leisure Education  Goal Area(s) Addresses:  Patient will identify things they are grateful for. Patient will identify how being grateful can influence decision making.  Behavioral Response: Did not attend  Intervention: Grateful Wheel  Activity: Patients were given an I Am Grateful For worksheet and were instructed to write things they are grateful for under each category.   Education: LRT educated patients on leisure.  Education Outcome: Patient did not attend group.   Clinical Observations/Feedback: Patient did not attend group.  Colburn Asper M, LRT/CTRS 02/26/2017 10:06 AM 

## 2017-02-26 NOTE — [facility] Group Notes (Signed)
[facility] LCSW Group Therapy  02/26/2017 2:13 PM  Type of Therapy:  Group Therapy  Participation Level:  Patient did not attend group. CSW invited patient to group.   Summary of Progress/Problems: Balance in life: Patients will discuss the concept of balance and how it looks and feels to be unbalanced. Pt will identify areas in their life that is unbalanced and ways to become more balanced. They discussed what aspects in their lives has influenced their self care. Patients also discussed self care in the areas of self regulation/control, hygiene/appearance, sleep/relaxation, healthy leisure, healthy eating habits, exercise, inner peace/spirituality, self improvement, sobriety, and health management. They were challenged to identify changes that are needed in order to improve self care.  [name] G. Garnette CzechSampson MSW, LCSWA 02/26/2017, 2:14 PM

## 2017-02-26 NOTE — Progress Notes (Signed)
Pt denies SI, HI, a/v hallucinations. Pt commits to safety for discharge. Follow up appointments, discharge medications, and treatment reviewed. Pt verbalized understanding of discharge instructions. All belongings returned to pt upon discharge.Pt discharged safely at 1415 pm picked up by group home staff. Pt was in stable condition.

## 2017-03-05 ENCOUNTER — Emergency Department
Admission: EM | Admit: 2017-03-05 | Discharge: 2017-03-05 | Disposition: A | Discharge status: Home / Self Care | Payer: Medicare Other | Attending: Emergency Medicine | Admitting: Emergency Medicine

## 2017-03-05 ENCOUNTER — Encounter: Payer: Self-pay | Admitting: Emergency Medicine

## 2017-03-05 DIAGNOSIS — Z79899 Other long term (current) drug therapy: Secondary | ICD-10-CM | POA: Diagnosis not present

## 2017-03-05 DIAGNOSIS — F17210 Nicotine dependence, cigarettes, uncomplicated: Secondary | ICD-10-CM | POA: Insufficient documentation

## 2017-03-05 DIAGNOSIS — E860 Dehydration: Secondary | ICD-10-CM | POA: Diagnosis not present

## 2017-03-05 DIAGNOSIS — I10 Essential (primary) hypertension: Secondary | ICD-10-CM | POA: Insufficient documentation

## 2017-03-05 DIAGNOSIS — R55 Syncope and collapse: Secondary | ICD-10-CM | POA: Diagnosis present

## 2017-03-05 LAB — URINALYSIS, COMPLETE (UACMP) WITH MICROSCOPIC
Bilirubin Urine: NEGATIVE
GLUCOSE, UA: NEGATIVE mg/dL
Ketones, ur: NEGATIVE mg/dL
Nitrite: NEGATIVE
PH: 5 (ref 5.0–8.0)
Protein, ur: NEGATIVE mg/dL
SQUAMOUS EPITHELIAL / LPF: NONE SEEN
Specific Gravity, Urine: 1.004 — ABNORMAL LOW (ref 1.005–1.030)

## 2017-03-05 LAB — CBC
HCT: 36.2 % — ABNORMAL LOW (ref 40.0–52.0)
Hemoglobin: 12.2 g/dL — ABNORMAL LOW (ref 13.0–18.0)
MCH: 31.2 pg (ref 26.0–34.0)
MCHC: 33.8 g/dL (ref 32.0–36.0)
MCV: 92.4 fL (ref 80.0–100.0)
PLATELETS: 338 10*3/uL (ref 150–440)
RBC: 3.92 MIL/uL — AB (ref 4.40–5.90)
RDW: 12.8 % (ref 11.5–14.5)
WBC: 7 10*3/uL (ref 3.8–10.6)

## 2017-03-05 LAB — BASIC METABOLIC PANEL
Anion gap: 8 (ref 5–15)
BUN: 21 mg/dL — AB (ref 6–20)
CALCIUM: 9.2 mg/dL (ref 8.9–10.3)
CO2: 26 mmol/L (ref 22–32)
CREATININE: 1.95 mg/dL — AB (ref 0.61–1.24)
Chloride: 99 mmol/L — ABNORMAL LOW (ref 101–111)
GFR calc non Af Amer: 36 mL/min — ABNORMAL LOW (ref >60)
GFR, EST AFRICAN AMERICAN: 42 mL/min — AB (ref >60)
GLUCOSE: 94 mg/dL (ref 65–99)
Potassium: 3.6 mmol/L (ref 3.5–5.1)
Sodium: 133 mmol/L — ABNORMAL LOW (ref 135–145)

## 2017-03-05 LAB — TROPONIN I: Troponin I: <0.03 ng/mL (ref <0.03)

## 2017-03-05 MED ORDER — SODIUM CHLORIDE 0.9 % IV BOLUS (SEPSIS)
1000.0000 mL | Freq: Once | INTRAVENOUS | Status: AC
  Administered 2017-03-05: 1000 mL via INTRAVENOUS

## 2017-03-05 MED ORDER — CEPHALEXIN 500 MG PO CAPS: 500.0000 mg | ORAL_CAPSULE | Freq: Three times a day (TID) | ORAL | 0 refills | Status: DC

## 2017-03-05 NOTE — ED Notes (Signed)
Pt states unable to give urine sample at this time.  Pt given sprite and graham crackers per pt request

## 2017-03-05 NOTE — ED Provider Notes (Signed)
[facility] Emergency Department Provider Note   ____________________________________________   I have reviewed the triage vital signs and the nursing notes.   HISTORY  Chief Complaint Loss of Consciousness   History limited by: Not Limited   HPI Roberto Vasquez is a 56 y.o. male who presents to the emergency department today after a syncopal episode. The patient states that this occurred after he was walking outside. He states it was very hot. He states he did not drink much today. He denied any concurrent chest pain or shortness of breath. Does think he is likely dehydrated. He denies any recent fevers or illness.   Past Medical History:  Diagnosis Date  . Anxiety   . Hepatitis C   . Hypertension   . Schizophrenia (HCC)   . Seizures [facility])     Patient Active Problem List   Diagnosis Date Noted  . Tobacco use disorder 02/23/2017  . Schizophrenia (HCC) 02/20/2017  . Noncompliance 02/20/2017  . HTN (hypertension) 03/01/2015    Past Surgical History:  Procedure Laterality Date  . ABDOMINAL SURGERY    . VENTRAL HERNIA REPAIR N/A 10/20/2016   Procedure: HERNIA REPAIR VENTRAL ADULT;  Surgeon: Lattie Haw, MD;  Location: [facility] ORS;  Service: General;  Laterality: N/A;    Prior to Admission medications   Medication Sig Start Date End Date Taking? Authorizing Provider  amantadine (SYMMETREL) 100 MG capsule Take 1 capsule (100 mg total) by mouth 2 (two) times daily. 02/26/17   Jimmy Footman, MD  haloperidol (HALDOL) 10 MG tablet Take 1 tablet (10 mg total) by mouth 2 (two) times daily. 02/26/17   Jimmy Footman, MD  haloperidol decanoate (HALDOL DECANOATE) 100 MG/ML injection Inject 1 mL (100 mg total) into the muscle every 30 (thirty) days. 03/20/17   Jimmy Footman, MD  traZODone (DESYREL) 100 MG tablet Take 1 tablet (100 mg total) by mouth at bedtime. 02/26/17   Jimmy Footman, MD     Allergies Patient has no known allergies.  Family History  Problem Relation Age of Onset  . Hypertension Mother     Social History Social History  Substance Use Topics  . Smoking status: Current Every Day Smoker    Packs/day: 1.00    Years: 40.00    Types: Cigarettes  . Smokeless tobacco: Never Used  . Alcohol use No    Review of Systems Constitutional: No fever/chills Eyes: No visual changes. ENT: No sore throat. Cardiovascular: Denies chest pain. Respiratory: Denies shortness of breath. Gastrointestinal: No abdominal pain.  No nausea, no vomiting.  No diarrhea.   Genitourinary: Negative for dysuria. Musculoskeletal: Negative for back pain. Skin: Negative for rash. Neurological: Negative for headaches, focal weakness or numbness.  ____________________________________________   PHYSICAL EXAM:  VITAL SIGNS: ED Triage Vitals  Enc Vitals Group     BP 03/05/17 1426 96/69     Pulse Rate 03/05/17 1426 85     Resp 03/05/17 1426 16     Temp 03/05/17 1426 98.1 F (36.7 C)     Temp Source 03/05/17 1426 Oral     SpO2 03/05/17 1426 99 %     Weight 03/05/17 1426 160 lb (72.6 kg)     Height 03/05/17 1426 6' (1.829 m)     Head Circumference --      Peak Flow --      Pain Score 03/05/17 1644 0     Pain Loc --      Pain Edu? --  Excl. in GC? --      Constitutional: Alert and oriented. Well appearing and in no distress. Eyes: Conjunctivae are normal.  ENT   Head: Normocephalic and atraumatic.   Nose: No congestion/rhinnorhea.   Mouth/Throat: Mucous membranes are moist.   Neck: No stridor. Hematological/Lymphatic/Immunilogical: No cervical lymphadenopathy. Cardiovascular: Normal rate, regular rhythm.  No murmurs, rubs, or gallops. Respiratory: Normal respiratory effort without tachypnea nor retractions. Breath sounds are clear and equal bilaterally. No wheezes/rales/rhonchi. Gastrointestinal: Soft and non tender. No rebound. No guarding.   Genitourinary: Deferred Musculoskeletal: Normal range of motion in all extremities. No lower extremity edema. Neurologic:  Normal speech and language. No gross focal neurologic deficits are appreciated.  Skin:  Skin is warm, dry and intact. No rash noted. Psychiatric: Mood and affect are normal. Speech and behavior are normal. Patient exhibits appropriate insight and judgment.  ____________________________________________    LABS (pertinent positives/negatives)  Labs Reviewed  BASIC METABOLIC PANEL - Abnormal; Notable for the following:       Result Value   Sodium 133 (*)    Chloride 99 (*)    BUN 21 (*)    Creatinine, Ser 1.95 (*)    GFR calc non Af Amer 36 (*)    GFR calc Af Amer 42 (*)    All other components within normal limits  CBC - Abnormal; Notable for the following:    RBC 3.92 (*)    Hemoglobin 12.2 (*)    HCT 36.2 (*)    All other components within normal limits  URINALYSIS, COMPLETE (UACMP) WITH MICROSCOPIC - Abnormal; Notable for the following:    Color, Urine STRAW (*)    APPearance HAZY (*)    Specific Gravity, Urine 1.004 (*)    Hgb urine dipstick MODERATE (*)    Leukocytes, UA SMALL (*)    Bacteria, UA MANY (*)    All other components within normal limits  TROPONIN I  CBG MONITORING, ED     ____________________________________________   EKG  I, Phineas SemenGraydon Karma Hiney, attending physician, personally viewed and interpreted this EKG  EKG Time: 1428 Rate: 74 Rhythm: normal sinus rhythm Axis: normal Intervals: qtc 424 QRS: narrow ST changes: no st elevation Impression: normal ekg   ____________________________________________    RADIOLOGY  None  ____________________________________________   PROCEDURES  Procedures  ____________________________________________   INITIAL IMPRESSION / ASSESSMENT AND PLAN / ED COURSE  Pertinent labs & imaging results that were available during my care of the patient were reviewed by me and considered in  my medical decision making (see chart for details).  Patient presented to the emergency department today because of concerns for dehydration and syncopal episode. Patient currently was slightly elevated consistent with dehydration. He did feel better after IV fluids. Initially urine was concerning for UTI. Will give patient prescription for Keflex.  ____________________________________________   FINAL CLINICAL IMPRESSION(S) / ED DIAGNOSES  Final diagnoses:  Dehydration     Note: This dictation was prepared with Dragon dictation. Any transcriptional errors that result from this process are unintentional     Phineas SemenGoodman, Malaijah Houchen, MD 03/05/17 [id number]

## 2017-03-05 NOTE — Discharge Instructions (Signed)
Please seek medical attention for any high fevers, chest pain, shortness of breath, change in behavior, persistent vomiting, bloody stool or any other new or concerning symptoms.  

## 2017-03-05 NOTE — ED Notes (Signed)
Pt states unable to give urine sample at this time 

## 2017-03-05 NOTE — ED Triage Notes (Signed)
Pt comes into the ED via POV c/o multiple syncopal episodes according to his group home representative.  The patient states he thinks he is here for fluids and for his BP medication.  Patient is from [facility].  Patient able to answer all questions at this time and is neurologically intact.  Group home representative is not currently present with patient during triage process.  Scarlette CalicoFrances, Group home representative, can be reached at [phone].

## 2017-03-08 LAB — URINE CULTURE

## 2017-06-10 ENCOUNTER — Telehealth: Payer: Self-pay

## 2017-06-10 ENCOUNTER — Other Ambulatory Visit: Payer: Self-pay

## 2017-06-10 DIAGNOSIS — B182 Chronic viral hepatitis C: Secondary | ICD-10-CM

## 2017-06-10 NOTE — Telephone Encounter (Signed)
-----   Message from Rayann Heman, CMA sent at 12/01/2016 10:40 AM EDT ----- Pt needs 6 months Hep C labs.

## 2017-06-10 NOTE — Telephone Encounter (Signed)
Spoke with pt's caretaker and advised her pt is due for his 6 month repeat viral load and LFT's. She said she will take him next week to [facility] lab.

## 2018-03-31 ENCOUNTER — Other Ambulatory Visit: Payer: Self-pay

## 2018-03-31 ENCOUNTER — Emergency Department
Admission: EM | Admit: 2018-03-31 | Discharge: 2018-03-31 | Disposition: A | Discharge status: Home / Self Care | Payer: Medicare Other | Attending: Emergency Medicine | Admitting: Emergency Medicine

## 2018-03-31 DIAGNOSIS — Z79899 Other long term (current) drug therapy: Secondary | ICD-10-CM | POA: Diagnosis not present

## 2018-03-31 DIAGNOSIS — N3090 Cystitis, unspecified without hematuria: Secondary | ICD-10-CM | POA: Insufficient documentation

## 2018-03-31 DIAGNOSIS — R638 Other symptoms and signs concerning food and fluid intake: Secondary | ICD-10-CM | POA: Diagnosis present

## 2018-03-31 DIAGNOSIS — I10 Essential (primary) hypertension: Secondary | ICD-10-CM | POA: Diagnosis not present

## 2018-03-31 DIAGNOSIS — F17210 Nicotine dependence, cigarettes, uncomplicated: Secondary | ICD-10-CM | POA: Insufficient documentation

## 2018-03-31 LAB — URINALYSIS, COMPLETE (UACMP) WITH MICROSCOPIC
Bilirubin Urine: NEGATIVE
Glucose, UA: NEGATIVE mg/dL
KETONES UR: NEGATIVE mg/dL
Nitrite: POSITIVE — AB
PROTEIN: NEGATIVE mg/dL
Specific Gravity, Urine: 1.013 (ref 1.005–1.030)
pH: 6 (ref 5.0–8.0)

## 2018-03-31 MED ORDER — CEPHALEXIN 500 MG PO CAPS: 500.0000 mg | ORAL_CAPSULE | Freq: Two times a day (BID) | ORAL | 0 refills | Status: DC

## 2018-03-31 MED ORDER — CEPHALEXIN 500 MG PO CAPS
500.0000 mg | ORAL_CAPSULE | Freq: Once | ORAL | Status: AC
  Administered 2018-03-31: 500 mg via ORAL
  Filled 2018-03-31: qty 1

## 2018-03-31 NOTE — ED Notes (Signed)
Pt provided with ginger ale and sandwich and banana. Pt eating currently.

## 2018-03-31 NOTE — Discharge Instructions (Addendum)
Results for orders placed or performed during the hospital encounter of 03/31/18  Urinalysis, Complete w Microscopic  Result Value Ref Range   Color, Urine YELLOW (A) YELLOW   APPearance CLOUDY (A) CLEAR   Specific Gravity, Urine 1.013 1.005 - 1.030   pH 6.0 5.0 - 8.0   Glucose, UA NEGATIVE NEGATIVE mg/dL   Hgb urine dipstick SMALL (A) NEGATIVE   Bilirubin Urine NEGATIVE NEGATIVE   Ketones, ur NEGATIVE NEGATIVE mg/dL   Protein, ur NEGATIVE NEGATIVE mg/dL   Nitrite POSITIVE (A) NEGATIVE   Leukocytes, UA MODERATE (A) NEGATIVE   RBC / HPF 0-5 0 - 5 RBC/hpf   WBC, UA 21-50 0 - 5 WBC/hpf   Bacteria, UA MANY (A) NONE SEEN   Squamous Epithelial / LPF 6-10 0 - 5   WBC Clumps PRESENT    Mucus PRESENT    No results found.

## 2018-03-31 NOTE — ED Notes (Signed)
Report given to [name] at [address] [facility].

## 2018-03-31 NOTE — ED Notes (Signed)
Pt alert and oriented to baseline at time of departure with group home staff. Pt ambulatory with steady gait. Respirations equal and even. Discharge instructions including prescription given to group home staff.

## 2018-03-31 NOTE — ED Triage Notes (Signed)
Pt from assisted living facility. Staff there states that every year around this time that the pt has poor intake and does not eat or drink much. VSS. AO to baseline per medic.

## 2018-03-31 NOTE — ED Notes (Signed)
Katie, RN buddy for in and out cath.

## 2018-03-31 NOTE — ED Notes (Signed)
Pt unable to provide urine sample at this time. Urinal at bedside. Encouraged pt to attempt again. Will continue to monitor.

## 2018-03-31 NOTE — ED Notes (Signed)
MD unaware unable to collect urine sample at this time.

## 2018-03-31 NOTE — ED Notes (Signed)
Per Dr. Scotty CourtStafford in and out cath pt to obtain urine sample.

## 2018-03-31 NOTE — ED Provider Notes (Signed)
[facility] Emergency Department Provider Note  ____________________________________________  Time seen: Approximately 8:55 AM  I have reviewed the triage vital signs and the nursing notes.   HISTORY  Chief Complaint Failure To Thrive  Level 5 Caveat: Portions of the History and Physical including HPI and review of systems are unable to be completely obtained due to patient being a poor historian   HPI Roberto Vasquez is a 57 y.o. male sent to the ED for evaluation due to reported poor oral intake from his assisted living facility.  No reported specific symptoms.  Patient denies any symptoms.  He feels normal.  Denies nausea vomiting or pain.  EMS report being told that the patient has an episode like this about every year at this time.  Review of electronic medical record shows that a year ago he had a urinary tract infection.      Past Medical History:  Diagnosis Date  . Anxiety   . Hepatitis C   . Hypertension   . Schizophrenia (HCC)   . Seizures [facility](HCC)      Patient Active Problem List   Diagnosis Date Noted  . Tobacco use disorder 02/23/2017  . Schizophrenia (HCC) 02/20/2017  . Noncompliance 02/20/2017  . HTN (hypertension) 03/01/2015     Past Surgical History:  Procedure Laterality Date  . ABDOMINAL SURGERY    . VENTRAL HERNIA REPAIR N/A 10/20/2016   Procedure: HERNIA REPAIR VENTRAL ADULT;  Surgeon: Lattie Hawichard E Cooper, MD;  Location: [facility] ORS;  Service: General;  Laterality: N/A;     Prior to Admission medications   Medication Sig Start Date End Date Taking? Authorizing Provider  amantadine (SYMMETREL) 100 MG capsule Take 1 capsule (100 mg total) by mouth 2 (two) times daily. 02/26/17   Jimmy FootmanHernandez-Gonzalez, Andrea, MD  cephALEXin (KEFLEX) 500 MG capsule Take 1 capsule (500 mg total) by mouth 2 (two) times daily. 03/31/18   Sharman CheekStafford, Branden Shallenberger, MD  haloperidol (HALDOL) 10 MG tablet Take 1 tablet (10 mg total) by mouth 2 (two) times daily.  02/26/17   Jimmy FootmanHernandez-Gonzalez, Andrea, MD  haloperidol decanoate (HALDOL DECANOATE) 100 MG/ML injection Inject 1 mL (100 mg total) into the muscle every 30 (thirty) days. 03/20/17   Jimmy FootmanHernandez-Gonzalez, Andrea, MD  traZODone (DESYREL) 100 MG tablet Take 1 tablet (100 mg total) by mouth at bedtime. 02/26/17   Jimmy FootmanHernandez-Gonzalez, Andrea, MD     Allergies Patient has no known allergies.   Family History  Problem Relation Age of Onset  . Hypertension Mother     Social History Social History   Tobacco Use  . Smoking status: Current Every Day Smoker    Packs/day: 1.00    Years: 40.00    Pack years: 40.00    Types: Cigarettes  . Smokeless tobacco: Never Used  Substance Use Topics  . Alcohol use: No  . Drug use: No    Review of Systems  Constitutional:   No fever or chills.  ENT:   No sore throat. No rhinorrhea. Cardiovascular:   No chest pain or syncope. Respiratory:   No dyspnea or cough. Gastrointestinal:   Negative for abdominal pain, vomiting and diarrhea.  Musculoskeletal:   Negative for focal pain or swelling All other systems reviewed and are negative except as documented above in ROS and HPI.  ____________________________________________   PHYSICAL EXAM:  VITAL SIGNS: ED Triage Vitals [03/31/18 0845]  Enc Vitals Group     BP (!) 119/96     Pulse Rate 74     Resp  Temp 98 F (36.7 C)     Temp Source Oral     SpO2 99 %     Weight 163 lb 12.8 oz (74.3 kg)     Height 5\' 10"  (1.778 m)     Head Circumference      Peak Flow      Pain Score      Pain Loc      Pain Edu?      Excl. in GC?     Vital signs reviewed, nursing assessments reviewed.   Constitutional:   Alert and oriented. Non-toxic appearance.  Malodorous urine on clothes Eyes:   Conjunctivae are normal. EOMI. PERRL. ENT      Head:   Normocephalic and atraumatic.      Nose:   No congestion/rhinnorhea.       Mouth/Throat:   MMM, no pharyngeal erythema. No peritonsillar mass.       Neck:   No  meningismus. Full ROM. Hematological/Lymphatic/Immunilogical:   No cervical lymphadenopathy. Cardiovascular:   RRR. Symmetric bilateral radial and DP pulses.  No murmurs.  Respiratory:   Normal respiratory effort without tachypnea/retractions. Breath sounds are clear and equal bilaterally. No wheezes/rales/rhonchi. Gastrointestinal:   Soft and nontender. Non distended. There is no CVA tenderness.  No rebound, rigidity, or guarding. Musculoskeletal:   Normal range of motion in all extremities. No joint effusions.  No lower extremity tenderness.  No edema. Neurologic:   Normal speech and language.  Motor grossly intact. No acute focal neurologic deficits are appreciated.  Skin:    Skin is warm, dry and intact. No rash noted.  No petechiae, purpura, or bullae.  ____________________________________________    LABS (pertinent positives/negatives) (all labs ordered are listed, but only abnormal results are displayed) Labs Reviewed  URINALYSIS, COMPLETE (UACMP) WITH MICROSCOPIC - Abnormal; Notable for the following components:      Result Value   Color, Urine YELLOW (*)    APPearance CLOUDY (*)    Hgb urine dipstick SMALL (*)    Nitrite POSITIVE (*)    Leukocytes, UA MODERATE (*)    Bacteria, UA MANY (*)    All other components within normal limits  URINE CULTURE   ____________________________________________   EKG    ____________________________________________    RADIOLOGY  No results found.  ____________________________________________   PROCEDURES Procedures  ____________________________________________    CLINICAL IMPRESSION / ASSESSMENT AND PLAN / ED COURSE  Pertinent labs & imaging results that were available during my care of the patient were reviewed by me and considered in my medical decision making (see chart for details).    Patient is well-appearing and nontoxic.  Vital signs are normal.  Sent to the ED due to poor oral intake, the patient was given a  lunch box and is vigorously eating a sandwich.  He appears to be at baseline without specific acute symptoms.  Possibly he has a urinary tract infection, and I will check a urinalysis before discharge home.  No evidence of acute intra-abdominal process, ACS PE dissection AAA pneumonia pneumothorax soft tissue infection or trauma.  Clinical Course as of Mar 31 1501  Wed Mar 31, 2018  1501 UA consistent with urinary tract infection.  Urine culture sent.  I will discharge the patient on Keflex.  WBC, UA: 21-50 [PS]    Clinical Course User Index [PS] Sharman Cheek, MD     ____________________________________________   FINAL CLINICAL IMPRESSION(S) / ED DIAGNOSES    Final diagnoses:  Cystitis     ED Discharge  Orders        Ordered    cephALEXin (KEFLEX) 500 MG capsule  2 times daily     03/31/18 1502      Portions of this note were generated with dragon dictation software. Dictation errors may occur despite best attempts at proofreading.    Sharman Cheek, MD 03/31/18 [phone]

## 2018-04-02 LAB — URINE CULTURE: Culture: >=100000 — AB

## 2018-04-06 ENCOUNTER — Inpatient Hospital Stay
Admission: EM | Admit: 2018-04-06 | Discharge: 2018-04-09 | DRG: 071 | Disposition: A | Discharge status: Home / Self Care | Payer: Medicare Other | Attending: Internal Medicine | Admitting: Internal Medicine

## 2018-04-06 ENCOUNTER — Emergency Department: Payer: Medicare Other

## 2018-04-06 ENCOUNTER — Inpatient Hospital Stay: Payer: Medicare Other

## 2018-04-06 ENCOUNTER — Other Ambulatory Visit: Payer: Self-pay

## 2018-04-06 DIAGNOSIS — F17210 Nicotine dependence, cigarettes, uncomplicated: Secondary | ICD-10-CM | POA: Diagnosis present

## 2018-04-06 DIAGNOSIS — R4182 Altered mental status, unspecified: Secondary | ICD-10-CM

## 2018-04-06 DIAGNOSIS — Z79899 Other long term (current) drug therapy: Secondary | ICD-10-CM | POA: Diagnosis not present

## 2018-04-06 DIAGNOSIS — F203 Undifferentiated schizophrenia: Secondary | ICD-10-CM | POA: Diagnosis not present

## 2018-04-06 DIAGNOSIS — E872 Acidosis: Secondary | ICD-10-CM | POA: Diagnosis present

## 2018-04-06 DIAGNOSIS — F209 Schizophrenia, unspecified: Secondary | ICD-10-CM | POA: Diagnosis present

## 2018-04-06 DIAGNOSIS — I129 Hypertensive chronic kidney disease with stage 1 through stage 4 chronic kidney disease, or unspecified chronic kidney disease: Secondary | ICD-10-CM | POA: Diagnosis present

## 2018-04-06 DIAGNOSIS — Z9119 Patient's noncompliance with other medical treatment and regimen: Secondary | ICD-10-CM | POA: Diagnosis not present

## 2018-04-06 DIAGNOSIS — E860 Dehydration: Secondary | ICD-10-CM | POA: Diagnosis present

## 2018-04-06 DIAGNOSIS — R569 Unspecified convulsions: Secondary | ICD-10-CM

## 2018-04-06 DIAGNOSIS — G40909 Epilepsy, unspecified, not intractable, without status epilepticus: Secondary | ICD-10-CM | POA: Diagnosis present

## 2018-04-06 DIAGNOSIS — A419 Sepsis, unspecified organism: Secondary | ICD-10-CM

## 2018-04-06 DIAGNOSIS — B1920 Unspecified viral hepatitis C without hepatic coma: Secondary | ICD-10-CM | POA: Diagnosis present

## 2018-04-06 DIAGNOSIS — E876 Hypokalemia: Secondary | ICD-10-CM | POA: Diagnosis present

## 2018-04-06 DIAGNOSIS — G9341 Metabolic encephalopathy: Principal | ICD-10-CM | POA: Diagnosis present

## 2018-04-06 DIAGNOSIS — N179 Acute kidney failure, unspecified: Secondary | ICD-10-CM | POA: Diagnosis present

## 2018-04-06 DIAGNOSIS — N183 Chronic kidney disease, stage 3 (moderate): Secondary | ICD-10-CM | POA: Diagnosis present

## 2018-04-06 LAB — CBC
HCT: 43.4 % (ref 40.0–52.0)
HEMOGLOBIN: 14.7 g/dL (ref 13.0–18.0)
MCH: 33.3 pg (ref 26.0–34.0)
MCHC: 33.9 g/dL (ref 32.0–36.0)
MCV: 98.3 fL (ref 80.0–100.0)
Platelets: 223 10*3/uL (ref 150–440)
RBC: 4.42 MIL/uL (ref 4.40–5.90)
RDW: 11.9 % (ref 11.5–14.5)
WBC: 9.3 10*3/uL (ref 3.8–10.6)

## 2018-04-06 LAB — BLOOD CULTURE ID PANEL (REFLEXED)
Acinetobacter baumannii: NOT DETECTED
CANDIDA KRUSEI: NOT DETECTED
Candida albicans: NOT DETECTED
Candida glabrata: NOT DETECTED
Candida parapsilosis: NOT DETECTED
Candida tropicalis: NOT DETECTED
ENTEROCOCCUS SPECIES: NOT DETECTED
ESCHERICHIA COLI: NOT DETECTED
Enterobacter cloacae complex: NOT DETECTED
Enterobacteriaceae species: NOT DETECTED
Haemophilus influenzae: NOT DETECTED
Klebsiella oxytoca: NOT DETECTED
Klebsiella pneumoniae: NOT DETECTED
LISTERIA MONOCYTOGENES: NOT DETECTED
METHICILLIN RESISTANCE: DETECTED — AB
NEISSERIA MENINGITIDIS: NOT DETECTED
Proteus species: NOT DETECTED
Pseudomonas aeruginosa: NOT DETECTED
SERRATIA MARCESCENS: NOT DETECTED
STAPHYLOCOCCUS AUREUS BCID: NOT DETECTED
STAPHYLOCOCCUS SPECIES: DETECTED — AB
STREPTOCOCCUS AGALACTIAE: NOT DETECTED
STREPTOCOCCUS PNEUMONIAE: NOT DETECTED
STREPTOCOCCUS SPECIES: NOT DETECTED
Streptococcus pyogenes: NOT DETECTED

## 2018-04-06 LAB — URINE DRUG SCREEN, QUALITATIVE (ARMC ONLY)
AMPHETAMINES, UR SCREEN: NOT DETECTED
Benzodiazepine, Ur Scrn: NOT DETECTED
CANNABINOID 50 NG, UR ~~LOC~~: NOT DETECTED
Cocaine Metabolite,Ur ~~LOC~~: NOT DETECTED
MDMA (ECSTASY) UR SCREEN: NOT DETECTED
Methadone Scn, Ur: NOT DETECTED
Opiate, Ur Screen: NOT DETECTED
Phencyclidine (PCP) Ur S: NOT DETECTED
TRICYCLIC, UR SCREEN: NOT DETECTED

## 2018-04-06 LAB — URINALYSIS, COMPLETE (UACMP) WITH MICROSCOPIC
BACTERIA UA: NONE SEEN
BILIRUBIN URINE: NEGATIVE
Glucose, UA: NEGATIVE mg/dL
KETONES UR: NEGATIVE mg/dL
Leukocytes, UA: NEGATIVE
NITRITE: NEGATIVE
Protein, ur: 100 mg/dL — AB
SPECIFIC GRAVITY, URINE: 1.014 (ref 1.005–1.030)
pH: 5 (ref 5.0–8.0)

## 2018-04-06 LAB — COMPREHENSIVE METABOLIC PANEL
ALBUMIN: 4.4 g/dL (ref 3.5–5.0)
ALT: 22 U/L (ref 0–44)
ANION GAP: 25 — AB (ref 5–15)
AST: 60 U/L — AB (ref 15–41)
Alkaline Phosphatase: 61 U/L (ref 38–126)
BUN: 21 mg/dL — AB (ref 6–20)
CO2: 13 mmol/L — AB (ref 22–32)
Calcium: 9.3 mg/dL (ref 8.9–10.3)
Chloride: 105 mmol/L (ref 98–111)
Creatinine, Ser: 1.79 mg/dL — ABNORMAL HIGH (ref 0.61–1.24)
GFR calc Af Amer: 46 mL/min — ABNORMAL LOW (ref >60)
GFR calc non Af Amer: 40 mL/min — ABNORMAL LOW (ref >60)
GLUCOSE: 180 mg/dL — AB (ref 70–99)
Potassium: 3.5 mmol/L (ref 3.5–5.1)
SODIUM: 143 mmol/L (ref 135–145)
Total Bilirubin: 1.4 mg/dL — ABNORMAL HIGH (ref 0.3–1.2)
Total Protein: 8.3 g/dL — ABNORMAL HIGH (ref 6.5–8.1)

## 2018-04-06 LAB — TROPONIN I: Troponin I: <0.03 ng/mL (ref <0.03)

## 2018-04-06 LAB — LACTIC ACID, PLASMA
Lactic Acid, Venous: 10.7 mmol/L (ref 0.5–1.9)
Lactic Acid, Venous: 1.1 mmol/L (ref 0.5–1.9)

## 2018-04-06 LAB — CK: CK TOTAL: 532 U/L — AB (ref 49–397)

## 2018-04-06 LAB — MRSA PCR SCREENING: MRSA BY PCR: NEGATIVE

## 2018-04-06 LAB — GLUCOSE, CAPILLARY: GLUCOSE-CAPILLARY: 157 mg/dL — AB (ref 70–99)

## 2018-04-06 MED ORDER — SODIUM CHLORIDE 0.9 % IV BOLUS
1000.0000 mL | Freq: Once | INTRAVENOUS | Status: DC
  Administered 2018-04-06: 1000 mL via INTRAVENOUS

## 2018-04-06 MED ORDER — VANCOMYCIN HCL IN DEXTROSE 1-5 GM/200ML-% IV SOLN
1000.0000 mg | Freq: Once | INTRAVENOUS | Status: AC
  Administered 2018-04-06: 1000 mg via INTRAVENOUS
  Filled 2018-04-06: qty 200

## 2018-04-06 MED ORDER — ACETAMINOPHEN 650 MG RE SUPP: 650.0000 mg | Freq: Four times a day (QID) | RECTAL | Status: DC | PRN

## 2018-04-06 MED ORDER — HALOPERIDOL 2 MG PO TABS
10.0000 mg | ORAL_TABLET | Freq: Two times a day (BID) | ORAL | Status: DC
  Administered 2018-04-07 – 2018-04-09 (×5): 10 mg via ORAL
  Filled 2018-04-06 (×3): qty 5
  Filled 2018-04-06: qty 2
  Filled 2018-04-06: qty 5
  Filled 2018-04-06: qty 2
  Filled 2018-04-06 (×2): qty 5

## 2018-04-06 MED ORDER — PIPERACILLIN-TAZOBACTAM 3.375 G IVPB 30 MIN
3.3750 g | Freq: Once | INTRAVENOUS | Status: AC
  Administered 2018-04-06: 3.375 g via INTRAVENOUS
  Filled 2018-04-06: qty 50

## 2018-04-06 MED ORDER — SODIUM CHLORIDE 0.9 % IV BOLUS
1000.0000 mL | Freq: Once | INTRAVENOUS | Status: AC
  Administered 2018-04-06: 1000 mL via INTRAVENOUS

## 2018-04-06 MED ORDER — VANCOMYCIN HCL IN DEXTROSE 1-5 GM/200ML-% IV SOLN
1000.0000 mg | INTRAVENOUS | Status: DC
  Administered 2018-04-06: 14:00:00 1000 mg via INTRAVENOUS
  Filled 2018-04-06 (×2): qty 200

## 2018-04-06 MED ORDER — HALOPERIDOL DECANOATE 100 MG/ML IM SOLN: 100.0000 mg | INTRAMUSCULAR | Status: DC

## 2018-04-06 MED ORDER — PIPERACILLIN-TAZOBACTAM 3.375 G IVPB
3.3750 g | Freq: Three times a day (TID) | INTRAVENOUS | Status: DC
  Administered 2018-04-06 – 2018-04-08 (×6): 3.375 g via INTRAVENOUS
  Filled 2018-04-06 (×6): qty 50

## 2018-04-06 MED ORDER — AMANTADINE HCL 100 MG PO CAPS
100.0000 mg | ORAL_CAPSULE | Freq: Two times a day (BID) | ORAL | Status: DC
  Administered 2018-04-07 – 2018-04-09 (×5): 100 mg via ORAL
  Filled 2018-04-06 (×8): qty 1

## 2018-04-06 MED ORDER — LEVETIRACETAM 500 MG PO TABS
500.0000 mg | ORAL_TABLET | Freq: Two times a day (BID) | ORAL | Status: DC
  Administered 2018-04-07: 500 mg via ORAL
  Filled 2018-04-06 (×2): qty 1

## 2018-04-06 MED ORDER — SODIUM BICARBONATE 8.4 % IV SOLN
INTRAVENOUS | Status: AC
  Administered 2018-04-06 (×2): via INTRAVENOUS
  Filled 2018-04-06 (×2): qty 150

## 2018-04-06 MED ORDER — LORAZEPAM 2 MG/ML IJ SOLN
INTRAMUSCULAR | Status: AC
  Administered 2018-04-06: 16:00:00 2 mg via INTRAVENOUS
  Filled 2018-04-06: qty 1

## 2018-04-06 MED ORDER — ONDANSETRON HCL 4 MG/2ML IJ SOLN: 4.0000 mg | Freq: Four times a day (QID) | INTRAMUSCULAR | Status: DC | PRN

## 2018-04-06 MED ORDER — ACETAMINOPHEN 325 MG PO TABS: 650.0000 mg | ORAL_TABLET | Freq: Four times a day (QID) | ORAL | Status: DC | PRN

## 2018-04-06 MED ORDER — TRAZODONE HCL 50 MG PO TABS
100.0000 mg | ORAL_TABLET | Freq: Every day | ORAL | Status: DC
  Administered 2018-04-07 – 2018-04-08 (×2): 100 mg via ORAL
  Filled 2018-04-06 (×2): qty 2

## 2018-04-06 MED ORDER — SODIUM CHLORIDE 0.9 % IV BOLUS: 1000.0000 mL | Freq: Once | INTRAVENOUS | Status: DC

## 2018-04-06 MED ORDER — LEVETIRACETAM IN NACL 1500 MG/100ML IV SOLN
1500.0000 mg | Freq: Once | INTRAVENOUS | Status: AC
  Administered 2018-04-06: 1500 mg via INTRAVENOUS
  Filled 2018-04-06: qty 100

## 2018-04-06 MED ORDER — ONDANSETRON HCL 4 MG PO TABS: 4.0000 mg | ORAL_TABLET | Freq: Four times a day (QID) | ORAL | Status: DC | PRN

## 2018-04-06 MED ORDER — ENOXAPARIN SODIUM 40 MG/0.4ML ~~LOC~~ SOLN
40.0000 mg | SUBCUTANEOUS | Status: DC
  Administered 2018-04-06 – 2018-04-08 (×3): 40 mg via SUBCUTANEOUS
  Filled 2018-04-06 (×3): qty 0.4

## 2018-04-06 MED ORDER — LORAZEPAM 2 MG/ML IJ SOLN
2.0000 mg | INTRAMUSCULAR | Status: DC | PRN
  Administered 2018-04-06: 2 mg via INTRAVENOUS

## 2018-04-06 NOTE — ED Notes (Signed)
Date and time results received: 04/06/18 0623 (use smartphrase ".now" to insert current time)  Test: Lactic Acid Critical Value: 10.7  Name of Provider Notified: Dr. Manson PasseyBrown  Orders Received? Or Actions Taken?:

## 2018-04-06 NOTE — H&P (Signed)
[employer] [facility] [place] [facility]   PATIENT NAME: Roberto Vasquez    MR#:  [id number]  DATE OF BIRTH:  [birth year]  DATE OF ADMISSION:  04/06/2018  PRIMARY CARE PHYSICIAN: Sherrie MustacheJadali, Fayegh, MD (Inactive)   REQUESTING/REFERRING PHYSICIAN: Dr. Bayard Malesandolph Brown  CHIEF COMPLAINT:   Chief Complaint  Patient presents with  . Loss of Consciousness    HISTORY OF PRESENT ILLNESS:  Roberto AlmasJerome Corwin  is a 57 y.o. male with a known history of schizophrenia, hypertension not on any medications, seizure disorder who is from a group home is brought in currently from a presents secondary to altered mental status. Patient is alert but unable to provide any history at this time.  Very disoriented and confused.  Most of the history is obtained from looking at his old records.  Patient has had multiple psychiatric hospitalizations for his schizophrenia and aggressive behavior, most recent one was here in June 2018.  He was in the emergency room from a group home last week for UTI and was discharged on Keflex.  The very next day on 04/01/2018 he was incarcerated for unknown reasons.  Not sure if he continued his antibiotic while in the present or not.  There was an episode where he had to be moved to a different cell due to fecal incontinence.  No seizure episode was reported at the time.  Last night he was noted to be unresponsive in the cell and is brought to the hospital.  No tongue bite noted.  No urinary incontinence reported.  Seizure disorder has been mentioned in his past medical history, but never been on any medications based on his previous medication list.  No history of alcohol or drugs reported.  CT of the head is negative.  Labs indicate acute renal failure with lactic acidosis and he is being admitted for the same.  PAST MEDICAL HISTORY:   Past Medical History:  Diagnosis Date  . Anxiety   . Hepatitis C   . Hypertension   . Schizophrenia (HCC)   . Seizures (HCC)     PAST  SURGICAL HISTORY:   Past Surgical History:  Procedure Laterality Date  . ABDOMINAL SURGERY    . VENTRAL HERNIA REPAIR N/A 10/20/2016   Procedure: HERNIA REPAIR VENTRAL ADULT;  Surgeon: Lattie Hawichard E Cooper, MD;  Location: [facility] ORS;  Service: General;  Laterality: N/A;    SOCIAL HISTORY:   Social History   Tobacco Use  . Smoking status: Current Every Day Smoker    Packs/day: 1.00    Years: 40.00    Pack years: 40.00    Types: Cigarettes  . Smokeless tobacco: Never Used  Substance Use Topics  . Alcohol use: No    FAMILY HISTORY:   Family History  Problem Relation Age of Onset  . Hypertension Mother     DRUG ALLERGIES:  No Known Allergies  REVIEW OF SYSTEMS:   Review of Systems  Unable to perform ROS: Mental status change    MEDICATIONS AT HOME:   Prior to Admission medications   Medication Sig Start Date End Date Taking? Authorizing Provider  amantadine (SYMMETREL) 100 MG capsule Take 1 capsule (100 mg total) by mouth 2 (two) times daily. 02/26/17   Jimmy FootmanHernandez-Gonzalez, Andrea, MD  cephALEXin (KEFLEX) 500 MG capsule Take 1 capsule (500 mg total) by mouth 2 (two) times daily. 03/31/18   Sharman CheekStafford, Phillip, MD  haloperidol (HALDOL) 10 MG tablet Take 1 tablet (10 mg total) by mouth 2 (two) times daily. 02/26/17  Jimmy Footman, MD  haloperidol decanoate (HALDOL DECANOATE) 100 MG/ML injection Inject 1 mL (100 mg total) into the muscle every 30 (thirty) days. 03/20/17   Jimmy Footman, MD  traZODone (DESYREL) 100 MG tablet Take 1 tablet (100 mg total) by mouth at bedtime. 02/26/17   Jimmy Footman, MD      VITAL SIGNS:  Blood pressure (!) 129/97, pulse (!) 125, temperature 97.8 F (36.6 C), temperature source Axillary, resp. rate 19, height 6' (1.829 m), weight 86.2 kg (190 lb), SpO2 98 %.  PHYSICAL EXAMINATION:   Physical Exam  GENERAL:  57 y.o.-year-old patient lying in the bed, confused but alert. Not in acute distress EYES: Pupils equal,  round, reactive to light and accommodation. No scleral icterus. Extraocular muscles intact.  HEENT: Head atraumatic, normocephalic. Oropharynx and nasopharynx clear.  NECK:  Supple, no jugular venous distention. No thyroid enlargement, no tenderness.  LUNGS: Normal breath sounds bilaterally, no wheezing, rales,rhonchi or crepitation. No use of accessory muscles of respiration.  CARDIOVASCULAR: S1, S2 normal. No murmurs, rubs, or gallops.  ABDOMEN: Soft, nontender, nondistended. Bowel sounds present. No organomegaly or mass.  EXTREMITIES: No pedal edema, cyanosis, or clubbing.  NEUROLOGIC: alert, confused, following simple commands, able to move all extremities in bed.  PSYCHIATRIC: The patient is alert and disoriented.  SKIN: No obvious rash, lesion, or ulcer.   LABORATORY PANEL:   CBC Recent Labs  Lab 04/06/18 0509  WBC 9.3  HGB 14.7  HCT 43.4  PLT 223   ------------------------------------------------------------------------------------------------------------------  Chemistries  Recent Labs  Lab 04/06/18 0509  NA 143  K 3.5  CL 105  CO2 13*  GLUCOSE 180*  BUN 21*  CREATININE 1.79*  CALCIUM 9.3  AST 60*  ALT 22  ALKPHOS 61  BILITOT 1.4*   ------------------------------------------------------------------------------------------------------------------  Cardiac Enzymes Recent Labs  Lab 04/06/18 0509  TROPONINI <0.03   ------------------------------------------------------------------------------------------------------------------  RADIOLOGY:  Ct Head Wo Contrast  Result Date: 04/06/2018 CLINICAL DATA:  57 year old male with altered mental status. EXAM: CT HEAD WITHOUT CONTRAST TECHNIQUE: Contiguous axial images were obtained from the base of the skull through the vertex without intravenous contrast. COMPARISON:  Head CT dated 12/19/2015 FINDINGS: Brain: The ventricles and sulci appropriate size for patient's age. Minimal periventricular and deep white matter  chronic microvascular ischemic changes. There is no acute intracranial hemorrhage. No mass effect or midline shift. No extra-axial fluid collection. Vascular: No hyperdense vessel or unexpected calcification. Skull: Normal. Negative for fracture or focal lesion. Sinuses/Orbits: No acute finding. Other: None IMPRESSION: No acute intracranial pathology. Electronically Signed   By: Elgie Collard M.D.   On: 04/06/2018 05:51   Dg Chest Port 1 View  Result Date: 04/06/2018 CLINICAL DATA:  57 year old male with tachypnea EXAM: PORTABLE CHEST 1 VIEW COMPARISON:  Chest radiograph dated 03/01/2015 FINDINGS: The heart size and mediastinal contours are within normal limits. Both lungs are clear. The visualized skeletal structures are unremarkable. IMPRESSION: No active disease. Electronically Signed   By: Elgie Collard M.D.   On: 04/06/2018 06:14    EKG:   Orders placed or performed during the hospital encounter of 04/06/18  . ED EKG  . ED EKG  . EKG 12-Lead  . EKG 12-Lead  . EKG 12-Lead  . EKG 12-Lead  . ED EKG 12-Lead  . ED EKG 12-Lead    IMPRESSION AND PLAN:   Kenyon Eichelberger  is a 57 y.o. male with a known history of schizophrenia, hypertension not on any medications, seizure disorder who is from a group  home is brought in currently from a presents secondary to altered mental status.  1.  Altered mental status-could be postictal from seizure versus metabolic encephalopathy -IV fluids.  CT of the head is negative for any acute findings -Did not get any antiepileptics.  Will get neurology consult -Neurochecks, seizure precautions -N.p.o. until mental status is improved.  EEG has been ordered  2.  Sepsis-not sure if the lactic acid elevation is from the seizure or underlying sepsis -Monitor lactic acid. -Blood cultures and urine cultures have been ordered -Repeat UA ordered. -Currently on vancomycin and Zosyn.  Narrow antibiotics as soon as culture results are available.  3.  Metabolic  acidosis-secondary to lactic acidosis, bicarb is low -Started on sodium bicarb infusion.  Monitor labs. -Also mild renal insufficiency on top of CKD stage III.  4.  Schizophrenia-continue Haldol twice daily and monthly injection.  Also on amantadine for extraparametal symptoms  5.  DVT prophylaxis-Lovenox  Physical therapy consult once more mentally appropriate   All the records are reviewed and case discussed with ED provider. Management plans discussed with the patient, family and they are in agreement.  CODE STATUS: Full Code  TOTAL TIME TAKING CARE OF THIS PATIENT: 55 minutes.    Enid Baas M.D on 04/06/2018 at 8:03 AM  Between 7am to 6pm - Pager - [phone]  After 6pm go to [email or web address] - password [employer]  [facility] [place] [facility]  Office  [phone]  CC: Primary care physician; Sherrie Mustache, MD (Inactive)

## 2018-04-06 NOTE — ED Notes (Signed)
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

## 2018-04-06 NOTE — NC FL2 (Signed)
[place] MEDICAID FL2 LEVEL OF CARE SCREENING TOOL     IDENTIFICATION  Patient Name: Roberto Vasquez Birthdate: 1961-06-25 Sex: male Admission Date (Current Location): 04/06/2018  [place] Number:  Chiropodist and Address:  [facility], [address], [place], [place] [address]      Provider Number: [id number]  Attending Physician Name and Address:  Enid Baas, MD  Relative Name and Phone Number:       Current Level of Care: Hospital Recommended Level of Care: Other (Comment)(Group Home ) Prior Approval Number:    Date Approved/Denied:   PASRR Number:    Discharge Plan: Other (Comment)(Group Home )    Current Diagnoses: Patient Active Problem List   Diagnosis Date Noted  . Seizure (HCC) 04/06/2018  . Tobacco use disorder 02/23/2017  . Schizophrenia (HCC) 02/20/2017  . Noncompliance 02/20/2017  . HTN (hypertension) 03/01/2015    Orientation RESPIRATION BLADDER Height & Weight     Self, Place, Time  Normal Incontinent Weight: 164 lb 6.4 oz (74.6 kg) Height:  6' (182.9 cm)  BEHAVIORAL SYMPTOMS/MOOD NEUROLOGICAL BOWEL NUTRITION STATUS  (none ) (None) Incontinent Diet(NPO to be advanced )  AMBULATORY STATUS COMMUNICATION OF NEEDS Skin   Limited Assist Verbally Normal                       Personal Care Assistance Level of Assistance  Bathing, Feeding, Dressing Bathing Assistance: Limited assistance Feeding assistance: Independent Dressing Assistance: Independent     Functional Limitations Info  Sight, Hearing, Speech Sight Info: Adequate Hearing Info: Adequate Speech Info: Adequate    SPECIAL CARE FACTORS FREQUENCY                       Contractures Contractures Info: Not present    Additional Factors Info  Code Status, Allergies Code Status Info: Full Code  Allergies Info: NKA           Current Medications (04/06/2018):  This is the current hospital active medication  list Current Facility-Administered Medications  Medication Dose Route Frequency Provider Last Rate Last Dose  . acetaminophen (TYLENOL) tablet 650 mg  650 mg Oral Q6H PRN Enid Baas, MD       Or  . acetaminophen (TYLENOL) suppository 650 mg  650 mg Rectal Q6H PRN Enid Baas, MD      . amantadine (SYMMETREL) capsule 100 mg  100 mg Oral BID Enid Baas, MD      . enoxaparin (LOVENOX) injection 40 mg  40 mg Subcutaneous Q24H Enid Baas, MD      . haloperidol (HALDOL) tablet 10 mg  10 mg Oral BID Enid Baas, MD      . Melene Muller ON 04/21/2018] haloperidol decanoate (HALDOL DECANOATE) 100 MG/ML injection 100 mg  100 mg Intramuscular Q30 days Enid Baas, MD      . ondansetron [facility]) tablet 4 mg  4 mg Oral Q6H PRN Enid Baas, MD       Or  . ondansetron (ZOFRAN) injection 4 mg  4 mg Intravenous Q6H PRN Enid Baas, MD      . piperacillin-tazobactam (ZOSYN) IVPB 3.375 g  3.375 g Intravenous Q8H Enid Baas, MD 12.5 mL/hr at 04/06/18 1351 3.375 g at 04/06/18 1351  . sodium bicarbonate 150 mEq in dextrose 5 % 1,000 mL infusion   Intravenous Continuous Enid Baas, MD 75 mL/hr at 04/06/18 0854    . traZODone (DESYREL) tablet 100 mg  100 mg  Oral QHS Enid BaasKalisetti, Radhika, MD      . vancomycin (VANCOCIN) IVPB 1000 mg/200 mL premix  1,000 mg Intravenous Q18H Enid BaasKalisetti, Radhika, MD 200 mL/hr at 04/06/18 1350 1,000 mg at 04/06/18 1350     Discharge Medications: Please see discharge summary for a list of discharge medications.  Relevant Imaging Results:  Relevant Lab Results:   Additional Information    Roberto Vasquez  Roberto Vasquez, [address]CS[address]

## 2018-04-06 NOTE — ED Notes (Signed)
Darl PikesSusan LPN, [facility] Team Administrator, called to state that this pt was from a group home but they did not know which one and that he has been confused since admission to the jail - they report that the only medication pt is on is Keflex for a UTI that the hospital dx him with

## 2018-04-06 NOTE — ED Provider Notes (Signed)
[facility] Emergency Department Provider Note ________   First MD Initiated Contact with Patient 04/06/18 [phone]     (approximate)  I have reviewed the triage vital signs and the nursing notes.  Level 5 caveat: History and physical exam limited secondary to altered mental status. HISTORY  Chief Complaint Loss of Consciousness    HPI Roberto Vasquez is a 57 y.o. male with below list of chronic medical conditions including schizophrenia, seizure disorder and recently diagnosed urinary tract infection on 03/31/2018.  Presents emergency department from the [facility] jail in custody with history of being found unresponsive in his cell.  Patient apparently defecated in his previous cell and was moved to a new cell where he was found unresponsive.  Patient has been in custody of the [facility] jail since 04/01/2018.   Past Medical History:  Diagnosis Date  . Anxiety   . Hepatitis C   . Hypertension   . Schizophrenia (HCC)   . Seizures [facility])     Patient Active Problem List   Diagnosis Date Noted  . Tobacco use disorder 02/23/2017  . Schizophrenia (HCC) 02/20/2017  . Noncompliance 02/20/2017  . HTN (hypertension) 03/01/2015    Past Surgical History:  Procedure Laterality Date  . ABDOMINAL SURGERY    . VENTRAL HERNIA REPAIR N/A 10/20/2016   Procedure: HERNIA REPAIR VENTRAL ADULT;  Surgeon: Lattie Haw, MD;  Location: [facility] ORS;  Service: General;  Laterality: N/A;    Prior to Admission medications   Medication Sig Start Date End Date Taking? Authorizing Provider  amantadine (SYMMETREL) 100 MG capsule Take 1 capsule (100 mg total) by mouth 2 (two) times daily. 02/26/17   Jimmy Footman, MD  cephALEXin (KEFLEX) 500 MG capsule Take 1 capsule (500 mg total) by mouth 2 (two) times daily. 03/31/18   Sharman Cheek, MD  haloperidol (HALDOL) 10 MG tablet Take 1 tablet (10 mg total) by mouth 2 (two) times daily. 02/26/17    Jimmy Footman, MD  haloperidol decanoate (HALDOL DECANOATE) 100 MG/ML injection Inject 1 mL (100 mg total) into the muscle every 30 (thirty) days. 03/20/17   Jimmy Footman, MD  traZODone (DESYREL) 100 MG tablet Take 1 tablet (100 mg total) by mouth at bedtime. 02/26/17   Jimmy Footman, MD    Allergies No known drug allergies  Family History  Problem Relation Age of Onset  . Hypertension Mother     Social History Social History   Tobacco Use  . Smoking status: Current Every Day Smoker    Packs/day: 1.00    Years: 40.00    Pack years: 40.00    Types: Cigarettes  . Smokeless tobacco: Never Used  Substance Use Topics  . Alcohol use: No  . Drug use: No    Review of Systems Constitutional: No fever/chills Eyes: No visual changes. ENT: No sore throat. Cardiovascular: Denies chest pain. Respiratory: Denies shortness of breath. Gastrointestinal: No abdominal pain.  No nausea, no vomiting.  No diarrhea.  No constipation. Genitourinary: Negative for dysuria. Musculoskeletal: Negative for neck pain.  Negative for back pain. Integumentary: Negative for rash. Neurological: Positive for altered mental status  ____________________________________________   PHYSICAL EXAM:  VITAL SIGNS: ED Triage Vitals  Enc Vitals Group     BP 04/06/18 0514 (!) 144/99     Pulse Rate 04/06/18 0514 (!) 138     Resp 04/06/18 0514 (!) 38     Temp 04/06/18 0514 97.8 F (36.6 C)     Temp Source 04/06/18  1610 Axillary     SpO2 04/06/18 0514 99 %     Weight 04/06/18 0510 86.2 kg (190 lb)     Height 04/06/18 0510 1.829 m (6')     Head Circumference --      Peak Flow --      Pain Score --      Pain Loc --      Pain Edu? --      Excl. in GC? --      Constitutional: Alert but confused patient states he is 57 years old.   Eyes: Conjunctivae are normal. PERRL. EOMI. Head: Atraumatic. Mouth/Throat: Mucous membranes are moist.  Oropharynx  non-erythematous. Neck: No stridor.  No meningeal signs.   Cardiovascular: Normal rate, regular rhythm. Good peripheral circulation. Grossly normal heart sounds. Respiratory: Normal respiratory effort.  No retractions. Lungs CTAB. Gastrointestinal: Soft and nontender. No distention.  Musculoskeletal: No lower extremity tenderness nor edema. No gross deformities of extremities. Neurologic:  Normal speech and language. No gross focal neurologic deficits are appreciated.  Skin:  Skin is warm, dry and intact. No rash noted. Psychiatric: Bizarre affect. ____________________________________________   LABS (all labs ordered are listed, but only abnormal results are displayed)  Labs Reviewed  COMPREHENSIVE METABOLIC PANEL - Abnormal; Notable for the following components:      Result Value   CO2 13 (*)    Glucose, Bld 180 (*)    BUN 21 (*)    Creatinine, Ser 1.79 (*)    Total Protein 8.3 (*)    AST 60 (*)    Total Bilirubin 1.4 (*)    GFR calc non Af Amer 40 (*)    GFR calc Af Amer 46 (*)    Anion gap 25 (*)    All other components within normal limits  GLUCOSE, CAPILLARY - Abnormal; Notable for the following components:   Glucose-Capillary 157 (*)    All other components within normal limits  LACTIC ACID, PLASMA - Abnormal; Notable for the following components:   Lactic Acid, Venous 10.7 (*)    All other components within normal limits  CULTURE, BLOOD (ROUTINE X 2)  CULTURE, BLOOD (ROUTINE X 2)  URINE CULTURE  CBC  TROPONIN I  URINALYSIS, COMPLETE (UACMP) WITH MICROSCOPIC  LACTIC ACID, PLASMA  CK   ____________________________________________  EKG  ED ECG REPORT I, [place] N Riyah Bardon, the attending physician, personally viewed and interpreted this ECG.   Date: 04/06/2018  EKG Time: 5:12 AM  Rate: 139  Rhythm: Sinus tachycardia  Axis: Normal  Intervals: Normal  ST&T Change: None  ____________________________________________  RADIOLOGY I, [place] N Kellen Hover, personally  viewed and evaluated these images (plain radiographs) as part of my medical decision making, as well as reviewing the written report by the radiologist.  ED MD interpretation: No acute intracranial pathology noted on CT scan of the head.  Official radiology report(s): Ct Head Wo Contrast  Result Date: 04/06/2018 CLINICAL DATA:  57 year old male with altered mental status. EXAM: CT HEAD WITHOUT CONTRAST TECHNIQUE: Contiguous axial images were obtained from the base of the skull through the vertex without intravenous contrast. COMPARISON:  Head CT dated 12/19/2015 FINDINGS: Brain: The ventricles and sulci appropriate size for patient's age. Minimal periventricular and deep white matter chronic microvascular ischemic changes. There is no acute intracranial hemorrhage. No mass effect or midline shift. No extra-axial fluid collection. Vascular: No hyperdense vessel or unexpected calcification. Skull: Normal. Negative for fracture or focal lesion. Sinuses/Orbits: No acute finding. Other: None IMPRESSION: No acute intracranial  pathology. Electronically Signed   By: Elgie CollardArash  Radparvar M.D.   On: 04/06/2018 05:51   Dg Chest Port 1 View  Result Date: 04/06/2018 CLINICAL DATA:  57 year old male with tachypnea EXAM: PORTABLE CHEST 1 VIEW COMPARISON:  Chest radiograph dated 03/01/2015 FINDINGS: The heart size and mediastinal contours are within normal limits. Both lungs are clear. The visualized skeletal structures are unremarkable. IMPRESSION: No active disease. Electronically Signed   By: Elgie CollardArash  Radparvar M.D.   On: 04/06/2018 06:14      Critical Care performed:    .Critical Care Performed by: Darci CurrentBrown, [place] N, MD Authorized by: Darci CurrentBrown, [place] N, MD   Critical care provider statement:    Critical care time (minutes):  45   Critical care time was exclusive of:  Separately billable procedures and treating other patients and teaching time   Critical care was necessary to treat or prevent imminent or  life-threatening deterioration of the following conditions:  Sepsis   Critical care was time spent personally by me on the following activities:  Development of treatment plan with patient or surrogate, discussions with consultants, evaluation of patient's response to treatment, examination of patient, obtaining history from patient or surrogate, ordering and performing treatments and interventions, ordering and review of laboratory studies, ordering and review of radiographic studies, pulse oximetry, re-evaluation of patient's condition and review of old charts   I assumed direction of critical care for this patient from another provider in my specialty: no       ____________________________________________   INITIAL IMPRESSION / ASSESSMENT AND PLAN / ED COURSE  As part of my medical decision making, I reviewed the following data within the electronic MEDICAL RECORD NUMBER   57 year old male [facility] with above-stated history and physical exam secondary to altered mental status.  Patient was found unresponsive in his cell as stated.  Concern for possible seizure with postictal state.  However also concern for the possibility of sepsis as the patient was seen in the emergency department on 03/31/2018 and diagnosed with a urinary tract infection concerned that this was not treated or undertreated.  Patient presented today tachycardic tachypneic and as such raises a concern for possible sepsis.  Sepsis protocol was initiated and patient received appropriate IV antibiotic therapy as well as appropriate normal saline.  Patient's laboratory data notable for lactic acid of 10.7 which may be secondary to sepsis however could also be secondary to the fact that the patient may have had a prolonged seizure.  Patient discussed with Dr. Sheryle Hailiamond for hospital admission further evaluation and management. ____________________________________________  FINAL CLINICAL  IMPRESSION(S) / ED DIAGNOSES  Final diagnoses:  Altered mental status, unspecified altered mental status type  Sepsis, due to unspecified organism (HCC)     MEDICATIONS GIVEN DURING THIS VISIT:  Medications  piperacillin-tazobactam (ZOSYN) IVPB 3.375 g (has no administration in time range)  vancomycin (VANCOCIN) IVPB 1000 mg/200 mL premix (has no administration in time range)  sodium chloride 0.9 % bolus 1,000 mL (1,000 mLs Intravenous New Bag/Given 04/06/18 [id number])     ED Discharge Orders    None       Note:  This document was prepared using Dragon voice recognition software and may include unintentional dictation errors.    Darci CurrentBrown, [place] N, MD 04/06/18 [phone]

## 2018-04-06 NOTE — ED Triage Notes (Signed)
[facility] with complaints of being found unresponsive in the cell. Pt had defecated in a previous cell and was moved to that block afterwards. Pt has Hx of seizures. Pt is repeating everything EMS is saying.  EMS placed an 18 gauge in his left forearm. VS per EMS BP-148/72 Temp-98.7 HR-148-160 BS-123. Pt is alert but not oriented. Pt states that his back hurts. Pt is handcuffed.

## 2018-04-06 NOTE — Procedures (Signed)
ELECTROENCEPHALOGRAM REPORT   Patient: Roberto Vasquez       Room #: 125A-AA EEG No. ID: [id number] Age: 57 y.o.        Sex: male Referring Physician: Nemiah CommanderKalisetti Report Date:  04/06/2018        Interpreting Physician: Thana FarrEYNOLDS, Gola Bribiesca  History: Roberto JosephJerome Walter Sliwinski is an 57 y.o. male with infection and altered mental status  Medications:  Symmetrel, Haldol, Zosyn, Vancomycin  Conditions of Recording:  This is a 16 channel EEG carried out with the patient in the awake and poorly cooerative state.  Description:  The waking background activity is very low voltage and slow.  It consists of a polymorphic delta rhythm that is diffusely distributed.  At times some very low voltage theta activity can be seen superimposed as well.  The patient has frequent eye movements and artifact from the eye movements are noted anteriorly for a significant portion of the recording.   The patient does not drowse or sleep. No epileptiform activity is noted.   Hyperventilation was not performed.  The patient was poorly tolerant of intermittent photic stimulation and this had to be aborted prior to completion.   IMPRESSION: This is an abnormal EEG secondary to general background slowing.  This finding may be seen with a diffuse disturbance that is etiologically nonspecific, but may include a metabolic encephalopathy, among other possibilities.  No epileptiform activity was noted.     Thana FarrLeslie Neelam Tiggs, MD Neurology [phone] 04/06/2018, 1:14 PM

## 2018-04-06 NOTE — Progress Notes (Signed)
eeg completed ° °

## 2018-04-06 NOTE — Progress Notes (Signed)
Pharmacy Antibiotic Note  Roberto Vasquez is a 57 y.o. male admitted on 04/06/2018 with sepsis. Discharged with UTI 7/10 but incarcerated since and unclear if treatment was continued.  Pharmacy has been consulted for vancomycin and Zosyn dosing.  Plan: Zosyn 3.375g IV q8h (4 hour infusion).   DW 75kg  Vd 53L kei 0.043 hr-1  T1/2 16 hours Vancomycin 1 gram q 18 hours ordered with stacked dosing. Level before 5th dose. Goal trough 15-20   Height: 6' (182.9 cm) Weight: 164 lb 6.4 oz (74.6 kg) IBW/kg (Calculated) : 77.6  Temp (24hrs), Avg:98.2 F (36.8 C), Min:97.8 F (36.6 C), Max:98.6 F (37 C)  Recent Labs  Lab 04/06/18 0509 04/06/18 0518  WBC 9.3  --   CREATININE 1.79*  --   LATICACIDVEN  --  10.7*    Estimated Creatinine Clearance: 46.9 mL/min (A) (by C-G formula based on SCr of 1.79 mg/dL (H)).    No Known Allergies  Antimicrobials this admission: Vancomycin, Zosyn 7/16  >>    >>   Dose adjustments this admission:   Microbiology results: 7/16 BCx: pending 7/16 UCx: pending  7/16 MRSA PCR: pending      7/16 CXR: no active disease 7/16 UA: (-)  Thank you for allowing pharmacy to be a part of this patient's care.  Jerauld Bostwick S 04/06/2018 10:31 AM
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
## 2018-04-06 NOTE — ED Notes (Signed)
Pt is confused and does not answer questions appropriately when asked - he is oriented to person and place but not circumstances

## 2018-04-07 LAB — CBC
HCT: 36.7 % — ABNORMAL LOW (ref 40.0–52.0)
Hemoglobin: 12.8 g/dL — ABNORMAL LOW (ref 13.0–18.0)
MCH: 33 pg (ref 26.0–34.0)
MCHC: 34.9 g/dL (ref 32.0–36.0)
MCV: 94.7 fL (ref 80.0–100.0)
PLATELETS: 170 10*3/uL (ref 150–440)
RBC: 3.88 MIL/uL — ABNORMAL LOW (ref 4.40–5.90)
RDW: 11.8 % (ref 11.5–14.5)
WBC: 7.1 10*3/uL (ref 3.8–10.6)

## 2018-04-07 LAB — BASIC METABOLIC PANEL
Anion gap: 7 (ref 5–15)
BUN: 15 mg/dL (ref 6–20)
CALCIUM: 8.6 mg/dL — AB (ref 8.9–10.3)
CO2: 29 mmol/L (ref 22–32)
CREATININE: 1.7 mg/dL — AB (ref 0.61–1.24)
Chloride: 107 mmol/L (ref 98–111)
GFR calc Af Amer: 49 mL/min — ABNORMAL LOW (ref >60)
GFR calc non Af Amer: 42 mL/min — ABNORMAL LOW (ref >60)
GLUCOSE: 93 mg/dL (ref 70–99)
Potassium: 3.3 mmol/L — ABNORMAL LOW (ref 3.5–5.1)
SODIUM: 143 mmol/L (ref 135–145)

## 2018-04-07 LAB — URINE CULTURE: CULTURE: NO GROWTH

## 2018-04-07 MED ORDER — QUETIAPINE FUMARATE 25 MG PO TABS: 100.0000 mg | ORAL_TABLET | Freq: Every day | ORAL | Status: DC

## 2018-04-07 MED ORDER — QUETIAPINE FUMARATE 100 MG PO TABS
100.0000 mg | ORAL_TABLET | Freq: Every day | ORAL | Status: DC
  Administered 2018-04-07 – 2018-04-08 (×2): 100 mg via ORAL
  Filled 2018-04-07: qty 1
  Filled 2018-04-07 (×2): qty 4
  Filled 2018-04-07 (×2): qty 1

## 2018-04-07 MED ORDER — POTASSIUM CHLORIDE CRYS ER 20 MEQ PO TBCR
40.0000 meq | EXTENDED_RELEASE_TABLET | Freq: Once | ORAL | Status: AC
  Administered 2018-04-07: 13:00:00 40 meq via ORAL
  Filled 2018-04-07: qty 2

## 2018-04-07 MED ORDER — LEVETIRACETAM 250 MG PO TABS
250.0000 mg | ORAL_TABLET | Freq: Two times a day (BID) | ORAL | Status: DC
  Administered 2018-04-07 – 2018-04-09 (×4): 250 mg via ORAL
  Filled 2018-04-07 (×5): qty 1

## 2018-04-07 NOTE — Consult Note (Signed)
Reason for Consult:Loss of consciousness Referring Physician: Pyreddy  CC: Loss of consciousness  HPI: Roberto Vasquez is an 57 y.o. male with a history of schizophrenia and seizure disorder who is unable to provide much history today.  There is no family available therefore all history obtained from the chart.  Patient was brought in from [facility] jail in custody with history of being found unresponsive in his cell.  Patient apparently defecated in his previous cell and was moved to a new cell where he was found unresponsive.  Patient had been in custody of the [facility] jail since 04/01/2018.  Patient is now awake and alert.   Patient reports he has a history of seizures.  Does not know what causes his seizures but is on no anticonvulsant therapy.  Reports that he has them when he is upset.  Does not recall when his last seizure was.   Past Medical History:  Diagnosis Date  . Anxiety   . Hepatitis C   . Hypertension   . Schizophrenia (HCC)   . Seizures (HCC)     Past Surgical History:  Procedure Laterality Date  . ABDOMINAL SURGERY    . VENTRAL HERNIA REPAIR N/A 10/20/2016   Procedure: HERNIA REPAIR VENTRAL ADULT;  Surgeon: Lattie Haw, MD;  Location: [facility] ORS;  Service: General;  Laterality: N/A;    Family History  Problem Relation Age of Onset  . Hypertension Mother     Social History:  reports that he has been smoking cigarettes.  He has a 40.00 pack-year smoking history. He has never used smokeless tobacco. He reports that he does not drink alcohol or use drugs.  No Known Allergies  Medications:  I have reviewed the patient's current medications. Prior to Admission:  Medications Prior to Admission  Medication Sig Dispense Refill Last Dose  . amLODipine (NORVASC) 10 MG tablet Take 10 mg by mouth daily.     . ARIPiprazole ER (ABILIFY MAINTENA) 300 MG PRSY prefilled syringe Inject 300 mg into the muscle every 28 (twenty-eight) days.     . cephALEXin  (KEFLEX) 500 MG capsule Take 1 capsule (500 mg total) by mouth 2 (two) times daily. 14 capsule 0   . hydrochlorothiazide (HYDRODIURIL) 12.5 MG tablet Take 12.5 mg by mouth daily.     Marland Kitchen lisinopril (PRINIVIL,ZESTRIL) 20 MG tablet Take 20 mg by mouth daily.     . QUEtiapine (SEROQUEL) 100 MG tablet Take 100 mg by mouth at bedtime.     . traZODone (DESYREL) 100 MG tablet Take 1 tablet (100 mg total) by mouth at bedtime. 30 tablet 0   . amantadine (SYMMETREL) 100 MG capsule Take 1 capsule (100 mg total) by mouth 2 (two) times daily. (Patient not taking: Reported on 04/06/2018) 60 capsule 0 Not Taking at Unknown time  . haloperidol (HALDOL) 10 MG tablet Take 1 tablet (10 mg total) by mouth 2 (two) times daily. (Patient not taking: Reported on 04/06/2018) 60 tablet 0 Not Taking at Unknown time  . haloperidol decanoate (HALDOL DECANOATE) 100 MG/ML injection Inject 1 mL (100 mg total) into the muscle every 30 (thirty) days. (Patient not taking: Reported on 04/06/2018) 1 mL 0 Not Taking at Unknown time   Scheduled: . amantadine  100 mg Oral BID  . enoxaparin (LOVENOX) injection  40 mg Subcutaneous Q24H  . haloperidol  10 mg Oral BID  . [START ON 04/21/2018] haloperidol decanoate  100 mg Intramuscular Q30 days  . levETIRAcetam  500 mg Oral BID  .  QUEtiapine  100 mg Oral QHS  . traZODone  100 mg Oral QHS    ROS: History obtained from the patient  General ROS: negative for - chills, fatigue, fever, night sweats, weight gain or weight loss Psychological ROS: negative for - behavioral disorder, hallucinations, memory difficulties, mood swings or suicidal ideation Ophthalmic ROS: negative for - blurry vision, double vision, eye pain or loss of vision ENT ROS: negative for - epistaxis, nasal discharge, oral lesions, sore throat, tinnitus or vertigo Allergy and Immunology ROS: negative for - hives or itchy/watery eyes Hematological and Lymphatic ROS: negative for - bleeding problems, bruising or swollen lymph  nodes Endocrine ROS: negative for - galactorrhea, hair pattern changes, polydipsia/polyuria or temperature intolerance Respiratory ROS: negative for - cough, hemoptysis, shortness of breath or wheezing Cardiovascular ROS: negative for - chest pain, dyspnea on exertion, edema or irregular heartbeat Gastrointestinal ROS: negative for - abdominal pain, diarrhea, hematemesis, nausea/vomiting or stool incontinence Genito-Urinary ROS: negative for - dysuria, hematuria, incontinence or urinary frequency/urgency Musculoskeletal ROS: negative for - joint swelling or muscular weakness Neurological ROS: as noted in HPI Dermatological ROS: negative for rash and skin lesion changes  Physical Examination: Blood pressure 109/85, pulse (!) 52, temperature (!) 97.4 F (36.3 C), temperature source Oral, resp. rate 12, height 6' (1.829 m), weight 74.6 kg (164 lb 6.4 oz), SpO2 100 %.  HEENT-  Normocephalic, no lesions, without obvious abnormality.  Normal external eye and conjunctiva.  Normal TM's bilaterally.  Normal auditory canals and external ears. Normal external nose, mucus membranes and septum.  Normal pharynx. Cardiovascular- S1, S2 normal, pulses palpable throughout   Lungs- chest clear, no wheezing, rales, normal symmetric air entry Abdomen- soft, non-tender; bowel sounds normal; no masses,  no organomegaly Extremities- no edema Lymph-no adenopathy palpable Musculoskeletal-no joint tenderness, deformity or swelling Skin-warm and dry, no hyperpigmentation, vitiligo, or suspicious lesions  Neurological Examination   Mental Status: Alert and cooperative.  Speech fluent without evidence of aphasia.  Able to follow 3 step commands without difficulty. Cranial Nerves: II: Discs flat bilaterally; Visual fields grossly normal, pupils equal, round, reactive to light and accommodation III,IV, VI: ptosis not present, extra-ocular motions intact bilaterally V,VII: smile symmetric, facial light touch sensation  normal bilaterally VIII: hearing normal bilaterally IX,X: gag reflex present XI: bilateral shoulder shrug XII: midline tongue extension Motor: Right : Upper extremity   5/5    Left:     Upper extremity   5/5  Lower extremity   5/5     Lower extremity   5/5 Tone and bulk:normal tone throughout; no atrophy noted Sensory: Pinprick and light touch intact throughout, bilaterally Deep Tendon Reflexes: 2+ and symmetric throughout Plantars: Right: downgoing   Left: downgoing Cerebellar: Normal finger-to-nose and normal heel-to-shin testing bilaterally Gait: not tested due to safety concerns    Laboratory Studies:   Basic Metabolic Panel: Recent Labs  Lab 04/06/18 0509 04/07/18 0515  NA 143 143  K 3.5 3.3*  CL 105 107  CO2 13* 29  GLUCOSE 180* 93  BUN 21* 15  CREATININE 1.79* 1.70*  CALCIUM 9.3 8.6*    Liver Function Tests: Recent Labs  Lab 04/06/18 0509  AST 60*  ALT 22  ALKPHOS 61  BILITOT 1.4*  PROT 8.3*  ALBUMIN 4.4   No results for input(s): LIPASE, AMYLASE in the last 168 hours. No results for input(s): AMMONIA in the last 168 hours.  CBC: Recent Labs  Lab 04/06/18 0509 04/07/18 0515  WBC 9.3 7.1  HGB 14.7  12.8*  HCT 43.4 36.7*  MCV 98.3 94.7  PLT 223 170    Cardiac Enzymes: Recent Labs  Lab 04/06/18 0509  CKTOTAL 532*  TROPONINI <0.03    BNP: Invalid input(s): POCBNP  CBG: Recent Labs  Lab 04/06/18 0511  GLUCAP 157*    Microbiology: Results for orders placed or performed during the hospital encounter of 04/06/18  Blood Culture (routine x 2)     Status: None (Preliminary result)   Collection Time: 04/06/18  5:18 AM  Result Value Ref Range Status   Specimen Description BLOOD RIGHT FATTY CASTS  Final   Special Requests   Final    BOTTLES DRAWN AEROBIC AND ANAEROBIC Blood Culture adequate volume   Culture   Final    NO GROWTH < 24 HOURS Performed at [facility], [address], [place], [place] [address]    Report Status  PENDING  Incomplete  Blood Culture (routine x 2)     Status: None (Preliminary result)   Collection Time: 04/06/18  5:18 AM  Result Value Ref Range Status   Specimen Description   Final    BLOOD BLOOD LEFT FOREARM Performed at [facility], [address] [place], [place] [address]    Special Requests   Final    BOTTLES DRAWN AEROBIC AND ANAEROBIC Blood Culture adequate volume Performed at [facility], [address] [place], [place] [address]    Culture  Setup Time   Final    GRAM POSITIVE COCCI AEROBIC BOTTLE ONLY CRITICAL RESULT CALLED TO, READ BACK BY AND VERIFIED WITH: DAVID BESANTI ON 04/06/18 AT 2328 JAG Performed at [facility], [address] [place], [place] [address]    Culture GRAM POSITIVE COCCI  Final   Report Status PENDING  Incomplete  Blood Culture ID Panel (Reflexed)     Status: Abnormal   Collection Time: 04/06/18  5:18 AM  Result Value Ref Range Status   Enterococcus species NOT DETECTED NOT DETECTED Final   Listeria monocytogenes NOT DETECTED NOT DETECTED Final   Staphylococcus species DETECTED (A) NOT DETECTED Final    Comment: Methicillin (oxacillin) resistant coagulase negative staphylococcus. Possible blood culture contaminant (unless isolated from more than one blood culture draw or clinical case suggests pathogenicity). No antibiotic treatment is indicated for blood  culture contaminants. CRITICAL RESULT CALLED TO, READ BACK BY AND VERIFIED WITH: DAVID BESANTI ON 04/06/18 AT 2328 BY JAG    Staphylococcus aureus NOT DETECTED NOT DETECTED Final   Methicillin resistance DETECTED (A) NOT DETECTED Final    Comment: CRITICAL RESULT CALLED TO, READ BACK BY AND VERIFIED WITH: DAVID BESANTI ON 04/06/18 AT 2328 BY JAG    Streptococcus species NOT DETECTED NOT DETECTED Final   Streptococcus agalactiae NOT DETECTED NOT DETECTED Final   Streptococcus pneumoniae NOT DETECTED NOT DETECTED Final   Streptococcus pyogenes NOT DETECTED NOT  DETECTED Final   Acinetobacter baumannii NOT DETECTED NOT DETECTED Final   Enterobacteriaceae species NOT DETECTED NOT DETECTED Final   Enterobacter cloacae complex NOT DETECTED NOT DETECTED Final   Escherichia coli NOT DETECTED NOT DETECTED Final   Klebsiella oxytoca NOT DETECTED NOT DETECTED Final   Klebsiella pneumoniae NOT DETECTED NOT DETECTED Final   Proteus species NOT DETECTED NOT DETECTED Final   Serratia marcescens NOT DETECTED NOT DETECTED Final   Haemophilus influenzae NOT DETECTED NOT DETECTED Final   Neisseria meningitidis NOT DETECTED NOT DETECTED Final   Pseudomonas aeruginosa NOT DETECTED NOT DETECTED Final   Candida albicans  NOT DETECTED NOT DETECTED Final   Candida glabrata NOT DETECTED NOT DETECTED Final   Candida krusei NOT DETECTED NOT DETECTED Final   Candida parapsilosis NOT DETECTED NOT DETECTED Final   Candida tropicalis NOT DETECTED NOT DETECTED Final    Comment: Performed at [facility], [address], [place], [place] [address]  Urine culture     Status: None   Collection Time: 04/06/18  7:15 AM  Result Value Ref Range Status   Specimen Description   Final    URINE, RANDOM Performed at [facility], [address], [place], [place] [address]    Special Requests   Final    NONE Performed at [facility], [address], [place], [place] [address]    Culture   Final    NO GROWTH Performed at [facility], [address] [place], [place] [address]    Report Status 04/07/2018 FINAL  Final  MRSA PCR Screening     Status: None   Collection Time: 04/06/18 10:19 AM  Result Value Ref Range Status   MRSA by PCR NEGATIVE NEGATIVE Final    Comment:        The GeneXpert MRSA Assay (FDA approved for NASAL specimens only), is one component of a comprehensive MRSA colonization surveillance program. It is not intended to diagnose MRSA infection nor to guide or monitor treatment for MRSA infections. Performed at  [facility], [address] [place], [place] [address]     Coagulation Studies: No results for input(s): LABPROT, INR in the last 72 hours.  Urinalysis:  Recent Labs  Lab 04/06/18 0715  COLORURINE YELLOW*  LABSPEC 1.014  PHURINE 5.0  GLUCOSEU NEGATIVE  HGBUR MODERATE*  BILIRUBINUR NEGATIVE  KETONESUR NEGATIVE  PROTEINUR 100*  NITRITE NEGATIVE  LEUKOCYTESUR NEGATIVE    Lipid Panel:     Component Value Date/Time   CHOL 138 02/22/2017 0650   TRIG 79 02/22/2017 0650   HDL 36 (L) 02/22/2017 0650   CHOLHDL 3.8 02/22/2017 0650   VLDL 16 02/22/2017 0650   LDLCALC 86 02/22/2017 0650    HgbA1C:  Lab Results  Component Value Date   HGBA1C 4.4 (L) 02/22/2017    Urine Drug Screen:      Component Value Date/Time   LABOPIA NONE DETECTED 04/06/2018 0715   COCAINSCRNUR NONE DETECTED 04/06/2018 0715   LABBENZ NONE DETECTED 04/06/2018 0715   AMPHETMU NONE DETECTED 04/06/2018 0715   THCU NONE DETECTED 04/06/2018 0715   LABBARB (A) 04/06/2018 0715    Result not available. Reagent lot number recalled by manufacturer.    Alcohol Level: No results for input(s): ETH in the last 168 hours.  Other results: EKG: sinus tachycardia at 139 bpm  Imaging: Ct Head Wo Contrast  Result Date: 04/06/2018 CLINICAL DATA:  57 year old male with altered mental status. EXAM: CT HEAD WITHOUT CONTRAST TECHNIQUE: Contiguous axial images were obtained from the base of the skull through the vertex without intravenous contrast. COMPARISON:  Head CT dated 12/19/2015 FINDINGS: Brain: The ventricles and sulci appropriate size for patient's age. Minimal periventricular and deep white matter chronic microvascular ischemic changes. There is no acute intracranial hemorrhage. No mass effect or midline shift. No extra-axial fluid collection. Vascular: No hyperdense vessel or unexpected calcification. Skull: Normal. Negative for fracture or focal lesion. Sinuses/Orbits: No acute finding. Other: None  IMPRESSION: No acute intracranial pathology. Electronically Signed   By: Elgie Collard M.D.   On: 04/06/2018 05:51   Dg Chest [facility]  Result Date: 04/06/2018 CLINICAL DATA:  57 year old male with tachypnea EXAM: PORTABLE CHEST 1 VIEW COMPARISON:  Chest radiograph dated 03/01/2015 FINDINGS: The heart size and mediastinal contours are within normal limits. Both lungs are clear. The visualized skeletal structures are unremarkable. IMPRESSION: No active disease. Electronically Signed   By: Elgie Collard M.D.   On: 04/06/2018 06:14     Assessment/Plan: 57 year old male with a history of schizophrenia and seizures.  Found unresponsive in his jail cell.  Also noted to defecate in his cell.  No witnessed seizures.  Although with a history of seizures, no further information available.  On no anticonvulsant therapy.  EEG slow but with no epileptiform activity.  Head CT reviewed and shows no acute changes.    Recommendations: 1.  Would not continue Keppra at this time but would start to taper.  Would decrease to 250mg  BID for 1-2 days then discontinue unless definitive seizure activity noted.   2.  Continue seizure precautions    LOS: 1 day   Thana Farr, MD Neurology [phone] 04/07/2018  1:32 PM

## 2018-04-07 NOTE — Progress Notes (Signed)
PHARMACY - PHYSICIAN COMMUNICATION CRITICAL VALUE ALERT - BLOOD CULTURE IDENTIFICATION (BCID)  Roberto Vasquez is an 57 y.o. male who [facility]2018 with a chief complaint of loss of consciousness s/t seizure s/t metabolic encephalopathy  Assessment:  Tachycardic, tachypneic, LA 10.7 >> 1.1, WBC 9.3 WNL, CT head negative, CXR negative, EEG shows metabolic encephalopathy w/o epileptiform activity, MRSA PCR negative, 1/4 [facility] BCID Staph species MecA +  Name of physician (or Provider) Contacted: Oralia Manisavid Willis  Current antibiotics: Vanc/zosyn  Changes to prescribed antibiotics recommended:  Recommendations accepted by provider -- will discontinue vanc considering BCID is possibly a contaminant/MRSA PCR negative and patient does not meet SIRS criteria or have evidence of infection, will continue to f/u cx/sx and manage abx therapy as necessary  Results for orders placed or performed during the hospital encounter of 04/06/18  Blood Culture ID Panel (Reflexed) (Collected: 04/06/2018  5:18 AM)  Result Value Ref Range   Enterococcus species NOT DETECTED NOT DETECTED   Listeria monocytogenes NOT DETECTED NOT DETECTED   Staphylococcus species DETECTED (A) NOT DETECTED   Staphylococcus aureus NOT DETECTED NOT DETECTED   Methicillin resistance DETECTED (A) NOT DETECTED   Streptococcus species NOT DETECTED NOT DETECTED   Streptococcus agalactiae NOT DETECTED NOT DETECTED   Streptococcus pneumoniae NOT DETECTED NOT DETECTED   Streptococcus pyogenes NOT DETECTED NOT DETECTED   Acinetobacter baumannii NOT DETECTED NOT DETECTED   Enterobacteriaceae species NOT DETECTED NOT DETECTED   Enterobacter cloacae complex NOT DETECTED NOT DETECTED   Escherichia coli NOT DETECTED NOT DETECTED   Klebsiella oxytoca NOT DETECTED NOT DETECTED   Klebsiella pneumoniae NOT DETECTED NOT DETECTED   Proteus species NOT DETECTED NOT DETECTED   Serratia marcescens NOT DETECTED NOT DETECTED   Haemophilus influenzae NOT DETECTED NOT DETECTED   Neisseria meningitidis NOT DETECTED NOT DETECTED   Pseudomonas aeruginosa NOT DETECTED NOT DETECTED   Candida albicans NOT DETECTED NOT DETECTED   Candida glabrata NOT DETECTED NOT DETECTED   Candida krusei NOT DETECTED NOT DETECTED   Candida parapsilosis NOT DETECTED NOT DETECTED   Candida tropicalis NOT DETECTED NOT DETECTED   Thomasene Rippleavid Waldemar Siegel, PharmD, BCPS Clinical Pharmacist 04/07/2018

## 2018-04-07 NOTE — Progress Notes (Signed)
[facility] - [place] [facility]   PATIENT NAME: Roberto Vasquez    MR#:  [id number]  DATE OF BIRTH:  09-05-1961  SUBJECTIVE:  CHIEF COMPLAINT:   Chief Complaint  Patient presents with  . Loss of Consciousness  Patient seen and evaluated today  is awake and responds to verbal commands Not agitated   REVIEW OF SYSTEMS:    ROS  CONSTITUTIONAL: No documented fever. No fatigue, weakness. No weight gain, no weight loss.  EYES: No blurry or double vision.  ENT: No tinnitus. No postnasal drip. No redness of the oropharynx.  RESPIRATORY: No cough, no wheeze, no hemoptysis. No dyspnea.  CARDIOVASCULAR: No chest pain. No orthopnea. No palpitations. No syncope.  GASTROINTESTINAL: No nausea, no vomiting or diarrhea. No abdominal pain. No melena or hematochezia.  GENITOURINARY: No dysuria or hematuria.  ENDOCRINE: No polyuria or nocturia. No heat or cold intolerance.  HEMATOLOGY: No anemia. No bruising. No bleeding.  INTEGUMENTARY: No rashes. No lesions.  MUSCULOSKELETAL: No arthritis. No swelling. No gout.  NEUROLOGIC: No numbness, tingling, or ataxia. No seizure-type activity.  PSYCHIATRIC: No anxiety. No insomnia. No ADD.   DRUG ALLERGIES:  No Known Allergies  VITALS:  Blood pressure 109/85, pulse (!) 52, temperature (!) 97.4 F (36.3 C), temperature source Oral, resp. rate 12, height 6' (1.829 m), weight 74.6 kg (164 lb 6.4 oz), SpO2 100 %.  PHYSICAL EXAMINATION:   Physical Exam  GENERAL:  57 y.o.-year-old patient lying in the bed with no acute distress.  EYES: Pupils equal, round, reactive to light and accommodation. No scleral icterus. Extraocular muscles intact.  HEENT: Head atraumatic, normocephalic. Oropharynx and nasopharynx clear.  NECK:  Supple, no jugular venous distention. No thyroid enlargement, no tenderness.  LUNGS: Normal breath sounds bilaterally, no wheezing, rales, rhonchi. No use of accessory muscles of respiration.  CARDIOVASCULAR: S1, S2  normal. No murmurs, rubs, or gallops.  ABDOMEN: Soft, nontender, nondistended. Bowel sounds present. No organomegaly or mass.  EXTREMITIES: No cyanosis, clubbing or edema b/l.    NEUROLOGIC: Cranial nerves II through XII are intact. No focal Motor or sensory deficits b/l.   PSYCHIATRIC: The patient is alert and oriented x 3.  SKIN: No obvious rash, lesion, or ulcer.   LABORATORY PANEL:   CBC Recent Labs  Lab 04/07/18 0515  WBC 7.1  HGB 12.8*  HCT 36.7*  PLT 170   ------------------------------------------------------------------------------------------------------------------ Chemistries  Recent Labs  Lab 04/06/18 0509 04/07/18 0515  NA 143 143  K 3.5 3.3*  CL 105 107  CO2 13* 29  GLUCOSE 180* 93  BUN 21* 15  CREATININE 1.79* 1.70*  CALCIUM 9.3 8.6*  AST 60*  --   ALT 22  --   ALKPHOS 61  --   BILITOT 1.4*  --    ------------------------------------------------------------------------------------------------------------------  Cardiac Enzymes Recent Labs  Lab 04/06/18 0509  TROPONINI <0.03   ------------------------------------------------------------------------------------------------------------------  RADIOLOGY:  Ct Head Wo Contrast  Result Date: 04/06/2018 CLINICAL DATA:  56 year old male with altered mental status. EXAM: CT HEAD WITHOUT CONTRAST TECHNIQUE: Contiguous axial images were obtained from the base of the skull through the vertex without intravenous contrast. COMPARISON:  Head CT dated 12/19/2015 FINDINGS: Brain: The ventricles and sulci appropriate size for patient's age. Minimal periventricular and deep white matter chronic microvascular ischemic changes. There is no acute intracranial hemorrhage. No mass effect or midline shift. No extra-axial fluid collection. Vascular: No hyperdense vessel or unexpected calcification. Skull: Normal. Negative for fracture or focal lesion. Sinuses/Orbits: No acute finding. Other:  None IMPRESSION: No acute  intracranial pathology. Electronically Signed   By: Elgie CollardArash  Radparvar M.D.   On: 04/06/2018 05:51   Dg Chest Port 1 View  Result Date: 04/06/2018 CLINICAL DATA:  57 year old male with tachypnea EXAM: PORTABLE CHEST 1 VIEW COMPARISON:  Chest radiograph dated 03/01/2015 FINDINGS: The heart size and mediastinal contours are within normal limits. Both lungs are clear. The visualized skeletal structures are unremarkable. IMPRESSION: No active disease. Electronically Signed   By: Elgie CollardArash  Radparvar M.D.   On: 04/06/2018 06:14     ASSESSMENT AND PLAN:  57 year old male patient with history of schizophrenia, hypertension was admitted to hospitalist service for change in mental status  -Acute encephalopathy probably metabolic in etiology Questionable seizure Status post neurology evaluation EEG reviewed Watch for any new seizures Currently will hold off on antiepileptic medication  -Dehydration Resolved with IV fluids  -Hypokalemia Replace potassium orally  -Sepsis Unsure of etiology Elevated lactic acid could be from dehydration versus seizure Currently on iv zosyn antibiotics Will narrow antibiotics based on cultures  -Metabolic acidosis improved with bicarbonate infusion  -Schizophrenia Continue haldol and Seroquel Continue amantadine for extra pyramidal symptoms  -DVT prophylaxis subcu Lovenox  All the records are reviewed and case discussed with Care Management/Social Worker. Management plans discussed with the patient, family and they are in agreement.  CODE STATUS: Full code  DVT Prophylaxis: SCDs  TOTAL TIME TAKING CARE OF THIS PATIENT: 35 minutes.   POSSIBLE D/C IN 2 to 3 DAYS, DEPENDING ON CLINICAL CONDITION.  Ihor AustinPavan Parag Dorton M.D on 04/07/2018 at 4:14 PM  Between 7am to 6pm - Pager - [phone]  After 6pm go to [email or web address] - password [email or web address] [facility]  [facility] [place] [facility]  Office  [phone]  CC: Primary care physician; Sherrie MustacheJadali, Fayegh, MD  (Inactive)  Note: This dictation was prepared with Dragon dictation along with smaller phrase technology. Any transcriptional errors that result from this process are unintentional.

## 2018-04-08 DIAGNOSIS — F203 Undifferentiated schizophrenia: Secondary | ICD-10-CM

## 2018-04-08 LAB — BASIC METABOLIC PANEL
ANION GAP: 8 (ref 5–15)
BUN: 13 mg/dL (ref 6–20)
CO2: 26 mmol/L (ref 22–32)
Calcium: 8.7 mg/dL — ABNORMAL LOW (ref 8.9–10.3)
Chloride: 109 mmol/L (ref 98–111)
Creatinine, Ser: 1.42 mg/dL — ABNORMAL HIGH (ref 0.61–1.24)
GFR calc Af Amer: >60 mL/min (ref >60)
GFR, EST NON AFRICAN AMERICAN: 53 mL/min — AB (ref >60)
Glucose, Bld: 80 mg/dL (ref 70–99)
POTASSIUM: 3.7 mmol/L (ref 3.5–5.1)
Sodium: 143 mmol/L (ref 135–145)

## 2018-04-08 LAB — HIV ANTIBODY (ROUTINE TESTING W REFLEX): HIV SCREEN 4TH GENERATION: NONREACTIVE

## 2018-04-08 MED ORDER — CIPROFLOXACIN HCL 500 MG PO TABS
500.0000 mg | ORAL_TABLET | Freq: Two times a day (BID) | ORAL | Status: DC
  Administered 2018-04-08 – 2018-04-09 (×2): 500 mg via ORAL
  Filled 2018-04-08 (×2): qty 1

## 2018-04-08 NOTE — Plan of Care (Signed)
  Problem: Education: Goal: Knowledge of General Education information will improve Outcome: Not Progressing  Physical Therapy Evaluation ordered in [facility] this shift d/t pt's drowsiness and not getting oob; pt from Group Home; d/c plans for 04/09/18

## 2018-04-08 NOTE — Consult Note (Signed)
[place] Psychiatry Consult   Reason for Consult: Consult for 57 year old man with schizophrenia who is in the hospital with altered mental status Referring Physician: Tressia Miners Patient Identification: Roberto Vasquez MRN:  [id number] Principal Diagnosis: Schizophrenia [facility]) Diagnosis:   Patient Active Problem List   Diagnosis Date Noted  . Seizure ([place]) [R56.9] 04/06/2018  . Tobacco use disorder [F17.200] 02/23/2017  . Schizophrenia ([place]) [F20.9] 02/20/2017  . Noncompliance [Z91.19] 02/20/2017  . HTN (hypertension) [I10] 03/01/2015    Total Time spent with patient: 1 hour  Subjective:   Roberto Vasquez is a 57 y.o. male patient admitted with "I think the thing needed to be done".  HPI: Patient interviewed chart reviewed.  57 year old man with a long history of schizophrenia brought from his group home with acute altered mental status and confusion.  Concern about the possibility of a seizure.  Patient has been seen by neurology and worked up and it seems to be doubtful that he had an actual seizure.  On interview the patient has no idea what brought him into the hospital.  He is disorganized and sometimes odd in his thinking.  Cannot even tell me where he lives or give me any idea of the circumstances that brought him in.  Denies any symptoms.  Denies being depressed or sad.  Denies wishes to die.  Denies any visual hallucinations but does admit that he hears voices regularly.  Social history: Lives in a group home long-term situation.  Medical history: Current altered mental status of unclear etiology past history of hypertension  Substance abuse history: Tobacco use but no usual alcohol or drug abuse no evidence of that this time either.  Past Psychiatric History: Long-term schizophrenia several prior hospitalizations last when here about a year ago.  Haldol has been a preferred medication for him and he has gotten both Haldol Decanoate and oral Haldol and has  remained relatively stable with that although his baseline is probably still low.  Risk to Self:   Risk to Others:   Prior Inpatient Therapy:   Prior Outpatient Therapy:    Past Medical History:  Past Medical History:  Diagnosis Date  . Anxiety   . Hepatitis C   . Hypertension   . Schizophrenia ([place])   . Seizures ([place])     Past Surgical History:  Procedure Laterality Date  . ABDOMINAL SURGERY    . VENTRAL HERNIA REPAIR N/A 10/20/2016   Procedure: HERNIA REPAIR VENTRAL ADULT;  Surgeon: Florene Glen, MD;  Location: [facility] ORS;  Service: General;  Laterality: N/A;   Family History:  Family History  Problem Relation Age of Onset  . Hypertension Mother    Family Psychiatric  History: None Social History:  Social History   Substance and Sexual Activity  Alcohol Use No     Social History   Substance and Sexual Activity  Drug Use No    Social History   Socioeconomic History  . Marital status: Single    Spouse name: Not on file  . Number of children: Not on file  . Years of education: Not on file  . Highest education level: Not on file  Occupational History  . Not on file  Social Needs  . Financial resource strain: Not on file  . Food insecurity:    Worry: Not on file    Inability: Not on file  . Transportation needs:    Medical: Not on file    Non-medical: Not on file  Tobacco Use  .  Smoking status: Current Every Day Smoker    Packs/day: 1.00    Years: 40.00    Pack years: 40.00    Types: Cigarettes  . Smokeless tobacco: Never Used  Substance and Sexual Activity  . Alcohol use: No  . Drug use: No  . Sexual activity: Not on file  Lifestyle  . Physical activity:    Days per week: Not on file    Minutes per session: Not on file  . Stress: Not on file  Relationships  . Social connections:    Talks on phone: Not on file    Gets together: Not on file    Attends religious service: Not on file    Active member of club or organization: Not on file     Attends meetings of clubs or organizations: Not on file    Relationship status: Not on file  Other Topics Concern  . Not on file  Social History Narrative   From a group home   Currently from a jail.   Ambulates independently at baseline   Additional Social History:    Allergies:  No Known Allergies  Labs:  Results for orders placed or performed during the hospital encounter of 04/06/18 (from the past 48 hour(s))  Basic metabolic panel     Status: Abnormal   Collection Time: 04/07/18  5:15 AM  Result Value Ref Range   Sodium 143 135 - 145 mmol/L   Potassium 3.3 (L) 3.5 - 5.1 mmol/L   Chloride 107 98 - 111 mmol/L    Comment: Please note change in reference range.   CO2 29 22 - 32 mmol/L   Glucose, Bld 93 70 - 99 mg/dL    Comment: Please note change in reference range.   BUN 15 6 - 20 mg/dL    Comment: Please note change in reference range.   Creatinine, Ser 1.70 (H) 0.61 - 1.24 mg/dL   Calcium 8.6 (L) 8.9 - 10.3 mg/dL   GFR calc non Af Amer 42 (L) >60 mL/min   GFR calc Af Amer 49 (L) >60 mL/min    Comment: (NOTE) The eGFR has been calculated using the CKD EPI equation. This calculation has not been validated in all clinical situations. eGFR's persistently <60 mL/min signify possible Chronic Kidney Disease.    Anion gap 7 5 - 15    Comment: Performed at [facility], [address] [place], [place] [address]  CBC     Status: Abnormal   Collection Time: 04/07/18  5:15 AM  Result Value Ref Range   WBC 7.1 3.8 - 10.6 K/uL   RBC 3.88 (L) 4.40 - 5.90 MIL/uL   Hemoglobin 12.8 (L) 13.0 - 18.0 g/dL   HCT 36.7 (L) 40.0 - 52.0 %   MCV 94.7 80.0 - 100.0 fL   MCH 33.0 26.0 - 34.0 pg   MCHC 34.9 32.0 - 36.0 g/dL   RDW 11.8 11.5 - 14.5 %   Platelets 170 150 - 440 K/uL    Comment: Performed at [facility], [address] [place], [place] [address]  HIV antibody     Status: None   Collection Time: 04/07/18  5:15 AM  Result Value Ref Range   HIV Screen 4th  Generation wRfx Non Reactive Non Reactive    Comment: (NOTE) Performed At: [facility] [place], [place] [id number] Rush Farmer MD DX:[id number]   Basic metabolic panel     Status: Abnormal   Collection Time: 04/08/18  2:52  AM  Result Value Ref Range   Sodium 143 135 - 145 mmol/L   Potassium 3.7 3.5 - 5.1 mmol/L   Chloride 109 98 - 111 mmol/L    Comment: Please note change in reference range.   CO2 26 22 - 32 mmol/L   Glucose, Bld 80 70 - 99 mg/dL    Comment: Please note change in reference range.   BUN 13 6 - 20 mg/dL    Comment: Please note change in reference range.   Creatinine, Ser 1.42 (H) 0.61 - 1.24 mg/dL   Calcium 8.7 (L) 8.9 - 10.3 mg/dL   GFR calc non Af Amer 53 (L) >60 mL/min   GFR calc Af Amer >60 >60 mL/min    Comment: (NOTE) The eGFR has been calculated using the CKD EPI equation. This calculation has not been validated in all clinical situations. eGFR's persistently <60 mL/min signify possible Chronic Kidney Disease.    Anion gap 8 5 - 15    Comment: Performed at [facility], [address] [place], [place] [address]    Current Facility-Administered Medications  Medication Dose Route Frequency Provider Last Rate Last Dose  . acetaminophen (TYLENOL) tablet 650 mg  650 mg Oral Q6H PRN Gladstone Lighter, MD       Or  . acetaminophen (TYLENOL) suppository 650 mg  650 mg Rectal Q6H PRN Gladstone Lighter, MD      . amantadine (SYMMETREL) capsule 100 mg  100 mg Oral BID Gladstone Lighter, MD   100 mg at 04/08/18 1010  . ciprofloxacin (CIPRO) tablet 500 mg  500 mg Oral BID Pyreddy, Reatha Harps, MD      . enoxaparin (LOVENOX) injection 40 mg  40 mg Subcutaneous Q24H Gladstone Lighter, MD   40 mg at 04/07/18 2230  . haloperidol (HALDOL) tablet 10 mg  10 mg Oral BID Gladstone Lighter, MD   10 mg at 04/08/18 1010  . [START ON 04/21/2018] haloperidol decanoate (HALDOL DECANOATE) 100 MG/ML injection 100 mg  100 mg Intramuscular Q30 days  Gladstone Lighter, MD      . levETIRAcetam (KEPPRA) tablet 250 mg  250 mg Oral BID Alexis Goodell, MD   250 mg at 04/08/18 1010  . LORazepam (ATIVAN) injection 2 mg  2 mg Intravenous Q4H PRN Gladstone Lighter, MD   2 mg at 04/06/18 1543  . ondansetron (ZOFRAN) tablet 4 mg  4 mg Oral Q6H PRN Gladstone Lighter, MD       Or  . ondansetron (ZOFRAN) injection 4 mg  4 mg Intravenous Q6H PRN Gladstone Lighter, MD      . QUEtiapine (SEROQUEL) tablet 100 mg  100 mg Oral QHS Saundra Shelling, MD   100 mg at 04/07/18 2229  . traZODone (DESYREL) tablet 100 mg  100 mg Oral QHS Gladstone Lighter, MD   100 mg at 04/07/18 2229    Musculoskeletal: Strength & Muscle Tone: within normal limits Gait & Station: normal Patient leans: N/A  Psychiatric Specialty Exam: Physical Exam  Nursing note and vitals reviewed. Constitutional: He appears well-developed and well-nourished.  HENT:  Head: Normocephalic and atraumatic.  Eyes: Pupils are equal, round, and reactive to light. Conjunctivae are normal.  Neck: Normal range of motion.  Cardiovascular: Normal heart sounds.  Respiratory: Effort normal.  GI: Soft.  Musculoskeletal: Normal range of motion.  Neurological: He is alert.  Skin: Skin is warm and dry.  Psychiatric: His affect is blunt. His speech is delayed. He is slowed. Thought content is delusional. Cognition and memory are  impaired. He expresses inappropriate judgment. He expresses no homicidal and no suicidal ideation.    Review of Systems  Constitutional: Negative.   HENT: Negative.   Eyes: Negative.   Respiratory: Negative.   Cardiovascular: Negative.   Gastrointestinal: Negative.   Musculoskeletal: Negative.   Skin: Negative.   Neurological: Negative.   Psychiatric/Behavioral: Negative.     Blood pressure (!) 133/92, pulse (!) 41, temperature 98.2 F (36.8 C), temperature source Oral, resp. rate 16, height 6' (1.829 m), weight 74.6 kg (164 lb 6.4 oz), SpO2 100 %.Body mass index is  22.3 kg/m.  General Appearance: Fairly Groomed  Eye Contact:  Fair  Speech:  Slow  Volume:  Decreased  Mood:  Euthymic  Affect:  Constricted  Thought Process:  Disorganized  Orientation:  Negative  Thought Content:  Illogical  Suicidal Thoughts:  No  Homicidal Thoughts:  No  Memory:  Immediate;   Fair Recent;   Poor Remote;   Poor  Judgement:  Poor  Insight:  Lacking  Psychomotor Activity:  Decreased  Concentration:  Concentration: Poor  Recall:  Poor  Fund of Knowledge:  Poor  Language:  Poor  Akathisia:  No  Handed:  Right  AIMS (if indicated):     Assets:  Housing  ADL's:  Impaired  Cognition:  Impaired,  Moderate  Sleep:        Treatment Plan Summary: Daily contact with patient to assess and evaluate symptoms and progress in treatment, Medication management and Plan This is a patient with schizophrenia who had altered mental status possibly seizure but also just is likely related to having been off of his medicine or some brief medical problem.  Right now he seems to be returning to his baseline.  I see that orders had already been placed about restarting his psychiatric medicines which is just what I would have recommended.  No need for inpatient hospitalization.  Patient has no capacity to make medical decisions for himself.  I am not sure if he has a guardian but if he does not he certainly should as he is really not able to make any coherent discussion of his medical issues.  I will continue to follow up while he is in the hospital.  Disposition: No evidence of imminent risk to self or others at present.   Patient does not meet criteria for psychiatric inpatient admission. Supportive therapy provided about ongoing stressors.  Alethia Berthold, MD 04/08/2018 6:31 PM

## 2018-04-08 NOTE — Progress Notes (Addendum)
[facility] - [place] [facility]   PATIENT NAME: Llewellyn Choplin    MR#:  [id number]  DATE OF BIRTH:  03-21-1961  SUBJECTIVE:  CHIEF COMPLAINT:   Chief Complaint  Patient presents with  . Loss of Consciousness  Patient seen and evaluated today  is awake and responds to verbal commands Not completely oriented Patient is off one-on-one observation which was initially kept for safety Tolerating diet well No fevers  REVIEW OF SYSTEMS:    ROS  CONSTITUTIONAL: No documented fever. No fatigue, weakness. No weight gain, no weight loss.  EYES: No blurry or double vision.  ENT: No tinnitus. No postnasal drip. No redness of the oropharynx.  RESPIRATORY: No cough, no wheeze, no hemoptysis. No dyspnea.  CARDIOVASCULAR: No chest pain. No orthopnea. No palpitations. No syncope.  GASTROINTESTINAL: No nausea, no vomiting or diarrhea. No abdominal pain. No melena or hematochezia.  GENITOURINARY: No dysuria or hematuria.  ENDOCRINE: No polyuria or nocturia. No heat or cold intolerance.  HEMATOLOGY: No anemia. No bruising. No bleeding.  INTEGUMENTARY: No rashes. No lesions.  MUSCULOSKELETAL: No arthritis. No swelling. No gout.  NEUROLOGIC: No numbness, tingling, or ataxia. No seizure-type activity.  PSYCHIATRIC: No anxiety. No insomnia. No ADD.   DRUG ALLERGIES:  No Known Allergies  VITALS:  Blood pressure 123/82, pulse (!) 48, temperature 98.2 F (36.8 C), temperature source Oral, resp. rate 15, height 6' (1.829 m), weight 74.6 kg (164 lb 6.4 oz), SpO2 100 %.  PHYSICAL EXAMINATION:   Physical Exam  GENERAL:  57 y.o.-year-old patient lying in the bed with no acute distress.  EYES: Pupils equal, round, reactive to light and accommodation. No scleral icterus. Extraocular muscles intact.  HEENT: Head atraumatic, normocephalic. Oropharynx and nasopharynx clear.  NECK:  Supple, no jugular venous distention. No thyroid enlargement, no tenderness.  LUNGS: Normal breath  sounds bilaterally, no wheezing, rales, rhonchi. No use of accessory muscles of respiration.  CARDIOVASCULAR: S1, S2 normal. No murmurs, rubs, or gallops.  ABDOMEN: Soft, nontender, nondistended. Bowel sounds present. No organomegaly or mass.  EXTREMITIES: No cyanosis, clubbing or edema b/l.    NEUROLOGIC: Cranial nerves II through XII are intact. No focal Motor or sensory deficits b/l.   PSYCHIATRIC: The patient is alert and oriented x 1 SKIN: No obvious rash, lesion, or ulcer.   LABORATORY PANEL:   CBC Recent Labs  Lab 04/07/18 0515  WBC 7.1  HGB 12.8*  HCT 36.7*  PLT 170   ------------------------------------------------------------------------------------------------------------------ Chemistries  Recent Labs  Lab 04/06/18 0509  04/08/18 0252  NA 143   < > 143  K 3.5   < > 3.7  CL 105   < > 109  CO2 13*   < > 26  GLUCOSE 180*   < > 80  BUN 21*   < > 13  CREATININE 1.79*   < > 1.42*  CALCIUM 9.3   < > 8.7*  AST 60*  --   --   ALT 22  --   --   ALKPHOS 61  --   --   BILITOT 1.4*  --   --    < > = values in this interval not displayed.   ------------------------------------------------------------------------------------------------------------------  Cardiac Enzymes Recent Labs  Lab 04/06/18 0509  TROPONINI <0.03   ------------------------------------------------------------------------------------------------------------------  RADIOLOGY:  No results found.   ASSESSMENT AND PLAN:  57 year old male patient with history of schizophrenia, hypertension was admitted to hospitalist service for change in mental status  -Acute encephalopathy probably metabolic in  etiology secondary to dehydration Improved with IV fluids Questionable seizure Status post neurology evaluation EEG reviewed Watch for any new seizures Currently on oral Keppra  -Dehydration Resolved with IV fluids  -Hypokalemia Replace replaced potassium orally  Elevated lactic acid could be  from dehydration versus seizure Sepsis unlikely Currently on oral ciprofloxacin antibiotic Of urine culture  -Metabolic acidosis resolved  -Schizophrenia Continue haldol and Seroquel Continue amantadine for extra pyramidal symptoms Psychiatry consult for adjusting meds  -DVT prophylaxis subcu Lovenox  -Disposition  patient is from group home Will discuss with social worker  -Physical therapy evaluation   All the records are reviewed and case discussed with Care Management/Social Worker. Management plans discussed with the patient, family and they are in agreement.  CODE STATUS: Full code  DVT Prophylaxis: SCDs  TOTAL TIME TAKING CARE OF THIS PATIENT: 33 minutes.   POSSIBLE D/C IN 2 to 3 DAYS, DEPENDING ON CLINICAL CONDITION.  Ihor AustinPavan Gareld Obrecht M.D on 04/08/2018 at 1:18 PM  Between 7am to 6pm - Pager - [phone]  After 6pm go to [email or web address] - password [email or web address] [facility]  [facility] [place] [facility]  Office  [phone]  CC: Primary care physician; Sherrie MustacheJadali, Fayegh, MD (Inactive)  Note: This dictation was prepared with Dragon dictation along with smaller phrase technology. Any transcriptional errors that result from this process are unintentional.

## 2018-04-09 MED ORDER — CIPROFLOXACIN HCL 250 MG PO TABS: 250.0000 mg | ORAL_TABLET | Freq: Two times a day (BID) | ORAL | 0 refills | Status: AC

## 2018-04-09 NOTE — Care Management Important Message (Signed)
Important Message  Patient Details  Name: Roberto JosephJerome Walter Wirtanen MRN: [id number] Date of Birth: August 30, 1961   Medicare Important Message Given:  Yes    Olegario MessierKathy A Larsen Zettel 04/09/2018, 11:34 AM

## 2018-04-09 NOTE — Plan of Care (Signed)
  Problem: Education: Goal: Knowledge of General Education information will improve Outcome: Progressing   Problem: Education: Goal: Expressions of having a comfortable level of knowledge regarding the disease process will increase Outcome: Progressing   Problem: Safety: Goal: Verbalization of understanding the information provided will improve Outcome: Progressing   Problem: Fluid Volume: Goal: Hemodynamic stability will improve Outcome: Progressing   Problem: Clinical Measurements: Goal: Diagnostic test results will improve Outcome: Progressing Goal: Signs and symptoms of infection will decrease Outcome: Progressing   Problem: Respiratory: Goal: Ability to maintain adequate ventilation will improve Outcome: Progressing   Problem: Education: Goal: Knowledge of General Education information will improve Outcome: Progressing   Problem: Education: Goal: Expressions of having a comfortable level of knowledge regarding the disease process will increase Outcome: Progressing   Problem: Safety: Goal: Verbalization of understanding the information provided will improve Outcome: Progressing   Problem: Fluid Volume: Goal: Hemodynamic stability will improve Outcome: Progressing   Problem: Clinical Measurements: Goal: Diagnostic test results will improve Outcome: Progressing Goal: Signs and symptoms of infection will decrease Outcome: Progressing   Problem: Respiratory: Goal: Ability to maintain adequate ventilation will improve Outcome: Progressing

## 2018-04-09 NOTE — Evaluation (Signed)
Physical Therapy Evaluation Patient Details Name: Roberto Vasquez MRN: [id number] DOB: 31-Dec-1960 Today's Date: 04/09/2018   History of Present Illness  Pt is a 57 y.o. male presenting to hospital 04/06/18 after being found unresponsive in [facility] cell.  Per notes, pt with h/o multiple psychiatric hospitalizations for schizophrenia and aggressive behavior.  Pt admitted with AMS, sepsis, metabolic acidosis, and schizophrenia.  PMH includes h/o seizures, schizophrenia, recent dx UTI 03/31/18, Hep C, htn, ventral hernia repair.  Clinical Impression  Prior to hospital admission, pt reports being independent with ambulation.  Pt lives at a group home.  Currently pt is modified independent supine to sit; independent with transfers; and CGA ambulating 2x's around nursing loop (no assistive device).  Pt initially mildly unsteady (no loss of balance requiring assist to steady though); improved balance noted with increased distance ambulating.  Overall pt demonstrating generalized weakness.  Pt would benefit from skilled PT to address noted impairments and functional limitations during hospital stay (see below for any additional details).  No further PT needs anticipated upon hospital discharge.    Follow Up Recommendations No PT follow up    Equipment Recommendations  None recommended by PT    Recommendations for Other Services       Precautions / Restrictions Precautions Precautions: Fall Precaution Comments: Seizure precautions Restrictions Weight Bearing Restrictions: No      Mobility  Bed Mobility Overal bed mobility: Modified Independent             General bed mobility comments: Supine to sit with HOB elevated without any noted difficulty  Transfers Overall transfer level: Independent Equipment used: None             General transfer comment: steady transfers no AD  Ambulation/Gait Ambulation/Gait assistance: Min guard Gait Distance (Feet): 350  Feet Assistive device: None Gait Pattern/deviations: Step-through pattern Gait velocity: mildly decreased   General Gait Details: initially mildly unsteady (no loss of balance noted requiring assist for balance though) but improved with increased distance ambulating  Stairs            Wheelchair Mobility    Modified Rankin (Stroke Patients Only)       Balance Overall balance assessment: Needs assistance Sitting-balance support: No upper extremity supported;Feet supported Sitting balance-Leahy Scale: Normal Sitting balance - Comments: steady sitting reaching outside BOS   Standing balance support: No upper extremity supported Standing balance-Leahy Scale: Good Standing balance comment: steady standing reaching within BOS                             Pertinent Vitals/Pain Pain Assessment: No/denies pain  Vitals (HR and O2 on room air) stable and WFL throughout treatment session.    Home Living Family/patient expects to be discharged to:: Group home                 Additional Comments: [facility]    Prior Function Level of Independence: Independent         Comments: Per recent ED note pt was ambulating with steady gait      Hand Dominance        Extremity/Trunk Assessment   Upper Extremity Assessment Upper Extremity Assessment: Generalized weakness    Lower Extremity Assessment Lower Extremity Assessment: Generalized weakness    Cervical / Trunk Assessment Cervical / Trunk Assessment: Normal  Communication   Communication: No difficulties  Cognition Arousal/Alertness: Awake/alert Behavior During Therapy: Flat affect Overall Cognitive  Status: No family/caregiver present to determine baseline cognitive functioning(Oriented to person and place)                                        General Comments   Nursing cleared pt for participation in physical therapy.  Pt agreeable to PT session.    Exercises      Assessment/Plan    PT Assessment Patient needs continued PT services  PT Problem List Decreased strength;Decreased balance;Decreased mobility       PT Treatment Interventions DME instruction;Gait training;Stair training;Functional mobility training;Therapeutic activities;Therapeutic exercise;Balance training;Patient/family education    PT Goals (Current goals can be found in the Care Plan section)  Acute Rehab PT Goals Patient Stated Goal: to walk more PT Goal Formulation: With patient Time For Goal Achievement: 04/23/18 Potential to Achieve Goals: Good    Frequency Min 2X/week   Barriers to discharge        Co-evaluation               AM-PAC PT "6 Clicks" Daily Activity  Outcome Measure Difficulty turning over in bed (including adjusting bedclothes, sheets and blankets)?: None Difficulty moving from lying on back to sitting on the side of the bed? : None Difficulty sitting down on and standing up from a chair with arms (e.g., wheelchair, bedside commode, etc,.)?: None Help needed moving to and from a bed to chair (including a wheelchair)?: None Help needed walking in hospital room?: A Little Help needed climbing 3-5 steps with a railing? : A Little 6 Click Score: 22    End of Session Equipment Utilized During Treatment: Gait belt Activity Tolerance: Patient tolerated treatment well Patient left: in chair;with call bell/phone within reach;with chair alarm set Nurse Communication: Mobility status;Precautions PT Visit Diagnosis: Unsteadiness on feet (R26.81);Muscle weakness (generalized) (M62.81);Other abnormalities of gait and mobility (R26.89)    Time: 4098-1191 PT Time Calculation (min) (ACUTE ONLY): 17 min   Charges:   PT Evaluation $PT Eval Low Complexity: 1 Low PT Treatments $Therapeutic Exercise: 8-22 mins   PT G CodesHendricks Limes, PT 04/09/18, 10:36 AM [phone]

## 2018-04-09 NOTE — Discharge Summary (Signed)
[facility] - Between at [facility]   PATIENT NAME: Roberto Vasquez    MR#:  [id number]  DATE OF BIRTH:  [birth year]  DATE OF ADMISSION:  04/06/2018 ADMITTING PHYSICIAN: Enid Baas, MD  DATE OF DISCHARGE: 04/09/2018  PRIMARY CARE PHYSICIAN: Sherrie Mustache, MD (Inactive)   ADMISSION DIAGNOSIS:  Sepsis, due to unspecified organism (HCC) [A41.9] Altered mental status, unspecified altered mental status type [R41.82] Dehydration Schizophrenia metabolic encephalopathy Acute kidney injury DISCHARGE DIAGNOSIS:  Metabolic encephalopathy Dehydration Elevated lactic acid secondary to dehydration Acute hypokalemia   schizophrenia  History of urinary tract infection Acute kidney injury   SECONDARY DIAGNOSIS:   Past Medical History:  Diagnosis Date  . Anxiety   . Hepatitis C   . Hypertension   . Schizophrenia (HCC)   . Seizures (HCC)      ADMITTING HISTORY Roberto Vasquez  is a 57 y.o. male with a known history of schizophrenia, hypertension not on any medications, seizure disorder who is from a group home is brought in currently from a presents secondary to altered mental status.Patient is alert but unable to provide any history at this time.  Very disoriented and confused.  Most of the history is obtained from looking at his old records.  Patient has had multiple psychiatric hospitalizations for his schizophrenia and aggressive behavior, most recent one was here in June 2018.  He was in the emergency room from a group home last week for UTI and was discharged on Keflex.  The very next day on 04/01/2018 he was incarcerated for unknown reasons.  Not sure if he continued his antibiotic while in the present or not.  There was an episode where he had to be moved to a different cell due to fecal incontinence.  No seizure episode was reported at the time.  Last night he was noted to be unresponsive in the cell and is brought to the hospital.  No tongue bite noted.  No urinary  incontinence reported.  Seizure disorder has been mentioned in his past medical history, but never been on any medications based on his previous medication list.  No history of alcohol or drugs reported.  CT of the head is negative.  Labs indicate acute renal failure with lactic acidosis and he is being admitted for the same.    HOSPITAL COURSE:  Patient was admitted to medical floor.  Was started on IV fluids.  Patient received IV bicarbonate infusion for metabolic acidosis.  Initially lactic acid was elevated sepsis was thought off but cultures did not reveal any growth.  Patient was also evaluated for questionable seizure by neurology and was started on a trial dose of Keppra.  He was worked up with CT head which showed no acute abnormality he also had a EEG done during the work-up.. Patient's mental status improved with IV fluid hydration after his metabolic acidosis resolved and his dehydration resolved.  Initially patient was started on broad-spectrum antibiotics IV vancomycin and Zosyn which were later on narrowed down to ciprofloxacin antibiotic.  Patient tolerated diet well.  His Keppra has been stopped as no new episodes of seizures were noted in the hospital.  Patient continued his home medications for schizophrenia and was evaluated by psychiatry attending during the hospitalization who recommended to continue the medications.  Blood cultures revealed coagulase-negative staph aureus which is a contaminant.  Patient had Citrobacter koseri urinary tract infection in the past and his antibiotics were narrowed down to ciprofloxacin based on culture and sensitivity.  Patient hemodynamically stable  will be discharged back to group home.   CONSULTS OBTAINED:  Treatment Team:  Thana Farreynolds, Leslie, MD Clapacs, Jackquline DenmarkJohn T, MD  DRUG ALLERGIES:  No Known Allergies  DISCHARGE MEDICATIONS:   Allergies as of 04/09/2018   No Known Allergies     Medication List    STOP taking these medications    amantadine 100 MG capsule Commonly known as:  SYMMETREL   cephALEXin 500 MG capsule Commonly known as:  KEFLEX   haloperidol 10 MG tablet Commonly known as:  HALDOL   haloperidol decanoate 100 MG/ML injection Commonly known as:  HALDOL DECANOATE     TAKE these medications   ABILIFY MAINTENA 300 MG Prsy prefilled syringe Generic drug:  ARIPiprazole ER Inject 300 mg into the muscle every 28 (twenty-eight) days.   amLODipine 10 MG tablet Commonly known as:  NORVASC Take 10 mg by mouth daily.   ciprofloxacin 250 MG tablet Commonly known as:  CIPRO Take 1 tablet (250 mg total) by mouth 2 (two) times daily for 3 days.   hydrochlorothiazide 12.5 MG tablet Commonly known as:  HYDRODIURIL Take 12.5 mg by mouth daily.   lisinopril 20 MG tablet Commonly known as:  PRINIVIL,ZESTRIL Take 20 mg by mouth daily.   QUEtiapine 100 MG tablet Commonly known as:  SEROQUEL Take 100 mg by mouth at bedtime.   traZODone 100 MG tablet Commonly known as:  DESYREL Take 1 tablet (100 mg total) by mouth at bedtime.       Today  patient seen and evaluated on the day of discharge Tolerating diet well No fever No shortness of breath   VITAL SIGNS:  Blood pressure 123/82, pulse (!) 47, temperature 98.3 F (36.8 C), temperature source Oral, resp. rate 18, height 6' (1.829 m), weight 74.6 kg (164 lb 6.4 oz), SpO2 98 %.  I/O:    Intake/Output Summary (Last 24 hours) at 04/09/2018 1158 Last data filed at 04/09/2018 0700 Gross per 24 hour  Intake 0 ml  Output 950 ml  Net -950 ml    PHYSICAL EXAMINATION:  Physical Exam  GENERAL:  57 y.o.-year-old patient lying in the bed with no acute distress.  LUNGS: Normal breath sounds bilaterally, no wheezing, rales,rhonchi or crepitation. No use of accessory muscles of respiration.  CARDIOVASCULAR: S1, S2 normal. No murmurs, rubs, or gallops.  ABDOMEN: Soft, non-tender, non-distended. Bowel sounds present. No organomegaly or mass.  NEUROLOGIC:  Moves all 4 extremities. PSYCHIATRIC: The patient is alert and oriented x 3.  SKIN: No obvious rash, lesion, or ulcer.   DATA REVIEW:   CBC Recent Labs  Lab 04/07/18 0515  WBC 7.1  HGB 12.8*  HCT 36.7*  PLT 170    Chemistries  Recent Labs  Lab 04/06/18 0509  04/08/18 0252  NA 143   < > 143  K 3.5   < > 3.7  CL 105   < > 109  CO2 13*   < > 26  GLUCOSE 180*   < > 80  BUN 21*   < > 13  CREATININE 1.79*   < > 1.42*  CALCIUM 9.3   < > 8.7*  AST 60*  --   --   ALT 22  --   --   ALKPHOS 61  --   --   BILITOT 1.4*  --   --    < > = values in this interval not displayed.    Cardiac Enzymes Recent Labs  Lab 04/06/18 0509  TROPONINI <0.03  Microbiology Results  Results for orders placed or performed during the hospital encounter of 04/06/18  Blood Culture (routine x 2)     Status: None (Preliminary result)   Collection Time: 04/06/18  5:18 AM  Result Value Ref Range Status   Specimen Description BLOOD RIGHT FATTY CASTS  Final   Special Requests   Final    BOTTLES DRAWN AEROBIC AND ANAEROBIC Blood Culture adequate volume   Culture   Final    NO GROWTH 3 DAYS Performed at [facility], [address], [place], [place] [address]    Report Status PENDING  Incomplete  Blood Culture (routine x 2)     Status: Abnormal   Collection Time: 04/06/18  5:18 AM  Result Value Ref Range Status   Specimen Description   Final    BLOOD BLOOD LEFT FOREARM Performed at [facility], [address], [place], [place] [address]    Special Requests   Final    BOTTLES DRAWN AEROBIC AND ANAEROBIC Blood Culture adequate volume Performed at [facility], [address] [place], [place] [address]    Culture  Setup Time   Final    GRAM POSITIVE COCCI AEROBIC BOTTLE ONLY CRITICAL RESULT CALLED TO, READ BACK BY AND VERIFIED WITH: DAVID BESANTI ON 04/06/18 AT 2328 JAG    Culture (A)  Final    STAPHYLOCOCCUS SPECIES (COAGULASE NEGATIVE) THE SIGNIFICANCE OF  ISOLATING THIS ORGANISM FROM A SINGLE SET OF BLOOD CULTURES WHEN MULTIPLE SETS ARE DRAWN IS UNCERTAIN. PLEASE NOTIFY THE MICROBIOLOGY DEPARTMENT WITHIN ONE WEEK IF SPECIATION AND SENSITIVITIES ARE REQUIRED. Performed at [facility], [address] [place], [place] [address]    Report Status 04/09/2018 FINAL  Final  Blood Culture ID Panel (Reflexed)     Status: Abnormal   Collection Time: 04/06/18  5:18 AM  Result Value Ref Range Status   Enterococcus species NOT DETECTED NOT DETECTED Final   Listeria monocytogenes NOT DETECTED NOT DETECTED Final   Staphylococcus species DETECTED (A) NOT DETECTED Final    Comment: Methicillin (oxacillin) resistant coagulase negative staphylococcus. Possible blood culture contaminant (unless isolated from more than one blood culture draw or clinical case suggests pathogenicity). No antibiotic treatment is indicated for blood  culture contaminants. CRITICAL RESULT CALLED TO, READ BACK BY AND VERIFIED WITH: DAVID BESANTI ON 04/06/18 AT 2328 BY JAG    Staphylococcus aureus NOT DETECTED NOT DETECTED Final   Methicillin resistance DETECTED (A) NOT DETECTED Final    Comment: CRITICAL RESULT CALLED TO, READ BACK BY AND VERIFIED WITH: DAVID BESANTI ON 04/06/18 AT 2328 BY JAG    Streptococcus species NOT DETECTED NOT DETECTED Final   Streptococcus agalactiae NOT DETECTED NOT DETECTED Final   Streptococcus pneumoniae NOT DETECTED NOT DETECTED Final   Streptococcus pyogenes NOT DETECTED NOT DETECTED Final   Acinetobacter baumannii NOT DETECTED NOT DETECTED Final   Enterobacteriaceae species NOT DETECTED NOT DETECTED Final   Enterobacter cloacae complex NOT DETECTED NOT DETECTED Final   Escherichia coli NOT DETECTED NOT DETECTED Final   Klebsiella oxytoca NOT DETECTED NOT DETECTED Final   Klebsiella pneumoniae NOT DETECTED NOT DETECTED Final   Proteus species NOT DETECTED NOT DETECTED Final   Serratia marcescens NOT DETECTED NOT DETECTED Final   Haemophilus  influenzae NOT DETECTED NOT DETECTED Final   Neisseria meningitidis NOT DETECTED NOT DETECTED Final   Pseudomonas aeruginosa NOT DETECTED NOT DETECTED Final   Candida albicans NOT DETECTED NOT DETECTED Final   Candida glabrata NOT DETECTED  NOT DETECTED Final   Candida krusei NOT DETECTED NOT DETECTED Final   Candida parapsilosis NOT DETECTED NOT DETECTED Final   Candida tropicalis NOT DETECTED NOT DETECTED Final    Comment: Performed at [facility], [address], [place], [place] [address]  Urine culture     Status: None   Collection Time: 04/06/18  7:15 AM  Result Value Ref Range Status   Specimen Description   Final    URINE, RANDOM Performed at [facility], [address] [place], [place] [address]    Special Requests   Final    NONE Performed at [facility], [address], [place], [place] [address]    Culture   Final    NO GROWTH Performed at [facility], [address] [place], [place] [address]    Report Status 04/07/2018 FINAL  Final  MRSA PCR Screening     Status: None   Collection Time: 04/06/18 10:19 AM  Result Value Ref Range Status   MRSA by PCR NEGATIVE NEGATIVE Final    Comment:        The GeneXpert MRSA Assay (FDA approved for NASAL specimens only), is one component of a comprehensive MRSA colonization surveillance program. It is not intended to diagnose MRSA infection nor to guide or monitor treatment for MRSA infections. Performed at [facility], [address], [place], [place] [address]     RADIOLOGY:  No results found.  Follow up with PCP in 1 week.  Management plans discussed with the patient, family and they are in agreement.  CODE STATUS: Full code    Code Status Orders  (From admission, onward)        Start     Ordered   04/06/18 0937  Full code  Continuous     04/06/18 0936    Code Status History    Date Active Date Inactive Code Status Order ID Comments User Context    02/21/2017 1457 02/26/2017 1727 Full Code [id number]  Audery Amel, MD Inpatient   02/21/2017 1455 02/21/2017 1457 Full Code [id number]  Audery Amel, MD Inpatient   10/20/2016 0118 10/21/2016 1827 Full Code [id number]  Henrene Dodge, MD ED   03/01/2015 2314 03/03/2015 1547 Full Code [id number]  Crissie Figures, MD Inpatient      TOTAL TIME TAKING CARE OF THIS PATIENT ON DAY OF DISCHARGE: more than 35 minutes.   Ihor Austin M.D on 04/09/2018 at 11:58 AM  Between 7am to 6pm - Pager - [phone]  After 6pm go to [email or web address] - password [email or web address] [facility]  [facility]  Office  [phone]  CC: Primary care physician; Sherrie Mustache, MD (Inactive)  Note: This dictation was prepared with Dragon dictation along with smaller phrase technology. Any transcriptional errors that result from this process are unintentional.

## 2018-04-09 NOTE — Progress Notes (Signed)
Subjective: Patient out of bed in chair.  Cooperative.  Objective: Current vital signs: BP 123/82 (BP Location: Right Arm)   Pulse (!) 47   Temp 98.3 F (36.8 C) (Oral)   Resp 18   Ht 6' (1.829 m)   Wt 74.6 kg (164 lb 6.4 oz)   SpO2 98%   BMI 22.30 kg/m  Vital signs in last 24 hours: Temp:  [97.8 F (36.6 C)-98.3 F (36.8 C)] 98.3 F (36.8 C) (07/19 0411) Pulse Rate:  [41-47] 47 (07/19 0411) Resp:  [16-18] 18 (07/19 0411) BP: (123-141)/(82-92) 123/82 (07/19 0411) SpO2:  [98 %] 98 % (07/19 0411)  Intake/Output from previous day: 07/18 0701 - 07/19 0700 In: 50 [IV Piggyback:50] Out: 950 [Urine:950] Intake/Output this shift: No intake/output data recorded. Nutritional status:  Diet Order           Diet regular Room service appropriate? Yes; Fluid consistency: Thin  Diet effective now          Neurologic Exam: Mental Status: Alert and cooperative.  Speech fluent without evidence of aphasia.  Able to follow 3 step commands without difficulty. Cranial Nerves: II: Discs flat bilaterally; Visual fields grossly normal, pupils equal, round, reactive to light and accommodation III,IV, VI: ptosis not present, extra-ocular motions intact bilaterally V,VII: smile symmetric, facial light touch sensation normal bilaterally VIII: hearing normal bilaterally IX,X: gag reflex present XI: bilateral shoulder shrug XII: midline tongue extension Motor: 5/5 throughout   Lab Results: Basic Metabolic Panel: Recent Labs  Lab 04/06/18 0509 04/07/18 0515 04/08/18 0252  NA 143 143 143  K 3.5 3.3* 3.7  CL 105 107 109  CO2 13* 29 26  GLUCOSE 180* 93 80  BUN 21* 15 13  CREATININE 1.79* 1.70* 1.42*  CALCIUM 9.3 8.6* 8.7*    Liver Function Tests: Recent Labs  Lab 04/06/18 0509  AST 60*  ALT 22  ALKPHOS 61  BILITOT 1.4*  PROT 8.3*  ALBUMIN 4.4   No results for input(s): LIPASE, AMYLASE in the last 168 hours. No results for input(s): AMMONIA in the last 168  hours.  CBC: Recent Labs  Lab 04/06/18 0509 04/07/18 0515  WBC 9.3 7.1  HGB 14.7 12.8*  HCT 43.4 36.7*  MCV 98.3 94.7  PLT 223 170    Cardiac Enzymes: Recent Labs  Lab 04/06/18 0509  CKTOTAL 532*  TROPONINI <0.03    Lipid Panel: No results for input(s): CHOL, TRIG, HDL, CHOLHDL, VLDL, LDLCALC in the last 168 hours.  CBG: Recent Labs  Lab 04/06/18 0511  GLUCAP 157*    Microbiology: Results for orders placed or performed during the hospital encounter of 04/06/18  Blood Culture (routine x 2)     Status: None (Preliminary result)   Collection Time: 04/06/18  5:18 AM  Result Value Ref Range Status   Specimen Description BLOOD RIGHT FATTY CASTS  Final   Special Requests   Final    BOTTLES DRAWN AEROBIC AND ANAEROBIC Blood Culture adequate volume   Culture   Final    NO GROWTH 3 DAYS Performed at [facility], [address], [place], [place] [address]    Report Status PENDING  Incomplete  Blood Culture (routine x 2)     Status: Abnormal   Collection Time: 04/06/18  5:18 AM  Result Value Ref Range Status   Specimen Description   Final    BLOOD BLOOD LEFT FOREARM Performed at [facility], [address] [place], [place] [address]    Special  Requests   Final    BOTTLES DRAWN AEROBIC AND ANAEROBIC Blood Culture adequate volume Performed at [facility], [address] [place], [place] [address]    Culture  Setup Time   Final    GRAM POSITIVE COCCI AEROBIC BOTTLE ONLY CRITICAL RESULT CALLED TO, READ BACK BY AND VERIFIED WITH: DAVID BESANTI ON 04/06/18 AT 2328 JAG    Culture (A)  Final    STAPHYLOCOCCUS SPECIES (COAGULASE NEGATIVE) THE SIGNIFICANCE OF ISOLATING THIS ORGANISM FROM A SINGLE SET OF BLOOD CULTURES WHEN MULTIPLE SETS ARE DRAWN IS UNCERTAIN. PLEASE NOTIFY THE MICROBIOLOGY DEPARTMENT WITHIN ONE WEEK IF SPECIATION AND SENSITIVITIES ARE REQUIRED. Performed at [facility], [address], [place], [place] [address]     Report Status 04/09/2018 FINAL  Final  Blood Culture ID Panel (Reflexed)     Status: Abnormal   Collection Time: 04/06/18  5:18 AM  Result Value Ref Range Status   Enterococcus species NOT DETECTED NOT DETECTED Final   Listeria monocytogenes NOT DETECTED NOT DETECTED Final   Staphylococcus species DETECTED (A) NOT DETECTED Final    Comment: Methicillin (oxacillin) resistant coagulase negative staphylococcus. Possible blood culture contaminant (unless isolated from more than one blood culture draw or clinical case suggests pathogenicity). No antibiotic treatment is indicated for blood  culture contaminants. CRITICAL RESULT CALLED TO, READ BACK BY AND VERIFIED WITH: DAVID BESANTI ON 04/06/18 AT 2328 BY JAG    Staphylococcus aureus NOT DETECTED NOT DETECTED Final   Methicillin resistance DETECTED (A) NOT DETECTED Final    Comment: CRITICAL RESULT CALLED TO, READ BACK BY AND VERIFIED WITH: DAVID BESANTI ON 04/06/18 AT 2328 BY JAG    Streptococcus species NOT DETECTED NOT DETECTED Final   Streptococcus agalactiae NOT DETECTED NOT DETECTED Final   Streptococcus pneumoniae NOT DETECTED NOT DETECTED Final   Streptococcus pyogenes NOT DETECTED NOT DETECTED Final   Acinetobacter baumannii NOT DETECTED NOT DETECTED Final   Enterobacteriaceae species NOT DETECTED NOT DETECTED Final   Enterobacter cloacae complex NOT DETECTED NOT DETECTED Final   Escherichia coli NOT DETECTED NOT DETECTED Final   Klebsiella oxytoca NOT DETECTED NOT DETECTED Final   Klebsiella pneumoniae NOT DETECTED NOT DETECTED Final   Proteus species NOT DETECTED NOT DETECTED Final   Serratia marcescens NOT DETECTED NOT DETECTED Final   Haemophilus influenzae NOT DETECTED NOT DETECTED Final   Neisseria meningitidis NOT DETECTED NOT DETECTED Final   Pseudomonas aeruginosa NOT DETECTED NOT DETECTED Final   Candida albicans NOT DETECTED NOT DETECTED Final   Candida glabrata NOT DETECTED NOT DETECTED Final   Candida krusei NOT  DETECTED NOT DETECTED Final   Candida parapsilosis NOT DETECTED NOT DETECTED Final   Candida tropicalis NOT DETECTED NOT DETECTED Final    Comment: Performed at [facility], [address] [place], [place] [address]  Urine culture     Status: None   Collection Time: 04/06/18  7:15 AM  Result Value Ref Range Status   Specimen Description   Final    URINE, RANDOM Performed at [facility], [address], [place], [place] [address]    Special Requests   Final    NONE Performed at [facility], [address] [place], [place] [address]    Culture   Final    NO GROWTH Performed at [facility], [address] [place], [place] [address]    Report Status 04/07/2018 FINAL  Final  MRSA PCR Screening     Status: None  Collection Time: 04/06/18 10:19 AM  Result Value Ref Range Status   MRSA by PCR NEGATIVE NEGATIVE Final    Comment:        The GeneXpert MRSA Assay (FDA approved for NASAL specimens only), is one component of a comprehensive MRSA colonization surveillance program. It is not intended to diagnose MRSA infection nor to guide or monitor treatment for MRSA infections. Performed at [facility], [address] [place], [place] [address]     Coagulation Studies: No results for input(s): LABPROT, INR in the last 72 hours.  Imaging: No results found.  Medications:  I have reviewed the patient's current medications. Scheduled: . amantadine  100 mg Oral BID  . ciprofloxacin  500 mg Oral BID  . enoxaparin (LOVENOX) injection  40 mg Subcutaneous Q24H  . haloperidol  10 mg Oral BID  . QUEtiapine  100 mg Oral QHS  . traZODone  100 mg Oral QHS    Assessment/Plan: No seizure activity noted.  Tolerating Keppra taper.    Recommendations: 1. D/C Keppra. 2.  No further neurologic intervention is recommended at this time.  If further questions arise, please call or page at that time.  Thank you for allowing neurology to  participate in the care of this patient.  Thana FarrLeslie Envi Eagleson, MD Neurology [phone] 04/09/2018  12:18 PM   LOS: 3 days   Thana FarrLeslie Lillyona Polasek, MD Neurology [phone] 04/09/2018  11:26 AM

## 2018-04-09 NOTE — Consult Note (Signed)
[place] [facility]iat[facility] Consult   Reason for Consult: Follow-up consult for 57 year old man with schizophrenia Referring Physician: Pyreddy Patient Identification: Roberto Vasquez MRN:  [id number] Principal Diagnosis: Schizophrenia [facility]) Diagnosis:   Patient Active Problem List   Diagnosis Date Noted  . Seizure ([place]) [R56.9] 04/06/2018  . Tobacco use disorder [F17.200] 02/23/2017  . Schizophrenia ([place]) [F20.9] 02/20/2017  . Noncompliance [Z91.19] 02/20/2017  . HTN (hypertension) [I10] 03/01/2015    Total Time spent with patient: 30 minutes  Subjective:   Roberto Vasquez is a 57 y.o. male patient admitted with "I am feeling better".  HPI: Patient seen chart reviewed.  Patient was awake but hiding under a blanket when I came in.  He was willing to talk however and was pleasant and cooperative.  He says he is feeling much better.  His stomach is feeling good.  He is been able to get up and walk to the bathroom without difficulty.  He says he mentally feels "okay".  He still has hallucinations but they are minor and not causing him much trouble.  Denies any suicidal or homicidal ideation.  It does not appear that there has been any return of seizure activity.  No sign of excessive stiffness or toxicity from medicine  Past Psychiatric History: Long history of schizophrenia maintained on Haldol particularly and other psychiatric medicine.  History of intermittent noncompliance  Risk to Self:   Risk to Others:   Prior Inpatient Therapy:   Prior Outpatient Therapy:    Past Medical History:  Past Medical History:  Diagnosis Date  . Anxiety   . Hepatitis C   . Hypertension   . Schizophrenia ([place])   . Seizures ([place])     Past Surgical History:  Procedure Laterality Date  . ABDOMINAL SURGERY    . VENTRAL HERNIA REPAIR N/A 10/20/2016   Procedure: HERNIA REPAIR VENTRAL ADULT;  Surgeon: Florene Glen, MD;  Location: [facility] ORS;  Service: General;  Laterality: N/A;    Family History:  Family History  Problem Relation Age of Onset  . Hypertension Mother    Family Psychiatric  History: See previous note Social History:  Social History   Substance and Sexual Activity  Alcohol Use No     Social History   Substance and Sexual Activity  Drug Use No    Social History   Socioeconomic History  . Marital status: Single    Spouse name: Not on file  . Number of children: Not on file  . Years of education: Not on file  . Highest education level: Not on file  Occupational History  . Not on file  Social Needs  . Financial resource strain: Not on file  . Food insecurity:    Worry: Not on file    Inability: Not on file  . Transportation needs:    Medical: Not on file    Non-medical: Not on file  Tobacco Use  . Smoking status: Current Every Day Smoker    Packs/day: 1.00    Years: 40.00    Pack years: 40.00    Types: Cigarettes  . Smokeless tobacco: Never Used  Substance and Sexual Activity  . Alcohol use: No  . Drug use: No  . Sexual activity: Not on file  Lifestyle  . Physical activity:    Days per week: Not on file    Minutes per session: Not on file  . Stress: Not on file  Relationships  . Social connections:    Talks on phone: Not  on file    Gets together: Not on file    Attends religious service: Not on file    Active member of club or organization: Not on file    Attends meetings of clubs or organizations: Not on file    Relationship status: Not on file  Other Topics Concern  . Not on file  Social History Narrative   From a group home   [facility] from a jail.   Ambulates independently at baseline   Additional Social History:    Allergies:  No Known Allergies  Labs:  Results for orders placed or performed during the hospital encounter of 04/06/18 (from the past 48 hour(s))  Basic metabolic panel     Status: Abnormal   Collection Time: 04/08/18  2:52 AM  Result Value Ref Range   Sodium 143 135 - 145 mmol/L    Potassium 3.7 3.5 - 5.1 mmol/L   Chloride 109 98 - 111 mmol/L    Comment: Please note change in reference range.   CO2 26 22 - 32 mmol/L   Glucose, Bld 80 70 - 99 mg/dL    Comment: Please note change in reference range.   BUN 13 6 - 20 mg/dL    Comment: Please note change in reference range.   Creatinine, Ser 1.42 (H) 0.61 - 1.24 mg/dL   Calcium 8.7 (L) 8.9 - 10.3 mg/dL   GFR calc non Af Amer 53 (L) >60 mL/min   GFR calc Af Amer >60 >60 mL/min    Comment: (NOTE) The eGFR has been calculated using the CKD EPI equation. This calculation has not been validated in all clinical situations. eGFR's persistently <60 mL/min signify possible Chronic Kidney Disease.    Anion gap 8 5 - 15    Comment: Performed at [facility], [address] [place], [place]  [address]    Current Facility-Administered Medications  Medication Dose Route Frequency Provider Last Rate Last Dose  . acetaminophen (TYLENOL) tablet 650 mg  650 mg Oral Q6H PRN Gladstone Lighter, MD       Or  . acetaminophen (TYLENOL) suppository 650 mg  650 mg Rectal Q6H PRN Gladstone Lighter, MD      . amantadine (SYMMETREL) capsule 100 mg  100 mg Oral BID Gladstone Lighter, MD   100 mg at 04/09/18 1001  . ciprofloxacin (CIPRO) tablet 500 mg  500 mg Oral BID Saundra Shelling, MD   500 mg at 04/09/18 1002  . enoxaparin (LOVENOX) injection 40 mg  40 mg Subcutaneous Q24H Gladstone Lighter, MD   40 mg at 04/08/18 2021  . haloperidol (HALDOL) tablet 10 mg  10 mg Oral BID Gladstone Lighter, MD   10 mg at 04/09/18 1001  . LORazepam (ATIVAN) injection 2 mg  2 mg Intravenous Q4H PRN Gladstone Lighter, MD   2 mg at 04/06/18 1543  . ondansetron (ZOFRAN) tablet 4 mg  4 mg Oral Q6H PRN Gladstone Lighter, MD       Or  . ondansetron (ZOFRAN) injection 4 mg  4 mg Intravenous Q6H PRN Gladstone Lighter, MD      . QUEtiapine (SEROQUEL) tablet 100 mg  100 mg Oral QHS Saundra Shelling, MD   100 mg at 04/08/18 2237  . traZODone (DESYREL)  tablet 100 mg  100 mg Oral QHS Gladstone Lighter, MD   100 mg at 04/08/18 2236    Musculoskeletal: Strength & Muscle Tone: within normal limits Gait & Station: normal Patient leans: N/A  Psychiatric Specialty Exam: Physical Exam  Nursing  note and vitals reviewed. Constitutional: He appears well-developed and well-nourished.  HENT:  Head: Normocephalic and atraumatic.  Eyes: Pupils are equal, round, and reactive to light. Conjunctivae are normal.  Neck: Normal range of motion.  Cardiovascular: Regular rhythm and normal heart sounds.  Respiratory: Effort normal. No respiratory distress.  GI: Soft.  Musculoskeletal: Normal range of motion.  Neurological: He is alert.  Skin: Skin is warm and dry.  Psychiatric: His affect is blunt. His speech is tangential. He is not agitated, not aggressive and not hyperactive. Thought content is not paranoid. Cognition and memory are impaired. He expresses impulsivity. He expresses no homicidal and no suicidal ideation.    Review of Systems  Constitutional: Negative.   HENT: Negative.   Eyes: Negative.   Respiratory: Negative.   Cardiovascular: Negative.   Gastrointestinal: Negative.   Musculoskeletal: Negative.   Skin: Negative.   Neurological: Negative.   Psychiatric/Behavioral: Positive for hallucinations. Negative for depression, memory loss, substance abuse and suicidal ideas. The patient is not nervous/anxious and does not have insomnia.     Blood pressure 123/82, pulse (!) 47, temperature 98.3 F (36.8 C), temperature source Oral, resp. rate 18, height 6' (1.829 m), weight 74.6 kg (164 lb 6.4 oz), SpO2 98 %.Body mass index is 22.3 kg/m.  General Appearance: Casual  Eye Contact:  Fair  Speech:  Clear and Coherent  Volume:  Normal  Mood:  Euthymic  Affect:  Constricted  Thought Process:  Goal Directed  Orientation:  Full (Time, Place, and Person)  Thought Content:  Hallucinations: Auditory and Tangential  Suicidal Thoughts:  No   Homicidal Thoughts:  No  Memory:  Immediate;   Fair Recent;   Fair Remote;   Fair  Judgement:  Impaired  Insight:  Shallow  Psychomotor Activity:  Normal  Concentration:  Concentration: Fair  Recall:  [employer] of Knowledge:  Fair  Language:  Fair  Akathisia:  No  Handed:  Right  AIMS (if indicated):     Assets:  Desire for Improvement Housing Social Support  ADL's:  Impaired  Cognition:  Impaired,  Mild  Sleep:        Treatment Plan Summary: Daily contact with patient to assess and evaluate symptoms and progress in treatment, Medication management and Plan 57 year old man with schizophrenia appears to be returning to his baseline.  No need to change any of the psychiatric medicine.  Continue current doses of Haldol.  Patient can be discharged back home on his current medication.  He should have regular follow-up in the community.  No indication of a need for psychiatric hospitalization.  If he remains in the hospital I will continue to follow up.  Disposition: No evidence of imminent risk to self or others at present.   Patient does not meet criteria for psychiatric inpatient admission. Supportive therapy provided about ongoing stressors. Discussed crisis plan, support from social network, calling 911, coming to the Emergency Department, and calling Suicide Hotline.  Alethia Berthold, MD 04/09/2018 1:02 PM

## 2018-04-09 NOTE — Progress Notes (Signed)
Patient was discharged to his group home with transportation. Discharge instruction given to patient, patient verbalized understanding.

## 2018-04-09 NOTE — Clinical Social Work Note (Signed)
Patient is medically ready for discharge today. CSW contacted Allen NorrisFrances Watlington Group home director [phone]. Ms. [place] states that patient can return today and she can pick him up when ready.   Ruthe Mannanandace Lamaya Hyneman MSW, [address] [phone]

## 2018-04-11 LAB — CULTURE, BLOOD (ROUTINE X 2)
CULTURE: NO GROWTH
Special Requests: ADEQUATE

## 2018-04-12 LAB — CULTURE, BLOOD (ROUTINE X 2): Special Requests: ADEQUATE

## 2018-04-29 ENCOUNTER — Emergency Department
Admission: EM | Admit: 2018-04-29 | Discharge: 2018-04-29 | Disposition: A | Discharge status: Home / Self Care | Payer: Medicare Other | Attending: Emergency Medicine | Admitting: Emergency Medicine

## 2018-04-29 ENCOUNTER — Encounter: Payer: Self-pay | Admitting: Emergency Medicine

## 2018-04-29 ENCOUNTER — Other Ambulatory Visit: Payer: Self-pay

## 2018-04-29 DIAGNOSIS — K5900 Constipation, unspecified: Secondary | ICD-10-CM | POA: Diagnosis not present

## 2018-04-29 DIAGNOSIS — F200 Paranoid schizophrenia: Secondary | ICD-10-CM | POA: Insufficient documentation

## 2018-04-29 DIAGNOSIS — F17210 Nicotine dependence, cigarettes, uncomplicated: Secondary | ICD-10-CM | POA: Insufficient documentation

## 2018-04-29 DIAGNOSIS — Z046 Encounter for general psychiatric examination, requested by authority: Secondary | ICD-10-CM | POA: Diagnosis present

## 2018-04-29 DIAGNOSIS — Z79899 Other long term (current) drug therapy: Secondary | ICD-10-CM | POA: Diagnosis not present

## 2018-04-29 DIAGNOSIS — I10 Essential (primary) hypertension: Secondary | ICD-10-CM | POA: Insufficient documentation

## 2018-04-29 DIAGNOSIS — F209 Schizophrenia, unspecified: Secondary | ICD-10-CM

## 2018-04-29 LAB — COMPREHENSIVE METABOLIC PANEL
ALT: 16 U/L (ref 0–44)
AST: 24 U/L (ref 15–41)
Albumin: 4.3 g/dL (ref 3.5–5.0)
Alkaline Phosphatase: 58 U/L (ref 38–126)
Anion gap: 5 (ref 5–15)
BUN: 20 mg/dL (ref 6–20)
CO2: 32 mmol/L (ref 22–32)
Calcium: 9.8 mg/dL (ref 8.9–10.3)
Chloride: 103 mmol/L (ref 98–111)
Creatinine, Ser: 1.37 mg/dL — ABNORMAL HIGH (ref 0.61–1.24)
GFR calc Af Amer: >60 mL/min (ref >60)
GFR calc non Af Amer: 55 mL/min — ABNORMAL LOW (ref >60)
Glucose, Bld: 104 mg/dL — ABNORMAL HIGH (ref 70–99)
Potassium: 4.3 mmol/L (ref 3.5–5.1)
Sodium: 140 mmol/L (ref 135–145)
Total Bilirubin: 1 mg/dL (ref 0.3–1.2)
Total Protein: 8.1 g/dL (ref 6.5–8.1)

## 2018-04-29 LAB — CBC
HCT: 41.7 % (ref 40.0–52.0)
Hemoglobin: 14.4 g/dL (ref 13.0–18.0)
MCH: 32.8 pg (ref 26.0–34.0)
MCHC: 34.5 g/dL (ref 32.0–36.0)
MCV: 95 fL (ref 80.0–100.0)
Platelets: 190 10*3/uL (ref 150–440)
RBC: 4.39 MIL/uL — ABNORMAL LOW (ref 4.40–5.90)
RDW: 11.9 % (ref 11.5–14.5)
WBC: 3.6 10*3/uL — ABNORMAL LOW (ref 3.8–10.6)

## 2018-04-29 LAB — ETHANOL: Alcohol, Ethyl (B): 10 mg/dL — ABNORMAL HIGH (ref <10)
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
## 2018-04-29 NOTE — ED Notes (Signed)
[facility] [phone]

## 2018-04-29 NOTE — ED Notes (Signed)
Pt denies SI/HI/AVH. Pt given discharge instructions. Pt states understanding. Pt states receipt of all belongings. Patient picked up by group home employee.
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
## 2018-04-29 NOTE — ED Notes (Signed)
Patient undressed and patient clothes in patient's bag , one pair of blue jeans ,one pair of white socks,one pair of red and black shoes , one brown belt ,one long sleeve shirt blue with red, and white stripes, one black tank top,and black pair of underwear.

## 2018-04-29 NOTE — ED Provider Notes (Signed)
Specialist on call evaluated patient. Feel he is safe for discharge.    Phineas SemenGoodman, Addalynne Golding, MD 04/29/18 [phone]
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
## 2018-04-29 NOTE — ED Notes (Signed)
SOC complete.  

## 2018-04-29 NOTE — ED Notes (Signed)
E signature pad not available.  

## 2018-04-29 NOTE — ED Notes (Signed)
Pt/VOL/ SOC pending

## 2018-04-29 NOTE — ED Triage Notes (Signed)
Pt states he was able to have a bm this am, states it hurt, however, he is in no pain now. A&O, group home sending for behavioral eval.

## 2018-04-29 NOTE — ED Notes (Signed)
Patient walked over to [facility]HU at this time.

## 2018-04-29 NOTE — Discharge Instructions (Addendum)
Please seek medical attention and help for any thoughts about wanting to harm yourself, harm others, any concerning change in behavior, severe depression, inappropriate drug use or any other new or concerning symptoms. ° °
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
## 2018-05-14 ENCOUNTER — Emergency Department: Payer: Medicare Other

## 2018-05-14 ENCOUNTER — Inpatient Hospital Stay
Admission: EM | Admit: 2018-05-14 | Discharge: 2018-05-16 | DRG: 689 | Disposition: A | Discharge status: Home / Self Care | Payer: Medicare Other | Attending: Internal Medicine | Admitting: Internal Medicine

## 2018-05-14 ENCOUNTER — Other Ambulatory Visit: Payer: Self-pay

## 2018-05-14 DIAGNOSIS — F209 Schizophrenia, unspecified: Secondary | ICD-10-CM | POA: Diagnosis present

## 2018-05-14 DIAGNOSIS — I10 Essential (primary) hypertension: Secondary | ICD-10-CM | POA: Diagnosis present

## 2018-05-14 DIAGNOSIS — I959 Hypotension, unspecified: Secondary | ICD-10-CM | POA: Diagnosis present

## 2018-05-14 DIAGNOSIS — R451 Restlessness and agitation: Secondary | ICD-10-CM | POA: Diagnosis present

## 2018-05-14 DIAGNOSIS — N390 Urinary tract infection, site not specified: Secondary | ICD-10-CM | POA: Diagnosis present

## 2018-05-14 DIAGNOSIS — F17210 Nicotine dependence, cigarettes, uncomplicated: Secondary | ICD-10-CM | POA: Diagnosis present

## 2018-05-14 DIAGNOSIS — Z79899 Other long term (current) drug therapy: Secondary | ICD-10-CM | POA: Diagnosis not present

## 2018-05-14 DIAGNOSIS — G9341 Metabolic encephalopathy: Secondary | ICD-10-CM | POA: Diagnosis present

## 2018-05-14 DIAGNOSIS — F203 Undifferentiated schizophrenia: Secondary | ICD-10-CM | POA: Diagnosis not present

## 2018-05-14 LAB — URINALYSIS, COMPLETE (UACMP) WITH MICROSCOPIC
Bilirubin Urine: NEGATIVE
GLUCOSE, UA: NEGATIVE mg/dL
Ketones, ur: NEGATIVE mg/dL
NITRITE: POSITIVE — AB
Protein, ur: 100 mg/dL — AB
Specific Gravity, Urine: 1.015 (ref 1.005–1.030)
WBC, UA: >50 WBC/hpf — ABNORMAL HIGH (ref 0–5)
pH: 6 (ref 5.0–8.0)

## 2018-05-14 LAB — COMPREHENSIVE METABOLIC PANEL
ALBUMIN: 4.5 g/dL (ref 3.5–5.0)
ALT: 15 U/L (ref 0–44)
ANION GAP: 10 (ref 5–15)
AST: 28 U/L (ref 15–41)
Alkaline Phosphatase: 66 U/L (ref 38–126)
BILIRUBIN TOTAL: 1.4 mg/dL — AB (ref 0.3–1.2)
BUN: 17 mg/dL (ref 6–20)
CHLORIDE: 99 mmol/L (ref 98–111)
CO2: 30 mmol/L (ref 22–32)
Calcium: 9.6 mg/dL (ref 8.9–10.3)
Creatinine, Ser: 1.38 mg/dL — ABNORMAL HIGH (ref 0.61–1.24)
GFR calc Af Amer: >60 mL/min (ref >60)
GFR calc non Af Amer: 54 mL/min — ABNORMAL LOW (ref >60)
GLUCOSE: 103 mg/dL — AB (ref 70–99)
POTASSIUM: 3.8 mmol/L (ref 3.5–5.1)
Sodium: 139 mmol/L (ref 135–145)
TOTAL PROTEIN: 8.3 g/dL — AB (ref 6.5–8.1)

## 2018-05-14 LAB — URINE DRUG SCREEN, QUALITATIVE (ARMC ONLY)
AMPHETAMINES, UR SCREEN: NOT DETECTED
Barbiturates, Ur Screen: NOT DETECTED
COCAINE METABOLITE, UR ~~LOC~~: NOT DETECTED
Cannabinoid 50 Ng, Ur ~~LOC~~: NOT DETECTED
MDMA (ECSTASY) UR SCREEN: NOT DETECTED
Methadone Scn, Ur: NOT DETECTED
OPIATE, UR SCREEN: NOT DETECTED
Phencyclidine (PCP) Ur S: NOT DETECTED
Tricyclic, Ur Screen: NOT DETECTED

## 2018-05-14 LAB — CBC
HEMATOCRIT: 40.1 % (ref 40.0–52.0)
Hemoglobin: 13.8 g/dL (ref 13.0–18.0)
MCH: 32.2 pg (ref 26.0–34.0)
MCHC: 34.4 g/dL (ref 32.0–36.0)
MCV: 93.7 fL (ref 80.0–100.0)
Platelets: 232 10*3/uL (ref 150–440)
RBC: 4.28 MIL/uL — ABNORMAL LOW (ref 4.40–5.90)
RDW: 11.8 % (ref 11.5–14.5)
WBC: 4.8 10*3/uL (ref 3.8–10.6)

## 2018-05-14 LAB — GLUCOSE, CAPILLARY: Glucose-Capillary: 95 mg/dL (ref 70–99)

## 2018-05-14 MED ORDER — LORAZEPAM BOLUS VIA INFUSION: 1.0000 mg | INTRAVENOUS | Status: DC | PRN

## 2018-05-14 MED ORDER — LISINOPRIL 20 MG PO TABS: 20.0000 mg | ORAL_TABLET | Freq: Every day | ORAL | Status: DC

## 2018-05-14 MED ORDER — ONDANSETRON HCL 4 MG/2ML IJ SOLN: 4.0000 mg | Freq: Four times a day (QID) | INTRAMUSCULAR | Status: DC | PRN

## 2018-05-14 MED ORDER — QUETIAPINE FUMARATE 100 MG PO TABS
200.0000 mg | ORAL_TABLET | Freq: Every day | ORAL | Status: DC
  Administered 2018-05-14 – 2018-05-15 (×2): 200 mg via ORAL
  Filled 2018-05-14 (×2): qty 2
  Filled 2018-05-14: qty 8
  Filled 2018-05-14: qty 2

## 2018-05-14 MED ORDER — HYDROCHLOROTHIAZIDE 25 MG PO TABS: 12.5000 mg | ORAL_TABLET | Freq: Every day | ORAL | Status: DC

## 2018-05-14 MED ORDER — QUETIAPINE FUMARATE 25 MG PO TABS
100.0000 mg | ORAL_TABLET | Freq: Every day | ORAL | Status: DC
  Filled 2018-05-14: qty 4

## 2018-05-14 MED ORDER — ONDANSETRON HCL 4 MG PO TABS: 4.0000 mg | ORAL_TABLET | Freq: Four times a day (QID) | ORAL | Status: DC | PRN

## 2018-05-14 MED ORDER — SODIUM CHLORIDE 0.9 % IV SOLN
1.0000 g | INTRAVENOUS | Status: DC
  Filled 2018-05-14: qty 10

## 2018-05-14 MED ORDER — LORAZEPAM 2 MG/ML IJ SOLN: 1.0000 mg | INTRAMUSCULAR | Status: DC | PRN

## 2018-05-14 MED ORDER — SODIUM CHLORIDE 0.9 % IV SOLN
INTRAVENOUS | Status: DC | PRN
  Administered 2018-05-14: 13:00:00 via INTRAVENOUS

## 2018-05-14 MED ORDER — ARIPIPRAZOLE ER 300 MG IM PRSY: 300.0000 mg | PREFILLED_SYRINGE | INTRAMUSCULAR | Status: DC

## 2018-05-14 MED ORDER — POLYETHYLENE GLYCOL 3350 17 G PO PACK: 17.0000 g | PACK | Freq: Every day | ORAL | Status: DC | PRN

## 2018-05-14 MED ORDER — SODIUM CHLORIDE 0.9 % IV SOLN
1.0000 g | Freq: Once | INTRAVENOUS | Status: AC
  Administered 2018-05-14: 1 g via INTRAVENOUS
  Filled 2018-05-14: qty 10

## 2018-05-14 MED ORDER — ACETAMINOPHEN 650 MG RE SUPP: 650.0000 mg | Freq: Four times a day (QID) | RECTAL | Status: DC | PRN

## 2018-05-14 MED ORDER — TRAZODONE HCL 50 MG PO TABS
100.0000 mg | ORAL_TABLET | Freq: Every day | ORAL | Status: DC
  Administered 2018-05-14 – 2018-05-15 (×2): 100 mg via ORAL
  Filled 2018-05-14 (×2): qty 2

## 2018-05-14 MED ORDER — ACETAMINOPHEN 325 MG PO TABS: 650.0000 mg | ORAL_TABLET | Freq: Four times a day (QID) | ORAL | Status: DC | PRN

## 2018-05-14 MED ORDER — AMLODIPINE BESYLATE 10 MG PO TABS: 10.0000 mg | ORAL_TABLET | Freq: Every day | ORAL | Status: DC

## 2018-05-14 MED ORDER — ENOXAPARIN SODIUM 40 MG/0.4ML ~~LOC~~ SOLN
40.0000 mg | SUBCUTANEOUS | Status: DC
  Filled 2018-05-14 (×2): qty 0.4

## 2018-05-14 NOTE — ED Provider Notes (Signed)
[facility] Emergency Department Provider Note   ____________________________________________    I have reviewed the triage vital signs and the nursing notes.   HISTORY  Chief Complaint Altered Mental Status   Patient unable to provide significant history  HPI Roberto Vasquez is a 57 y.o. male with a history of schizophrenia who presents today with reports of altered mental status.  Group home staff report that over the last week he has had a decline in his baseline which is been significant.  Has had more confusion over the last several days as well.  Has had loss of bowel continence.  Patient is unable to provide significant history   Past Medical History:  Diagnosis Date  . Anxiety   . Hepatitis C   . Hypertension   . Schizophrenia (HCC)   . Seizures [facility](HCC)     Patient Active Problem List   Diagnosis Date Noted  . UTI (urinary tract infection) 05/14/2018  . Seizure (HCC) 04/06/2018  . Tobacco use disorder 02/23/2017  . Schizophrenia (HCC) 02/20/2017  . Noncompliance 02/20/2017  . HTN (hypertension) 03/01/2015    Past Surgical History:  Procedure Laterality Date  . ABDOMINAL SURGERY    . VENTRAL HERNIA REPAIR N/A 10/20/2016   Procedure: HERNIA REPAIR VENTRAL ADULT;  Surgeon: Lattie Hawichard E Cooper, MD;  Location: [facility] ORS;  Service: General;  Laterality: N/A;    Prior to Admission medications   Medication Sig Start Date End Date Taking? Authorizing Provider  amLODipine (NORVASC) 10 MG tablet Take 10 mg by mouth daily.    [provider]  ARIPiprazole ER (ABILIFY MAINTENA) 300 MG PRSY prefilled syringe Inject 300 mg into the muscle every 28 (twenty-eight) days.    [provider]  hydrochlorothiazide (HYDRODIURIL) 12.5 MG tablet Take 12.5 mg by mouth daily.    [provider]  lisinopril (PRINIVIL,ZESTRIL) 20 MG tablet Take 20 mg by mouth daily.    [provider]  QUEtiapine (SEROQUEL) 100 MG tablet  Take 100 mg by mouth at bedtime.    [provider]  traZODone (DESYREL) 100 MG tablet Take 1 tablet (100 mg total) by mouth at bedtime. 02/26/17   Jimmy FootmanHernandez-Gonzalez, Andrea, MD     Allergies Patient has no known allergies.  Family History  Problem Relation Age of Onset  . Hypertension Mother     Social History Social History   Tobacco Use  . Smoking status: Current Every Day Smoker    Packs/day: 1.00    Years: 40.00    Pack years: 40.00    Types: Cigarettes  . Smokeless tobacco: Never Used  Substance Use Topics  . Alcohol use: No  . Drug use: No    5 caveat: Review of Systems limited by altered mental status     ____________________________________________   PHYSICAL EXAM:  VITAL SIGNS: ED Triage Vitals  Enc Vitals Group     BP 05/14/18 1100 109/81     Pulse Rate 05/14/18 1130 66     Resp 05/14/18 1100 17     Temp --      Temp src --      SpO2 05/14/18 1130 100 %     Weight 05/14/18 0946 81.6 kg (180 lb)     Height 05/14/18 0946 1.829 m (6')     Head Circumference --      Peak Flow --      Pain Score 05/14/18 0946 0     Pain Loc --  Pain Edu? --      Excl. in GC? --     Constitutional: Alert   Eyes: Conjunctivae are normal.   Nose: No congestion/rhinnorhea. Mouth/Throat: Mucous membranes are moist.    Cardiovascular: Normal rate, regular rhythm. Grossly normal heart sounds.  Good peripheral circulation. Respiratory: Normal respiratory effort.  No retractions. Lungs CTAB. Gastrointestinal: Soft and nontender. No distention.  No CVA tenderness.  Musculoskeletal: No lower extremity tenderness nor edema.  Warm and well perfused Neurologic:  Normal speech and language. No gross focal neurologic deficits are appreciated.  Skin:  Skin is warm, dry and intact. No rash noted.   ____________________________________________   LABS (all labs ordered are listed, but only abnormal results are displayed)  Labs Reviewed  COMPREHENSIVE  METABOLIC PANEL - Abnormal; Notable for the following components:      Result Value   Glucose, Bld 103 (*)    Creatinine, Ser 1.38 (*)    Total Protein 8.3 (*)    Total Bilirubin 1.4 (*)    GFR calc non Af Amer 54 (*)    All other components within normal limits  CBC - Abnormal; Notable for the following components:   RBC 4.28 (*)    All other components within normal limits  URINALYSIS, COMPLETE (UACMP) WITH MICROSCOPIC - Abnormal; Notable for the following components:   Color, Urine AMBER (*)    APPearance CLOUDY (*)    Hgb urine dipstick SMALL (*)    Protein, ur 100 (*)    Nitrite POSITIVE (*)    Leukocytes, UA MODERATE (*)    WBC, UA >50 (*)    Bacteria, UA MANY (*)    All other components within normal limits  URINE DRUG SCREEN, QUALITATIVE ([facility] ONLY) - Abnormal; Notable for the following components:   Benzodiazepine, Ur Scrn TEST NOT PERFORMED, REAGENT NOT AVAILABLE (*)    All other components within normal limits  URINE CULTURE  GLUCOSE, CAPILLARY  CBG MONITORING, ED   ____________________________________________  EKG  None ____________________________________________  RADIOLOGY  Chest x-ray no acute distress ____________________________________________   PROCEDURES  Procedure(s) performed: No  Procedures   Critical Care performed: No ____________________________________________   INITIAL IMPRESSION / ASSESSMENT AND PLAN / ED COURSE  Pertinent labs & imaging results that were available during my care of the patient were reviewed by me and considered in my medical decision making (see chart for details).  Patient presents with gradually worsening mental status.  History of states of hernia, this may be a exacerbation of schizophrenia however decline seems more in line with metabolic encephalopathy.  We will check for signs of infection with x-ray, labs, urinalysis.  Urinalysis is consistent with urinary tract infection, urine culture added on.  IV  Rocephin ordered.  Patient will be admitted to the hospital service    ____________________________________________   FINAL CLINICAL IMPRESSION(S) / ED DIAGNOSES  Final diagnoses:  Lower urinary tract infectious disease  Metabolic encephalopathy        Note:  This document was prepared using Dragon voice recognition software and may include unintentional dictation errors.    Jene Every, MD 05/14/18 [phone]
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
## 2018-05-14 NOTE — ED Notes (Signed)
Called lab to add urine culture, spoke with Darl PikesSusan

## 2018-05-14 NOTE — ED Notes (Signed)
Updated Allen NorrisFrances Vasquez patient's caregiver [phone]. She states she only works M-F until 8 pm.

## 2018-05-14 NOTE — Progress Notes (Signed)
Family Meeting Note  Advance Directive:yes  Today a meeting took place with the Patient.  Patient is able to participate   The following clinical team members were present during this meeting:MD  The following were discussed:Patient's diagnosis: Hypertension, urinary tract infection, schizophrenia, Patient's progosis: Unable to determine and Goals for treatment: Full Code  Additional follow-up to be provided: prn  Time spent during discussion:20 minutes  Roberto SolMontell D Jnaya Butrick, MD
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
## 2018-05-14 NOTE — ED Notes (Signed)
Glucose - 95
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
## 2018-05-15 MED ORDER — CEPHALEXIN 500 MG PO CAPS
500.0000 mg | ORAL_CAPSULE | Freq: Three times a day (TID) | ORAL | Status: DC
  Administered 2018-05-15 – 2018-05-16 (×3): 500 mg via ORAL
  Filled 2018-05-15 (×4): qty 1

## 2018-05-15 MED ORDER — LEVOFLOXACIN 750 MG PO TABS: 750.0000 mg | ORAL_TABLET | ORAL | Status: DC

## 2018-05-15 NOTE — Plan of Care (Signed)
  Problem: Clinical Measurements: Goal: Ability to maintain clinical measurements within normal limits will improve Outcome: Progressing   Problem: Activity: Goal: Risk for activity intolerance will decrease Outcome: Progressing   Problem: Coping: Goal: Level of anxiety will decrease Outcome: Progressing   Problem: Elimination: Goal: Will not experience complications related to bowel motility Outcome: Progressing Goal: Will not experience complications related to urinary retention Outcome: Progressing   Problem: Pain Managment: Goal: General experience of comfort will improve Outcome: Progressing   Problem: Safety: Goal: Ability to remain free from injury will improve Outcome: Progressing   Problem: Skin Integrity: Goal: Risk for impaired skin integrity will decrease Outcome: Progressing

## 2018-05-15 NOTE — Progress Notes (Signed)
[facility] - [place] at [facility]   PATIENT NAME: Roberto Vasquez    MR#:  [id number]  DATE OF BIRTH:  1960/10/03  SUBJECTIVE:  CHIEF COMPLAINT:   Chief Complaint  Patient presents with  . Altered Mental Status   Agitated at times Pulled out IV  REVIEW OF SYSTEMS:    Review of Systems  Unable to perform ROS: Mental acuity    DRUG ALLERGIES:  No Known Allergies  VITALS:  Blood pressure 101/62, pulse 79, temperature 98.2 F (36.8 C), temperature source Oral, resp. rate 15, height 6' (1.829 m), weight 81.6 kg, SpO2 99 %.  PHYSICAL EXAMINATION:   Physical Exam  GENERAL:  57 y.o.-year-old patient lying in the bed with no acute distress.  EYES: Pupils equal, round, reactive to light and accommodation. No scleral icterus. Extraocular muscles intact.  HEENT: Head atraumatic, normocephalic. Oropharynx and nasopharynx clear.  NECK:  Supple, no jugular venous distention. No thyroid enlargement, no tenderness.  LUNGS: Normal breath sounds bilaterally, no wheezing, rales, rhonchi. No use of accessory muscles of respiration.  CARDIOVASCULAR: S1, S2 normal. No murmurs, rubs, or gallops.  ABDOMEN: Soft, nontender, nondistended. Bowel sounds present. No organomegaly or mass.  EXTREMITIES: No cyanosis, clubbing or edema b/l.    NEUROLOGIC: Cranial nerves II through XII are intact. No focal Motor or sensory deficits b/l.   PSYCHIATRIC: The patient is alert and awake SKIN: No obvious rash, lesion, or ulcer.   LABORATORY PANEL:   CBC Recent Labs  Lab 05/14/18 0949  WBC 4.8  HGB 13.8  HCT 40.1  PLT 232   ------------------------------------------------------------------------------------------------------------------ Chemistries  Recent Labs  Lab 05/14/18 0949  NA 139  K 3.8  CL 99  CO2 30  GLUCOSE 103*  BUN 17  CREATININE 1.38*  CALCIUM 9.6  AST 28  ALT 15  ALKPHOS 66  BILITOT 1.4*    ------------------------------------------------------------------------------------------------------------------  Cardiac Enzymes No results for input(s): TROPONINI in the last 168 hours. ------------------------------------------------------------------------------------------------------------------  RADIOLOGY:  Dg Chest Port 1 View  Result Date: 05/14/2018 CLINICAL DATA:  Injury. EXAM: PORTABLE CHEST 1 VIEW COMPARISON:  04/06/2018. FINDINGS: Mediastinum and hilar structures normal. Lungs are clear. No pleural effusion pneumothorax. Heart size normal. No acute bony abnormality. IMPRESSION: No acute abnormality. Electronically Signed   By: Maisie Fushomas  Register   On: 05/14/2018 10:27     ASSESSMENT AND PLAN:   *Acute urinary tract infection Abx. Waiting for cx Old cx with citrobacter  * combative/aggressive behavior Psychiatry consulted  * Chronic schizophrenia  * Hypertension Home meds stopped due to hypotension  All the records are reviewed and case discussed with Care Management/Social Worker Management plans discussed with the patient, family and they are in agreement.  CODE STATUS: FULL CODE  DVT Prophylaxis: SCDs  TOTAL TIME TAKING CARE OF THIS PATIENT: 35 minutes.   POSSIBLE D/C IN 1-2 DAYS, DEPENDING ON CLINICAL CONDITION.  Molinda BailiffSrikar R Onesti Bonfiglio M.D on 05/15/2018 at 2:31 PM  Between 7am to 6pm - Pager - [phone]  After 6pm go to [email or web address] - password [email or web address] [facility]  [facility] [place] [facility]  Office  [phone]  CC: Primary care physician; Sherrie MustacheJadali, Fayegh, MD (Inactive)  Note: This dictation was prepared with Dragon dictation along with smaller phrase technology. Any transcriptional errors that result from this process are unintentional.

## 2018-05-16 MED ORDER — CEPHALEXIN 500 MG PO CAPS: 500.0000 mg | ORAL_CAPSULE | Freq: Three times a day (TID) | ORAL | 0 refills | Status: AC

## 2018-05-16 MED ORDER — QUETIAPINE FUMARATE 200 MG PO TABS: 200.0000 mg | ORAL_TABLET | Freq: Every day | ORAL | 0 refills | Status: DC

## 2018-05-16 NOTE — Clinical Social Work Note (Signed)
Clinical Social Work Assessment  Patient Details  Name: Roberto Vasquez MRN: [id number] Date of Birth: 19-Dec-1960  Date of referral:  05/16/18               Reason for consult:  Facility Placement                Permission sought to share information with:  Chartered certified accountant granted to share information::  Yes, Verbal Permission Granted  Name::        Agency::  [facility]  Relationship::     Contact Information:     Housing/Transportation Living arrangements for the past 2 months:  [place] of Information:  Patient, Medical Team, Facility Patient Interpreter Needed:  None Criminal Activity/Legal Involvement Pertinent to Current Situation/Hospitalization:  No - Comment as needed Significant Relationships:  Warehouse manager, Parents Lives with:  Facility Resident Do you feel safe going back to the place where you live?  Yes Need for family participation in patient care:  No (Coment)  Care giving concerns:  Patient admitted from a group home   Social Worker assessment / plan:  The CSW met with the patient at bedside to discuss discharge planning. The patient agreed with return to the [facility] Group Home and gave verbal permission to contact them. The CSW spoke with Ms. Allene Pyo about imminent discharge. Ms. Oswaldo Milian advised that they cannot have the patient return today unless a dose of Keflex for tomorrow is provided as their pharmacy is closed on Sundays. The CSW updated the medical team. The facility will transport the patient once he is finalized for discharge. The CSW will provide the discharge summary and FL 2 in printed form for the facility representative to receive at the time of transport. The CSW is signing off. Please consult should additional needs arise.  Employment status:  Disabled (Comment on whether or not currently receiving Disability) Insurance information:  Medicare, Medicaid [place] [facility]  Recommendations:  Not assessed at this time Information / Referral to community resources:     Patient/Family's Response to care: The patient thanked the CSW for care. The facility representative was brusque.  Patient/Family's Understanding of and Emotional Response to Diagnosis, Current Treatment, and Prognosis: The patient understands and agrees with the discharge plan.  Emotional Assessment Appearance:  Appears stated age Attitude/Demeanor/Rapport:  Other(Calm) Affect (typically observed):  Stable, Flat Orientation:  Oriented to Self, Oriented to Place, Oriented to  Time, Oriented to Situation Alcohol / Substance use:  Never Used Psych involvement (Current and /or in the community):  Yes (Comment)(Patient has schizophrenia)  Discharge Needs  Concerns to be addressed:  Care Coordination, Discharge Planning Concerns, Mental Health Concerns Readmission within the last 30 days:  No Current discharge risk:  Psychiatric Illness Barriers to Discharge:  No Barriers Identified   Zettie Pho, LCSW 05/16/2018, 10:57 AM

## 2018-05-16 NOTE — Progress Notes (Signed)
[facility]pidio AnisSudini for pt to go back to group home today; Care Management/SW previously prepared discharge packet for the pt to take to the group home; AVS printed and placed in the envelope for the pt; pt discharged via wheelchair by nursing to the visitor's entrance to meet the representative in a black escillade automobile for discharge
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
## 2018-05-16 NOTE — Progress Notes (Signed)
[facility] [phone] spoke to F. Watlington advising her that the pt is ready for discharge; F. Verbally advised that she will have her representative pick the pt up at the visitor's entrance in 10 minutes in a black escillade; the pt should be out there because he will not wait on him; pt dressed and ready to go

## 2018-05-16 NOTE — [place] FL2 (Signed)
[place] MEDICAID FL2 LEVEL OF CARE SCREENING TOOL     IDENTIFICATION  Patient Name: Roberto Vasquez Birthdate: [birth year] Sex: male Admission Date (Current Location): 05/14/2018  [place] and [place] Number:  [address] [id number] [facility] Facility and Address:  [facility], [address], [place], [place] [address]      Provider Number: [id number]  Attending Physician Name and Address:  Milagros Loll, MD  Relative Name and Phone Number:  Hosea Hanawalt (mother) [phone]    Current Level of Care: Hospital Recommended Level of Care: Other (Comment)(Group Home) Prior Approval Number:    Date Approved/Denied:   PASRR Number:    Discharge Plan: Other (Comment)(Group home)    Current Diagnoses: Patient Active Problem List   Diagnosis Date Noted  . UTI (urinary tract infection) 05/14/2018  . Seizure (HCC) 04/06/2018  . Tobacco use disorder 02/23/2017  . Schizophrenia (HCC) 02/20/2017  . Noncompliance 02/20/2017  . HTN (hypertension) 03/01/2015    Orientation RESPIRATION BLADDER Height & Weight     Self, Situation, Place  Normal Continent Weight: 180 lb (81.6 kg) Height:  6' (182.9 cm)  BEHAVIORAL SYMPTOMS/MOOD NEUROLOGICAL BOWEL NUTRITION STATUS      Continent Diet(Heart Healthy)  AMBULATORY STATUS COMMUNICATION OF NEEDS Skin   Independent   Normal                       Personal Care Assistance Level of Assistance  Bathing, Feeding, Dressing Bathing Assistance: Independent Feeding assistance: Independent Dressing Assistance: Independent     Functional Limitations Info  Sight, Hearing, Speech Sight Info: Adequate Hearing Info: Adequate Speech Info: Adequate    SPECIAL CARE FACTORS FREQUENCY                       Contractures Contractures Info: Not present    Additional Factors Info  Code Status, Allergies, Psychotropic Code Status Info: Full Allergies Info: No Known Allergies Psychotropic Info: Abilify,  Seroquel, Trazadone         Current Medications (05/16/2018):  This is the current hospital active medication list Current Facility-Administered Medications  Medication Dose Route Frequency Provider Last Rate Last Dose  . acetaminophen (TYLENOL) tablet 650 mg  650 mg Oral Q6H PRN Salary, Montell D, MD       Or  . acetaminophen (TYLENOL) suppository 650 mg  650 mg Rectal Q6H PRN Salary, Montell D, MD      . Melene Muller ON 06/02/2018] ARIPiprazole ER (ABILIFY MAINTENA) 300 MG prefilled syringe 300 mg  300 mg Intramuscular Q28 days Salary, Montell D, MD      . cephALEXin (KEFLEX) capsule 500 mg  500 mg Oral Q8H Sudini, Srikar, MD   500 mg at 05/16/18 0504  . enoxaparin (LOVENOX) injection 40 mg  40 mg Subcutaneous Q24H Salary, Montell D, MD      . LORazepam (ATIVAN) injection 1 mg  1 mg Intravenous Q4H PRN Salary, Montell D, MD      . ondansetron (ZOFRAN) tablet 4 mg  4 mg Oral Q6H PRN Salary, Montell D, MD       Or  . ondansetron (ZOFRAN) injection 4 mg  4 mg Intravenous Q6H PRN Salary, Montell D, MD      . polyethylene glycol (MIRALAX / GLYCOLAX) packet 17 g  17 g Oral Daily PRN Salary, Montell D, MD      . QUEtiapine (SEROQUEL) tablet 200 mg  200 mg Oral QHS Clapacs, John T, MD   200 mg at  05/15/18 2134  . traZODone (DESYREL) tablet 100 mg  100 mg Oral QHS Salary, Jetty DuhamelMontell D, MD   100 mg at 05/15/18 2133     Discharge Medications: Medication List    STOP taking these medications   amLODipine 10 MG tablet Commonly known as:  NORVASC   hydrochlorothiazide 12.5 MG tablet Commonly known as:  HYDRODIURIL   lisinopril 20 MG tablet Commonly known as:  PRINIVIL,ZESTRIL     TAKE these medications   ABILIFY MAINTENA 300 MG Prsy prefilled syringe Generic drug:  ARIPiprazole ER Inject 300 mg into the muscle every 28 (twenty-eight) days.   cephALEXin 500 MG capsule Commonly known as:  KEFLEX Take 1 capsule (500 mg total) by mouth 3 (three) times daily.   QUEtiapine 200 MG  tablet Commonly known as:  SEROQUEL Take 1 tablet (200 mg total) by mouth at bedtime. What changed:    medication strength  how much to take   traZODone 100 MG tablet Commonly known as:  DESYREL Take 1 tablet (100 mg total) by mouth at bedtime.   Relevant Imaging Results:  Relevant Lab Results:   Additional Information SS#[id number]  Judi CongKaren M Dustine Stickler, LCSW

## 2018-05-17 LAB — URINE CULTURE: Culture: >=100000 — AB

## 2018-05-21 ENCOUNTER — Emergency Department: Admit: 2018-05-21 | Payer: Medicare Other | Admitting: Cardiovascular Disease

## 2018-05-21 ENCOUNTER — Encounter
Admission: EM | Disposition: A | Discharge status: Home / Self Care | Payer: Self-pay | Source: Home / Self Care | Attending: Emergency Medicine

## 2018-05-21 ENCOUNTER — Emergency Department
Admission: EM | Admit: 2018-05-21 | Discharge: 2018-05-21 | Disposition: A | Discharge status: Home / Self Care | Payer: Medicare Other | Attending: Emergency Medicine | Admitting: Emergency Medicine

## 2018-05-21 ENCOUNTER — Other Ambulatory Visit: Payer: Self-pay

## 2018-05-21 DIAGNOSIS — I10 Essential (primary) hypertension: Secondary | ICD-10-CM | POA: Insufficient documentation

## 2018-05-21 DIAGNOSIS — R531 Weakness: Secondary | ICD-10-CM | POA: Diagnosis not present

## 2018-05-21 DIAGNOSIS — Z79899 Other long term (current) drug therapy: Secondary | ICD-10-CM | POA: Diagnosis not present

## 2018-05-21 DIAGNOSIS — F17210 Nicotine dependence, cigarettes, uncomplicated: Secondary | ICD-10-CM | POA: Diagnosis not present

## 2018-05-21 DIAGNOSIS — I959 Hypotension, unspecified: Secondary | ICD-10-CM

## 2018-05-21 DIAGNOSIS — R69 Illness, unspecified: Secondary | ICD-10-CM | POA: Diagnosis present

## 2018-05-21 LAB — URINALYSIS, COMPLETE (UACMP) WITH MICROSCOPIC
BILIRUBIN URINE: NEGATIVE
Bacteria, UA: NONE SEEN
GLUCOSE, UA: NEGATIVE mg/dL
Hgb urine dipstick: NEGATIVE
KETONES UR: NEGATIVE mg/dL
Leukocytes, UA: NEGATIVE
NITRITE: NEGATIVE
PH: 5 (ref 5.0–8.0)
Protein, ur: NEGATIVE mg/dL
SPECIFIC GRAVITY, URINE: 1.016 (ref 1.005–1.030)

## 2018-05-21 LAB — COMPREHENSIVE METABOLIC PANEL
ALT: 15 U/L (ref 0–44)
AST: 30 U/L (ref 15–41)
Albumin: 3.9 g/dL (ref 3.5–5.0)
Alkaline Phosphatase: 53 U/L (ref 38–126)
Anion gap: 8 (ref 5–15)
BUN: 21 mg/dL — AB (ref 6–20)
CALCIUM: 9.3 mg/dL (ref 8.9–10.3)
CO2: 29 mmol/L (ref 22–32)
Chloride: 103 mmol/L (ref 98–111)
Creatinine, Ser: 1.96 mg/dL — ABNORMAL HIGH (ref 0.61–1.24)
GFR calc Af Amer: 41 mL/min — ABNORMAL LOW (ref >60)
GFR calc non Af Amer: 36 mL/min — ABNORMAL LOW (ref >60)
GLUCOSE: 96 mg/dL (ref 70–99)
POTASSIUM: 3.9 mmol/L (ref 3.5–5.1)
Sodium: 140 mmol/L (ref 135–145)
Total Bilirubin: 1.1 mg/dL (ref 0.3–1.2)
Total Protein: 7.2 g/dL (ref 6.5–8.1)

## 2018-05-21 LAB — TROPONIN I

## 2018-05-21 LAB — LIPID PANEL
Cholesterol: 145 mg/dL (ref 0–200)
HDL: 45 mg/dL (ref >40)
LDL CALC: 87 mg/dL (ref 0–99)
Total CHOL/HDL Ratio: 3.2 RATIO
Triglycerides: 66 mg/dL (ref <150)
VLDL: 13 mg/dL (ref 0–40)

## 2018-05-21 LAB — CBC WITH DIFFERENTIAL/PLATELET
BASOS PCT: 1 %
Basophils Absolute: 0 10*3/uL (ref 0–0.1)
Eosinophils Absolute: 0 10*3/uL (ref 0–0.7)
Eosinophils Relative: 1 %
HEMATOCRIT: 35.6 % — AB (ref 40.0–52.0)
HEMOGLOBIN: 12.4 g/dL — AB (ref 13.0–18.0)
LYMPHS PCT: 47 %
Lymphs Abs: 2.3 10*3/uL (ref 1.0–3.6)
MCH: 32.6 pg (ref 26.0–34.0)
MCHC: 34.9 g/dL (ref 32.0–36.0)
MCV: 93.6 fL (ref 80.0–100.0)
Monocytes Absolute: 0.5 10*3/uL (ref 0.2–1.0)
Monocytes Relative: 10 %
NEUTROS ABS: 2 10*3/uL (ref 1.4–6.5)
NEUTROS PCT: 41 %
Platelets: 257 10*3/uL (ref 150–440)
RBC: 3.81 MIL/uL — ABNORMAL LOW (ref 4.40–5.90)
RDW: 12.1 % (ref 11.5–14.5)
WBC: 4.7 10*3/uL (ref 3.8–10.6)

## 2018-05-21 LAB — PROTIME-INR
INR: 1
Prothrombin Time: 13.1 seconds (ref 11.4–15.2)

## 2018-05-21 LAB — APTT: aPTT: 33 seconds (ref 24–36)

## 2018-05-21 SURGERY — CORONARY/GRAFT ACUTE MI REVASCULARIZATION
Anesthesia: Moderate Sedation

## 2018-05-21 MED ORDER — SODIUM CHLORIDE 0.9 % IV BOLUS: 1000.0000 mL | Freq: Once | INTRAVENOUS | Status: DC

## 2018-05-21 MED ORDER — SODIUM CHLORIDE 0.9 % IV SOLN
Freq: Once | INTRAVENOUS | Status: AC
  Administered 2018-05-21: 1000 mL via INTRAVENOUS

## 2018-05-21 NOTE — ED Notes (Signed)
Pt alert and oriented X4, active, cooperative, pt in NAD. RR even and unlabored, color WNL.  Pt informed to return if any life threatening symptoms occur.  Discharge and followup instructions reviewed.  

## 2018-05-21 NOTE — ED Notes (Signed)
[name] called and states that [name] will be coming to pick up patient.

## 2018-05-21 NOTE — ED Notes (Signed)
Attempted to call group home at [phone], no answer. Unable to leave message.

## 2018-05-21 NOTE — Discharge Instructions (Addendum)
As we discussed please discontinue your blood pressure medications Norvasc, hydrochlorothiazide, lisinopril.  Do not take these medications until you see your primary care doctor next week.  Please drink plenty of fluids.  Return to the emergency department for any further weakness, development of any chest pain, or any other symptom personally concerning to yourself.

## 2018-05-21 NOTE — ED Notes (Signed)
Pt attempted to urinate, unable to provide sample at this time. 

## 2018-05-21 NOTE — ED Notes (Signed)
Dr. Arida at bedside 

## 2018-05-21 NOTE — ED Triage Notes (Signed)
To ER via [facility] c/o "not feeling well" X 2 days. Pt c/o generalized weakness for past 2 days. EDP at bedside currently. CBG WNL per EMS. Low BP per EMS. Code STEMI activated in transfer with EMS by EMS. 20G to L forearm started by EMS. 500cc NS infused by EMS.

## 2018-05-21 NOTE — Progress Notes (Signed)
Chaplain received PG for code stemi. Chaplain went to room and care team was working with patient and Chaplain made pastoral presences known. Doctor was trying to decided if he would cancel code stemi. Chaplain said silent prayer for patient and staff. Chaplain ask was there anything else she could do at this time. Nurse said no and Chaplain said please page if you need to.

## 2018-05-21 NOTE — ED Provider Notes (Signed)
[facility] Emergency Department Provider Note  Time seen: 4:13 PM  I have reviewed the triage vital signs and the nursing notes.   HISTORY  Chief Complaint "Not feeling well"    HPI Roberto Vasquez is a 57 y.o. male with a past medical history of anxiety, hepatitis, hypertension, schizophrenia, presents to the emergency department with a general complaint of not feeling well.  According to EMS they were called out because the patient states for the past 2 to 3 days he is just not been feeling well.  Upon arrival patient noted to be hypotensive in the 80s systolic.  Patient denies any chest pain or shortness of breath.  Largely negative review of symptoms besides "just not feeling well."  Denies any nausea vomiting diaphoresis abdominal pain diarrhea dysuria or known fever.   Past Medical History:  Diagnosis Date  . Anxiety   . Hepatitis C   . Hypertension   . Schizophrenia (HCC)   . Seizures [facility])     Patient Active Problem List   Diagnosis Date Noted  . UTI (urinary tract infection) 05/14/2018  . Seizure (HCC) 04/06/2018  . Tobacco use disorder 02/23/2017  . Schizophrenia (HCC) 02/20/2017  . Noncompliance 02/20/2017  . HTN (hypertension) 03/01/2015    Past Surgical History:  Procedure Laterality Date  . ABDOMINAL SURGERY    . VENTRAL HERNIA REPAIR N/A 10/20/2016   Procedure: HERNIA REPAIR VENTRAL ADULT;  Surgeon: Lattie Haw, MD;  Location: [facility] ORS;  Service: General;  Laterality: N/A;    Prior to Admission medications   Medication Sig Start Date End Date Taking? Authorizing Provider  ARIPiprazole ER (ABILIFY MAINTENA) 300 MG PRSY prefilled syringe Inject 300 mg into the muscle every 28 (twenty-eight) days.    [provider]  cephALEXin (KEFLEX) 500 MG capsule Take 1 capsule (500 mg total) by mouth 3 (three) times daily. 05/16/18   Milagros Loll, MD  QUEtiapine (SEROQUEL) 200 MG tablet Take 1 tablet (200 mg total) by mouth  at bedtime. 05/16/18   Milagros Loll, MD  traZODone (DESYREL) 100 MG tablet Take 1 tablet (100 mg total) by mouth at bedtime. 02/26/17   Jimmy Footman, MD    No Known Allergies  Family History  Problem Relation Age of Onset  . Hypertension Mother     Social History Social History   Tobacco Use  . Smoking status: Current Every Day Smoker    Packs/day: 1.00    Years: 40.00    Pack years: 40.00    Types: Cigarettes  . Smokeless tobacco: Never Used  Substance Use Topics  . Alcohol use: No  . Drug use: No    Review of Systems Constitutional: Negative for fever.  Positive for generalized weakness. Eyes: Negative for visual complaints ENT: Negative for recent illness/congestion Cardiovascular: Negative for chest pain. Respiratory: Negative for shortness of breath. Gastrointestinal: Negative for abdominal pain, vomiting and diarrhea. Genitourinary: Negative for urinary compaints Musculoskeletal: Negative for musculoskeletal complaints Skin: Negative for skin complaints  Neurological: Negative for headache All other ROS negative  ____________________________________________   PHYSICAL EXAM:  Constitutional: Alert and oriented to person place time and situation.  Able to answer questions and follow commands.  Possible mild intellectual delay. Eyes: Normal exam ENT   Head: Normocephalic and atraumatic.   Mouth/Throat: Mucous membranes are moist. Cardiovascular: Normal rate, regular rhythm. No murmur Respiratory: Normal respiratory effort without tachypnea nor retractions. Breath sounds are clear  Gastrointestinal: Soft and nontender. No distention.   Musculoskeletal: Nontender  with normal range of motion in all extremities. No lower extremity tenderness or edema. Neurologic:  Normal speech and language. No gross focal neurologic deficits  Skin:  Skin is warm, dry and intact.  Psychiatric: Mood and affect are  normal.  ____________________________________________    EKG  EKG reviewed and interpreted by myself shows normal sinus rhythm at 72 bpm with a narrow QRS, normal axis, normal intervals, patient does have slight ST elevation fairly diffusely in all leads no obvious reciprocal's at this time.  Differential would include MI, pericarditis, early repolarization.  ____________________________________________   INITIAL IMPRESSION / ASSESSMENT AND PLAN / ED COURSE  Pertinent labs & imaging results that were available during my care of the patient were reviewed by me and considered in my medical decision making (see chart for details).  Patient presents to the emergency department with complaints of generally not feeling well for the past 2 to 3 days.  EMS brought the patient via emergency traffic for STEMI concerns.  Activated STEMI from the field.  Patient denies any chest pain or trouble breathing at any point.  Patient's EKG does show ST elevation fairly diffusely it is mild and I do not see any obvious reciprocal changes.  Differential would include MI, pericarditis, early repolarization.  I discussed the patient with cardiology as the patient is chest pain-free with no shortness of breath we will hold off on cardiac catheterization at this point and proceed with medical work-up including lab work.  We will dose IV fluids.  Differential would include infectious etiology metabolic or electrolyte abnormality, renal failure/insufficiency.  Patient's labs are resulted largely at baseline, and creatinine is slightly elevated but not far off from baseline.  H&H is stable.  Troponin is negative.  I reviewed the patient's records she was recently discharged from the hospital approximately 5 days ago after being diagnosed with urinary tract infection sent home on antibiotics.  We will check a urinalysis on the patient.  I also reviewed the patient's prior blood pressures during his hospitalization which were  around 100 systolic.  He is now up to 95/70 with IV fluids.  Patient's urinalysis is largely normal.  Nurse was able to call the group home the patient has still been receiving all of his blood pressure medications.  During his last hospitalization they had stopped the patient's Norvasc lisinopril and hydrochlorothiazide.  However it appears the patient has still been receiving all these medications at his group home.  I highly suspect the patient's hypotension was what was causing his symptoms.  States he is feeling much better.  Blood pressure is 115/94 after IV fluids.  We will discuss with the group home that the medications for high blood pressure are to be discontinued until the patient can see his doctor next week.  I believe the patient is stable for discharge home given otherwise negative work-up.  ____________________________________________   FINAL CLINICAL IMPRESSION(S) / ED DIAGNOSES  Hypotension Weakness    Minna AntisPaduchowski, Eretria Manternach, MD 05/21/18 [phone]

## 2018-05-21 NOTE — ED Notes (Signed)
Pair of shoes, pair or white socks and gym shorts placed in patient belonging bag.

## 2018-05-21 NOTE — ED Notes (Signed)
[name], group home owner contacted and states that she will send someone to come pick up patient tonight. Instructed to hold BP medications as stated in EDP discharge instructions for patient.

## 2018-07-28 ENCOUNTER — Encounter: Payer: Self-pay | Admitting: *Deleted

## 2018-08-05 ENCOUNTER — Encounter: Payer: Self-pay | Admitting: Urology

## 2018-08-05 ENCOUNTER — Ambulatory Visit (INDEPENDENT_AMBULATORY_CARE_PROVIDER_SITE_OTHER): Payer: Medicare Other | Admitting: Urology

## 2018-08-05 VITALS — BP 120/80 | HR 80 | Ht 72.0 in | Wt 152.6 lb

## 2018-08-05 DIAGNOSIS — R32 Unspecified urinary incontinence: Secondary | ICD-10-CM

## 2018-08-05 LAB — BLADDER SCAN AMB NON-IMAGING

## 2018-08-05 MED ORDER — FESOTERODINE FUMARATE ER 4 MG PO TB24: 4.0000 mg | ORAL_TABLET | Freq: Every day | ORAL | 3 refills | Status: DC

## 2018-08-05 NOTE — Progress Notes (Signed)
08/05/2018 9:50 PM   Roberto Vasquez November 07, 1960 [id number]  Referring provider: Sherrie Vasquez, Fayegh, Roberto Vasquez [address] [place], [place] [address]  Chief Complaint  Patient presents with  . Urinary Incontinence  . Encopresis    HPI: 57 year old male with a history of anxiety and schizophrenia who lives in a group home referred for urinary incontinence.  His caregiver states approximately 2 weeks ago he had onset of both urinary and fecal incontinence.  His fecal incontinence has improved but still occurs intermittently.  He is currently being managed with diapers.  He did not provide a history today and the history was obtained from his caregiver.  There has been no gross hematuria.  No apparent history of previous urologic problems.  He could not void on arrival to the office for a urinalysis and catheterization was attempted however he would not allow.  A bladder scan for volume was 100 mL.   PMH: Past Medical History:  Diagnosis Date  . Anxiety   . Hepatitis C   . Hypertension   . Schizophrenia (HCC)   . Seizures [facility](HCC)     Surgical History: Past Surgical History:  Procedure Laterality Date  . ABDOMINAL SURGERY    . VENTRAL HERNIA REPAIR N/A 10/20/2016   Procedure: HERNIA REPAIR VENTRAL ADULT;  Surgeon: Lattie Hawichard E Cooper, Roberto Vasquez;  Location: [facility] ORS;  Service: General;  Laterality: N/A;    Home Medications:  Allergies as of 08/05/2018   No Known Allergies     Medication List        Accurate as of 08/05/18  9:50 PM. Always use your most recent med list.          ABILIFY MAINTENA 300 MG Prsy prefilled syringe Generic drug:  ARIPiprazole ER Inject 300 mg into the muscle every 28 (twenty-eight) days.   amLODipine 10 MG tablet Commonly known as:  NORVASC Take 10 mg by mouth daily.   cephALEXin 500 MG capsule Commonly known as:  KEFLEX Take 1 capsule (500 mg total) by mouth 3 (three) times daily.   fesoterodine 4 MG Tb24 tablet Commonly known as:  TOVIAZ Take 1  tablet (4 mg total) by mouth daily.   hydrochlorothiazide 12.5 MG capsule Commonly known as:  MICROZIDE Take 12.5 mg by mouth daily.   lisinopril 20 MG tablet Commonly known as:  PRINIVIL,ZESTRIL Take 20 mg by mouth every morning.   QUEtiapine 200 MG tablet Commonly known as:  SEROQUEL Take 1 tablet (200 mg total) by mouth at bedtime.   traZODone 100 MG tablet Commonly known as:  DESYREL Take 1 tablet (100 mg total) by mouth at bedtime.       Allergies: No Known Allergies  Family History: Family History  Problem Relation Age of Onset  . Hypertension Mother     Social History:  reports that he has been smoking cigarettes. He has a 40.00 pack-year smoking history. He has never used smokeless tobacco. He reports that he does not drink alcohol or use drugs.  ROS: UROLOGY Frequent Urination?: Yes Hard to postpone urination?: No Burning/pain with urination?: No Get up at night to urinate?: No Leakage of urine?: No Urine stream starts and stops?: No Trouble starting stream?: No Do you have to strain to urinate?: Yes Blood in urine?: No Urinary tract infection?: No Sexually transmitted disease?: No Injury to kidneys or bladder?: No Painful intercourse?: No Weak stream?: No Erection problems?: No Penile pain?: No  Gastrointestinal Nausea?: Yes Vomiting?: No Indigestion/heartburn?: No Diarrhea?: Yes Constipation?: No  Constitutional  Fever: No Night sweats?: No Weight loss?: Yes Fatigue?: No  Skin Skin rash/lesions?: No Itching?: No  Eyes Blurred vision?: No Double vision?: No  Ears/Nose/Throat Sore throat?: No Sinus problems?: No  Hematologic/Lymphatic Swollen glands?: No Easy bruising?: No  Cardiovascular Leg swelling?: No Chest pain?: No  Respiratory Cough?: No Shortness of breath?: No  Endocrine Excessive thirst?: No  Musculoskeletal Back pain?: No Joint pain?: No  Neurological Headaches?: No Dizziness?:  No  Psychologic Depression?: No Anxiety?: No  Physical Exam: BP 120/80 (BP Location: Left Arm, Patient Position: Sitting, Cuff Size: Normal)   Pulse 80   Ht 6' (1.829 m)   Wt 152 lb 9.6 oz (69.2 kg)   BMI 20.70 kg/m   Constitutional:  Alert, No acute distress. HEENT: [place] AT, moist mucus membranes.  Trachea midline, no masses. Cardiovascular: No clubbing, cyanosis, or edema. Respiratory: Normal respiratory effort, no increased work of breathing. GI: Abdomen is soft, nontender, nondistended, no abdominal masses GU: No CVA tenderness  Assessment & Plan:   57 year old male with urinary and incontinence.  There is no evidence of retention/overflow incontinence.  Will give a trial of fesoterodine.  Recommend follow-up nurse visit 1 month for a bladder scan for residual.  With acute onset of both fecal and urinary incontinence would recommend a neurologic evaluation.   Riki Altes, Roberto Vasquez  [facility] [address], [address] [place], [place] [address] [phone]

## 2018-08-25 ENCOUNTER — Telehealth: Payer: Self-pay | Admitting: Urology

## 2018-08-25 NOTE — Telephone Encounter (Signed)
Spoke with Roberto Vasquez she states they already have a company in mind that they want to use, informed her that they would need to fax the request to us. Supplied her with office fax number. She had no additional questions. Will await fax

## 2018-08-25 NOTE — Telephone Encounter (Signed)
Roberto Vasquez called office asking for rx for depends, sheets and gloves. Please advise [phone]

## 2018-09-03 ENCOUNTER — Ambulatory Visit: Payer: Self-pay | Admitting: Urology

## 2018-09-03 NOTE — Progress Notes (Incomplete)
09/03/2018 7:27 AM   Roberto Vasquez June 18, 1961 [id number]  Referring provider: Sherrie MustacheJadali, Fayegh, MD [address] [place], [place] [address]  No chief complaint on file.   HPI: Roberto Vasquez is a 57 yo M with a history of anxiety and schizophrenia returns today for a 1 month follow-up for the evaluation and management of urinary incontinence. He was accompanied by a caregiver who spoke on his behalf. His last visit with us was on 08/05/2018.    PMH: Past Medical History:  Diagnosis Date   Anxiety    Hepatitis C    Hypertension    Schizophrenia (HCC)    Seizures (HCC)     Surgical History: Past Surgical History:  Procedure Laterality Date   ABDOMINAL SURGERY     VENTRAL HERNIA REPAIR N/A 10/20/2016   Procedure: HERNIA REPAIR VENTRAL ADULT;  Surgeon: Lattie Hawichard E Cooper, MD;  Location: [facility] ORS;  Service: General;  Laterality: N/A;    Home Medications:  Allergies as of 09/03/2018   No Known Allergies     Medication List       Accurate as of September 03, 2018  7:27 AM. Always use your most recent med list.        ABILIFY MAINTENA 300 MG Prsy prefilled syringe Generic drug:  ARIPiprazole ER Inject 300 mg into the muscle every 28 (twenty-eight) days.   amLODipine 10 MG tablet Commonly known as:  NORVASC Take 10 mg by mouth daily.   cephALEXin 500 MG capsule Commonly known as:  KEFLEX Take 1 capsule (500 mg total) by mouth 3 (three) times daily.   fesoterodine 4 MG Tb24 tablet Commonly known as:  TOVIAZ Take 1 tablet (4 mg total) by mouth daily.   hydrochlorothiazide 12.5 MG capsule Commonly known as:  MICROZIDE Take 12.5 mg by mouth daily.   lisinopril 20 MG tablet Commonly known as:  PRINIVIL,ZESTRIL Take 20 mg by mouth every morning.   QUEtiapine 200 MG tablet Commonly known as:  SEROQUEL Take 1 tablet (200 mg total) by mouth at bedtime.   traZODone 100 MG tablet Commonly known as:  DESYREL Take 1 tablet (100 mg total) by mouth  at bedtime.       Allergies: No Known Allergies  Family History: Family History  Problem Relation Age of Onset   Hypertension Mother     Social History:  reports that he has been smoking cigarettes. He has a 40.00 pack-year smoking history. He has never used smokeless tobacco. He reports that he does not drink alcohol or use drugs.  ROS:                                        Physical Exam: There were no vitals taken for this visit.  Constitutional: Well nourished. Alert and oriented, No acute distress. HEENT: [place] AT, moist mucus membranes. Trachea midline, no masses. Cardiovascular: No clubbing, cyanosis, or edema. Respiratory: Normal respiratory effort, no increased work of breathing. GI: Abdomen is soft, non tender, non distended, no abdominal masses. Liver and spleen not palpable.  No hernias appreciated.  Stool sample for occult testing is not indicated.   GU: No CVA tenderness.  No bladder fullness or masses.  Patient with circumcised/uncircumcised phallus. ***Foreskin easily retracted***  Urethral meatus is patent.  No penile discharge. No penile lesions or rashes. Scrotum without lesions, cysts, rashes and/or edema.  Testicles are located scrotally bilaterally. No masses  are appreciated in the testicles. Left and right epididymis are normal. Rectal: Patient with  normal sphincter tone. Anus and perineum without scarring or rashes. No rectal masses are appreciated. Prostate is approximately *** grams, *** nodules are appreciated. Seminal vesicles are normal. Skin: No rashes, bruises or suspicious lesions. Lymph: No cervical or inguinal adenopathy. Neurologic: Grossly intact, no focal deficits, moving all 4 extremities. Psychiatric: Normal mood and affect.  Pertinent Imaging: *** No results found for this or any previous visit. No results found for this or any previous visit. No results found for this or any previous visit. No results found for  this or any previous visit. No results found for this or any previous visit. No results found for this or any previous visit. No results found for this or any previous visit. No results found for this or any previous visit.  Assessment & Plan:   1. Urinary incontinence No evidence of retention/overflow incontinence Trial of fesoterodine was give  Acute onset of both fecal and urinary incontinence; recommend neurologic evaluation    No follow-ups on file.  [facility] [address] [place], [place] [address] [phone]  I, Donne Hazel, am acting as a Neurosurgeon for [employer],  {Add Scribe Attestation Statement}

## 2018-10-02 NOTE — Progress Notes (Incomplete)
10/02/2018  4:10 PM   Roberto Vasquez [birth year] [id number]  Referring provider: Sherrie Mustache, MD [address] [place], [place] [address]  No chief complaint on file.   HPI: Roberto Vasquez is a 58 y.o. male African American with a history of anxiety and schizophrenia who lives in a group home who presents today for a follow-up bladder scan.  Background History Patient was first seen by Dr. Lonna Cobb on 08/05/2018, having been referred for urinary incontinence.  His caregiver at that time had stated that approximately 2 weeks prior (around 07/23/2018) he had onset of both urinary and fecal incontinence, of which the fecal incontinence had improved though with intermittent recurrence.  At that time, symptoms were being managed with diapers.  He did not provide a history on his 08/05/2018 visit, and it was obtained from his caregiver.  There had been no gross hematuria and no apparent history of previous urologic problems.  On that visit, he could not void on arrival to the office for a urinalysis and while catheterization was attempted, he did not allow.  The bladder scan for volume on that date was 100 mL.   ***     PMH: Past Medical History:  Diagnosis Date   Anxiety    Hepatitis C    Hypertension    Schizophrenia (HCC)    Seizures (HCC)     Surgical History: Past Surgical History:  Procedure Laterality Date   ABDOMINAL SURGERY     VENTRAL HERNIA REPAIR N/A 10/20/2016   Procedure: HERNIA REPAIR VENTRAL ADULT;  Surgeon: Lattie Haw, MD;  Location: [facility] ORS;  Service: General;  Laterality: N/A;    Home Medications:  Allergies as of 10/05/2018   No Known Allergies     Medication List       Accurate as of October 02, 2018  4:10 PM. Always use your most recent med list.        ABILIFY MAINTENA 300 MG Prsy prefilled syringe Generic drug:  ARIPiprazole ER Inject 300 mg into the muscle every 28 (twenty-eight) days.   amLODipine 10 MG  tablet Commonly known as:  NORVASC Take 10 mg by mouth daily.   cephALEXin 500 MG capsule Commonly known as:  KEFLEX Take 1 capsule (500 mg total) by mouth 3 (three) times daily.   fesoterodine 4 MG Tb24 tablet Commonly known as:  TOVIAZ Take 1 tablet (4 mg total) by mouth daily.   hydrochlorothiazide 12.5 MG capsule Commonly known as:  MICROZIDE Take 12.5 mg by mouth daily.   lisinopril 20 MG tablet Commonly known as:  PRINIVIL,ZESTRIL Take 20 mg by mouth every morning.   QUEtiapine 200 MG tablet Commonly known as:  SEROQUEL Take 1 tablet (200 mg total) by mouth at bedtime.   traZODone 100 MG tablet Commonly known as:  DESYREL Take 1 tablet (100 mg total) by mouth at bedtime.       Allergies: No Known Allergies  Family History: Family History  Problem Relation Age of Onset   Hypertension Mother     Social History:  reports that he has been smoking cigarettes. He has a 40.00 pack-year smoking history. He has never used smokeless tobacco. He reports that he does not drink alcohol or use drugs.  ROS:                                        Physical Exam:  There were no vitals taken for this visit.  Constitutional:  Well nourished. Alert and oriented, No acute distress. {HEENT: [place] AT, moist mucus membranes.  Trachea midline, no masses.} Cardiovascular: No clubbing, cyanosis, or edema. Respiratory: Normal respiratory effort, no increased work of breathing. {GI: Abdomen is soft, non tender, non distended, no abdominal masses. Liver and spleen not palpable.  No hernias appreciated.  Stool sample for occult testing is not indicated.   GU: No CVA tenderness.  No bladder fullness or masses.  Patient with circumcised/uncircumcised phallus. ***Foreskin easily retracted***  Urethral meatus is patent.  No penile discharge. No penile lesions or rashes. Scrotum without lesions, cysts, rashes and/or edema.  Testicles are located scrotally bilaterally. No  masses are appreciated in the testicles. Left and right epididymis are normal. Rectal: Patient with  normal sphincter tone. Anus and perineum without scarring or rashes. No rectal masses are appreciated. Prostate is approximately *** grams, *** nodules are appreciated. Seminal vesicles are normal.} Skin: No rashes, bruises or suspicious lesions. {Lymph: No cervical or inguinal adenopathy.} Neurologic: Grossly intact, no focal deficits, moving all 4 extremities. Psychiatric: {Normal mood and affect.}  Laboratory Data: Lab Results  Component Value Date   WBC 4.7 05/21/2018   HGB 12.4 (L) 05/21/2018   HCT 35.6 (L) 05/21/2018   MCV 93.6 05/21/2018   PLT 257 05/21/2018    Lab Results  Component Value Date   CREATININE 1.96 (H) 05/21/2018    No results found for: PSA  No results found for: TESTOSTERONE  Lab Results  Component Value Date   HGBA1C 4.4 (L) 02/22/2017    Lab Results  Component Value Date   TSH 0.474 02/22/2017       Component Value Date/Time   CHOL 145 05/21/2018 1615   HDL 45 05/21/2018 1615   CHOLHDL 3.2 05/21/2018 1615   VLDL 13 05/21/2018 1615   LDLCALC 87 05/21/2018 1615    Lab Results  Component Value Date   AST 30 05/21/2018   Lab Results  Component Value Date   ALT 15 05/21/2018   No components found for: ALKALINEPHOPHATASE No components found for: BILIRUBINTOTAL  No results found for: ESTRADIOL  Urinalysis    Component Value Date/Time   COLORURINE YELLOW (A) 05/21/2018 1742   APPEARANCEUR CLEAR (A) 05/21/2018 1742   LABSPEC 1.016 05/21/2018 1742   PHURINE 5.0 05/21/2018 1742   GLUCOSEU NEGATIVE 05/21/2018 1742   HGBUR NEGATIVE 05/21/2018 1742   BILIRUBINUR NEGATIVE 05/21/2018 1742   KETONESUR NEGATIVE 05/21/2018 1742   PROTEINUR NEGATIVE 05/21/2018 1742   NITRITE NEGATIVE 05/21/2018 1742   LEUKOCYTESUR NEGATIVE 05/21/2018 1742    I have reviewed the labs.  Assessment & Plan:  ***  No follow-ups on file.  These notes  generated with voice recognition software. I apologize for typographical errors.  Duanne MoronKatharine Samson  [facility] [address]  [address] [place], [place] [address] [phone]  I, Duanne MoronKatharine Samson, am acting as a Neurosurgeonscribe for [employer]annon McGowan, PA-C.   {Add Scribe Attestation Statement}

## 2018-10-05 ENCOUNTER — Ambulatory Visit: Payer: Medicare Other | Admitting: Urology

## 2018-10-05 ENCOUNTER — Encounter: Payer: Self-pay | Admitting: Urology

## 2018-10-08 ENCOUNTER — Telehealth: Payer: Self-pay | Admitting: Urology

## 2018-10-08 NOTE — Telephone Encounter (Signed)
Would you call Mr. Roberto Vasquez and have him reschedule his missed appointment from 10/05/2018?

## 2018-10-12 ENCOUNTER — Other Ambulatory Visit: Payer: Self-pay

## 2018-10-12 ENCOUNTER — Emergency Department
Admission: EM | Admit: 2018-10-12 | Discharge: 2018-10-12 | Disposition: A | Discharge status: Home / Self Care | Payer: Medicare Other | Attending: Emergency Medicine | Admitting: Emergency Medicine

## 2018-10-12 DIAGNOSIS — R4689 Other symptoms and signs involving appearance and behavior: Secondary | ICD-10-CM | POA: Diagnosis not present

## 2018-10-12 DIAGNOSIS — F17210 Nicotine dependence, cigarettes, uncomplicated: Secondary | ICD-10-CM | POA: Insufficient documentation

## 2018-10-12 DIAGNOSIS — I10 Essential (primary) hypertension: Secondary | ICD-10-CM | POA: Diagnosis not present

## 2018-10-12 DIAGNOSIS — X31XXXA Exposure to excessive natural cold, initial encounter: Secondary | ICD-10-CM | POA: Insufficient documentation

## 2018-10-12 DIAGNOSIS — Z008 Encounter for other general examination: Secondary | ICD-10-CM | POA: Diagnosis present

## 2018-10-12 LAB — GLUCOSE, CAPILLARY: GLUCOSE-CAPILLARY: 122 mg/dL — AB (ref 70–99)

## 2018-10-12 NOTE — Discharge Instructions (Addendum)
These make sure to monitor Roberto Vasquez, so he is unable to run away.  Return to the emergency department for any concerns including changes in mental status, fever, or any other symptoms concerning to you.

## 2018-10-12 NOTE — ED Provider Notes (Signed)
[facility] Emergency Department Provider Note  ____________________________________________  Time seen: Approximately 12:38 PM  I have reviewed the triage vital signs and the nursing notes.   HISTORY  Chief Complaint cold exposure    HPI Roberto Vasquez is a 58 y.o. male with a history of schizophrenia, HTN, seizures, who ran away from his group home and was found by police, concern for hypothermia.  The patient reports that he left his group home yesterday "because I do not like it there."  He then slept outdoors overnight, and was found by police eating at a restaurant.  They were unable to get a temperature reading, so he was brought to the emergency department for further evaluation.  Here, the patient's original temperature is 97.5 on arrival, and I have repeated it it is 97.7.  Patient has no complaints except he states he is "shaky."  Past Medical History:  Diagnosis Date  . Anxiety   . Hepatitis C   . Hypertension   . Schizophrenia (HCC)   . Seizures [facility])     Patient Active Problem List   Diagnosis Date Noted  . UTI (urinary tract infection) 05/14/2018  . Seizure (HCC) 04/06/2018  . Tobacco use disorder 02/23/2017  . Schizophrenia (HCC) 02/20/2017  . Noncompliance 02/20/2017  . HTN (hypertension) 03/01/2015    Past Surgical History:  Procedure Laterality Date  . ABDOMINAL SURGERY    . VENTRAL HERNIA REPAIR N/A 10/20/2016   Procedure: HERNIA REPAIR VENTRAL ADULT;  Surgeon: Lattie Haw, MD;  Location: [facility] ORS;  Service: General;  Laterality: N/A;    Current Outpatient Rx  . Order #: [id number] Class: Historical Med  . Order #: [id number] Class: Historical Med  . Order #: [id number] Class: Print  . Order #: [id number] Class: Normal  . Order #: [id number] Class: Historical Med  . Order #: [id number] Class: Historical Med  . Order #: [id number] Class: Print  . Order #: [id number] Class: Normal    Allergies Patient has no known  allergies.  Family History  Problem Relation Age of Onset  . Hypertension Mother     Social History Social History   Tobacco Use  . Smoking status: Current Every Day Smoker    Packs/day: 1.00    Years: 40.00    Pack years: 40.00    Types: Cigarettes  . Smokeless tobacco: Never Used  Substance Use Topics  . Alcohol use: No  . Drug use: No    Review of Systems May be limited due to cognitive impairment. Constitutional: No fever/chills.  No lightheadedness or syncope.  Alert and oriented, some impairment due to cognitive impairment. Eyes: No visual changes. ENT: No sore throat. No congestion or rhinorrhea. Cardiovascular: Denies chest pain. Denies palpitations. Respiratory: Denies shortness of breath.  No cough. Gastrointestinal: No abdominal pain.  No nausea, no vomiting.  No diarrhea.  No constipation. Genitourinary: Negative for dysuria. Musculoskeletal: Negative for back pain. Skin: Negative for rash. Neurological: Negative for headaches. No focal numbness, tingling or weakness.     ____________________________________________   PHYSICAL EXAM:  VITAL SIGNS: ED Triage Vitals  Enc Vitals Group     BP 10/12/18 1130 (!) 162/85     Pulse Rate 10/12/18 1130 (!) 117     Resp 10/12/18 1130 19     Temp 10/12/18 1130 97.8 F (36.6 C)     Temp Source 10/12/18 1130 Oral     SpO2 10/12/18 1130 100 %     Weight 10/12/18 1131 180 lb (81.6 kg)     Height  10/12/18 1131 6\' 10"  (2.083 m)     Head Circumference --      Peak Flow --      Pain Score 10/12/18 1131 2     Pain Loc --      Pain Edu? --      Excl. in GC? --     Constitutional: Alert and oriented. Answers questions appropriately. Eyes: Conjunctivae are normal.  EOMI. No scleral icterus. Head: Atraumatic. Nose: No congestion/rhinnorhea. Mouth/Throat: Mucous membranes are moist.  Neck: No stridor.  Supple.   Cardiovascular: Normal rate, regular rhythm. No murmurs, rubs or gallops.  Respiratory: Normal  respiratory effort.  No accessory muscle use or retractions. Lungs CTAB.  No wheezes, rales or ronchi. Gastrointestinal: Soft, nontender and nondistended.  No guarding or rebound.  No peritoneal signs. Musculoskeletal: No LE edema. No ttp in the calves or palpable cords.  Negative Homan's sign. Neurologic:  A&Ox3.  Speech is clear.  Face and smile are symmetric.  EOMI.  Moves all extremities well. Skin:  Skin is warm, dry and intact. No rash noted. Psychiatric: Mood and affect are normal.  Endocrine: BS 122  ____________________________________________   LABS (all labs ordered are listed, but only abnormal results are displayed)  Labs Reviewed - No data to display ____________________________________________  EKG  Not indicated ____________________________________________  RADIOLOGY  No results found.  ____________________________________________   PROCEDURES  Procedure(s) performed: None  Procedures  Critical Care performed: No ____________________________________________   INITIAL IMPRESSION / ASSESSMENT AND PLAN / ED COURSE  Pertinent labs & imaging results that were available during my care of the patient were reviewed by me and considered in my medical decision making (see chart for details).  58 y.o. male with schizophrenia from a group home who slept outdoors last night, concern for hypothermia.  Here, the patient is normothermic and feeling shaky but his blood sugar is normal.  He has no other complaints.  I do not suspect any life-threatening conditions at this time.  He will be discharged back to his group home.  ____________________________________________  FINAL CLINICAL IMPRESSION(S) / ED DIAGNOSES  Final diagnoses:  Behavior involving running away         NEW MEDICATIONS STARTED DURING THIS VISIT:  New Prescriptions   No medications on file      Rockne MenghiniNorman, Anne-Caroline, MD 10/12/18 1241

## 2018-10-12 NOTE — ED Triage Notes (Signed)
Pt comes via [employer] Officer [place] with c/o possible cold exposure. Officer states pt ran away from group home and slept outside in the cold.  Pt went into restaurant this am and they called EMS. Pt states he slept outside and states his bones hurt.  Temp checked and 97.8 at this time. Pt is alert and oriented at this time.

## 2018-10-12 NOTE — ED Notes (Signed)
Pt dressed himself and officer Roberto Vasquez who had brought him in will transport him back to group home, papers given to officer

## 2018-10-12 NOTE — ED Notes (Signed)
Pt ran away from goup home, I asked him if he was being treated nicely at the group home and pt states that they are nice.  He was found in restaurant and there was concern about hypothermia.  Pt appears very well at this time, no distress noted. Will bring pt some food.

## 2018-10-25 NOTE — Progress Notes (Signed)
10/26/2018 9:42 AM   Roberto JosephJerome Walter Mcgivern 09-06-1961 [id number]  Referring provider: Sherrie MustacheJadali, Fayegh, MD [address] [place], [place] [id number]  Chief Complaint  Patient presents with  . Urinary Incontinence    PVR    HPI: Roberto Vasquez is a 58 y.o. male African-American with a history of anxiety, schizophrenia and urinary incontinence and who lives in a group home, who presents for a 1 month follow-up bladder scan with his caregiver, Thelma BargeFrancis.    He was previously seen by Dr. Lonna CobbStoioff on 08/05/2018.  On that visit, he had been experiencing symptoms for approximately two weeks according to his caregiver who provided the history on that visit.  There had been no gross hematuria nor history of urologic problems, but he could not void on arrival to the office for urinalysis and would not allow when catheterization was attempted.  PVR on that visit was 100 mL.  On today's visit (10/26/2018), PVR is 15 mL.  Caregiver reports that he is still experiencing urinary incontinence and fecal incontinence.  It was explained that normally that when a patient is experiencing both, a referal is made to neurology; caregiver is of the opinion it's due to laziness.  Patient refuses cystoscopy; he has however requested a refill of Toviaz.  There was difficulty determining what dosage.  PMH: Past Medical History:  Diagnosis Date  . Anxiety   . Hepatitis C   . Hypertension   . Schizophrenia (HCC)   . Seizures [facility](HCC)     Surgical History: Past Surgical History:  Procedure Laterality Date  . ABDOMINAL SURGERY    . VENTRAL HERNIA REPAIR N/A 10/20/2016   Procedure: HERNIA REPAIR VENTRAL ADULT;  Surgeon: Lattie Hawichard E Cooper, MD;  Location: [facility] ORS;  Service: General;  Laterality: N/A;    Home Medications:  Allergies as of 10/26/2018   No Known Allergies     Medication List       Accurate as of October 26, 2018  9:42 AM. Always use your most recent med list.        ABILIFY MAINTENA 300 MG  Prsy prefilled syringe Generic drug:  ARIPiprazole ER Inject 300 mg into the muscle every 28 (twenty-eight) days.   ABILIFY MAINTENA 300 MG Prsy prefilled syringe Generic drug:  ARIPiprazole ER Inject 300 mg into the muscle.   amLODipine 10 MG tablet Commonly known as:  NORVASC Take 10 mg by mouth daily.   cephALEXin 500 MG capsule Commonly known as:  KEFLEX Take 1 capsule (500 mg total) by mouth 3 (three) times daily.   fesoterodine 4 MG Tb24 tablet Commonly known as:  TOVIAZ Take 1 tablet (4 mg total) by mouth daily.   hydrochlorothiazide 12.5 MG capsule Commonly known as:  MICROZIDE Take 12.5 mg by mouth daily.   lisinopril 20 MG tablet Commonly known as:  PRINIVIL,ZESTRIL Take 20 mg by mouth every morning.   POTASSIUM CHLORIDE PO Take by mouth.   QUEtiapine 200 MG tablet Commonly known as:  SEROQUEL Take 1 tablet (200 mg total) by mouth at bedtime.   traZODone 100 MG tablet Commonly known as:  DESYREL Take 1 tablet (100 mg total) by mouth at bedtime.       Allergies: No Known Allergies  Family History: Family History  Problem Relation Age of Onset  . Hypertension Mother     Social History:  reports that he has been smoking cigarettes. He has a 40.00 pack-year smoking history. He has never used smokeless tobacco. He reports that he  does not drink alcohol or use drugs.  ROS: UROLOGY Frequent Urination?: No Hard to postpone urination?: No Burning/pain with urination?: No Get up at night to urinate?: No Leakage of urine?: No Urine stream starts and stops?: No Trouble starting stream?: No Do you have to strain to urinate?: No Blood in urine?: No Urinary tract infection?: No Sexually transmitted disease?: No Injury to kidneys or bladder?: No Painful intercourse?: No Weak stream?: No Erection problems?: No Penile pain?: No  Gastrointestinal Nausea?: No Vomiting?: No Indigestion/heartburn?: No Diarrhea?: No Constipation?:  No  Constitutional Fever: No Night sweats?: No Weight loss?: Yes Fatigue?: No  Skin Skin rash/lesions?: No Itching?: Yes  Eyes Blurred vision?: No Double vision?: No  Ears/Nose/Throat Sore throat?: No Sinus problems?: No  Hematologic/Lymphatic Swollen glands?: No Easy bruising?: No  Cardiovascular Leg swelling?: No Chest pain?: No  Respiratory Cough?: No Shortness of breath?: No  Endocrine Excessive thirst?: No  Musculoskeletal Back pain?: No Joint pain?: No  Neurological Headaches?: No Dizziness?: No  Psychologic Depression?: No Anxiety?: No  Physical Exam: BP 116/84 (BP Location: Left Arm, Patient Position: Sitting)   Pulse 98   Ht 5\' 11"  (1.803 m) Comment: per pt  Wt 146 lb 9.6 oz (66.5 kg)   BMI 20.45 kg/m   Constitutional:  Well nourished. Alert.   No acute distress. Cardiovascular: No clubbing, cyanosis, or edema. Respiratory: Normal respiratory effort, no increased work of breathing. Skin: No rashes, bruises or suspicious lesions. Neurologic: Grossly intact, no focal deficits, moving all 4 extremities. Psychiatric: Normal mood and affect.  Laboratory Data: Lab Results  Component Value Date   WBC 4.7 05/21/2018   HGB 12.4 (L) 05/21/2018   HCT 35.6 (L) 05/21/2018   MCV 93.6 05/21/2018   PLT 257 05/21/2018    Lab Results  Component Value Date   CREATININE 1.96 (H) 05/21/2018      Component Value Date/Time   CHOL 145 05/21/2018 1615   HDL 45 05/21/2018 1615   CHOLHDL 3.2 05/21/2018 1615   VLDL 13 05/21/2018 1615   LDLCALC 87 05/21/2018 1615    Lab Results  Component Value Date   AST 30 05/21/2018   Lab Results  Component Value Date   ALT 15 05/21/2018   Urinalysis    Component Value Date/Time   COLORURINE YELLOW (A) 05/21/2018 1742   APPEARANCEUR CLEAR (A) 05/21/2018 1742   LABSPEC 1.016 05/21/2018 1742   PHURINE 5.0 05/21/2018 1742   GLUCOSEU NEGATIVE 05/21/2018 1742   HGBUR NEGATIVE 05/21/2018 1742   BILIRUBINUR  NEGATIVE 05/21/2018 1742   KETONESUR NEGATIVE 05/21/2018 1742   PROTEINUR NEGATIVE 05/21/2018 1742   NITRITE NEGATIVE 05/21/2018 1742   LEUKOCYTESUR NEGATIVE 05/21/2018 1742   I have reviewed the labs.  Pertinent Imaging: Results for Roberto Vasquez, Roberto Vasquez (MRN [id number]) as of 10/26/2018 09:14  Ref. Range 08/05/2018 10:33 10/26/2018 09:13  Scan Result Unknown 45ml   Assessment & Plan:  1. Urinary Incontience - PVR is minimal - Caregiver states he is still having urinary incontinence and fecal incontinence - It was recommended that he undergo a neurological evaluation, but caregiver states she believes it is due to laziness - Referral will be made to neurology  2. Encopresis - See above  Return in about 1 year (around 10/27/2019) for PVR .  These notes generated with voice recognition software. I apologize for typographical errors.  Michiel Cowboy, PA-C  [facility] [address]  [address] [place], [place] [address] [phone]  I, Duanne Moron,  am acting as a Neurosurgeon for [employer], PA-C.   I have reviewed the above documentation for accuracy and completeness, and I agree with the above.    Michiel Cowboy, PA-C

## 2018-10-26 ENCOUNTER — Ambulatory Visit (INDEPENDENT_AMBULATORY_CARE_PROVIDER_SITE_OTHER): Payer: Medicare Other | Admitting: Urology

## 2018-10-26 ENCOUNTER — Encounter: Payer: Self-pay | Admitting: Urology

## 2018-10-26 VITALS — BP 116/84 | HR 98 | Ht 71.0 in | Wt 146.6 lb

## 2018-10-26 DIAGNOSIS — R32 Unspecified urinary incontinence: Secondary | ICD-10-CM | POA: Diagnosis not present

## 2018-10-26 DIAGNOSIS — R159 Full incontinence of feces: Secondary | ICD-10-CM

## 2018-10-26 LAB — BLADDER SCAN AMB NON-IMAGING

## 2018-11-04 ENCOUNTER — Encounter: Payer: Self-pay | Admitting: Neurology

## 2019-01-17 ENCOUNTER — Ambulatory Visit: Payer: Self-pay | Admitting: Neurology

## 2019-01-25 IMAGING — CT CT HEAD W/O CM
3 series · 15 of 47 positions shown, 18 images · non-contrast
Comparison: Head CT dated 12/19/2015

CLINICAL DATA: 57-year-old male with altered mental status.

EXAM:
CT HEAD WITHOUT CONTRAST
TECHNIQUE: Contiguous axial images were obtained from the base of the skull
through the vertex without intravenous contrast.

[Series 3: head wo · axial · 0.44mm/px · z∈[-107,+18]mm · 9 of 31 slices shown, 12 images]
[im 3/31  brain]
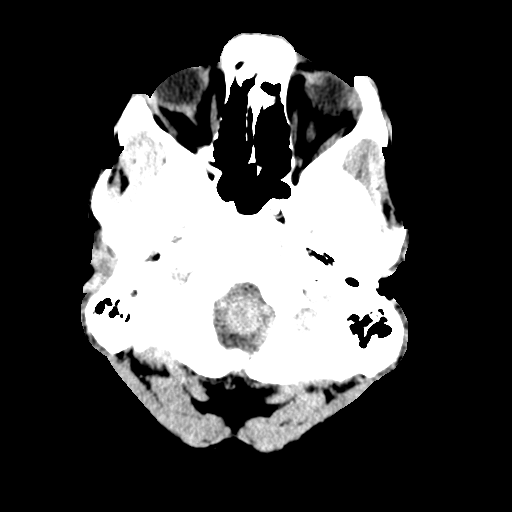
[im 3/31  bone]
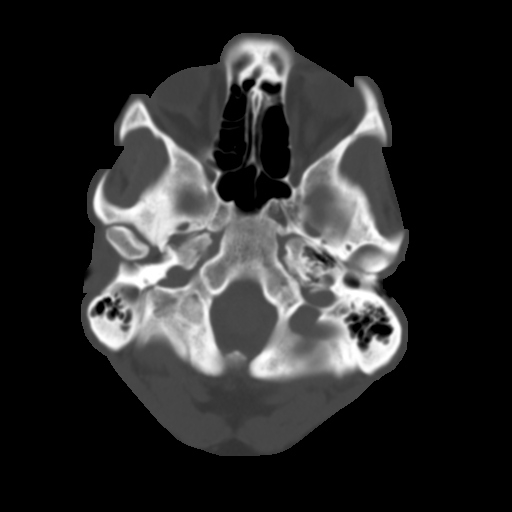
[im 6/31  brain]
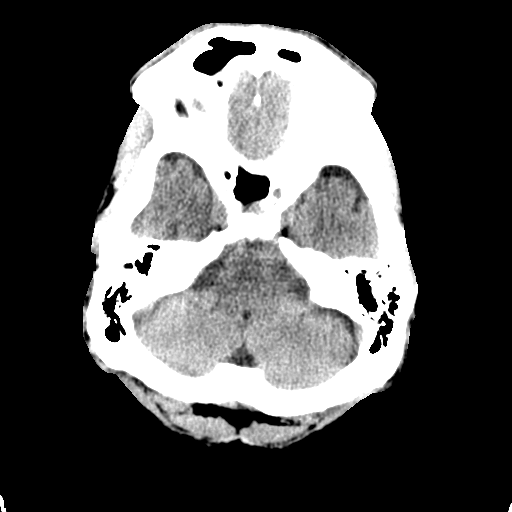
[im 9/31  brain]
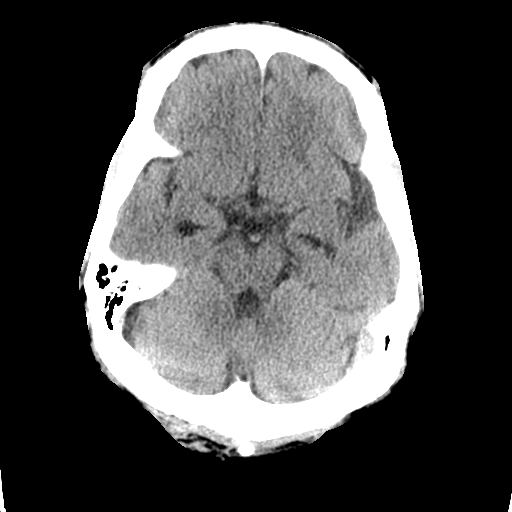
[im 12/31  brain]
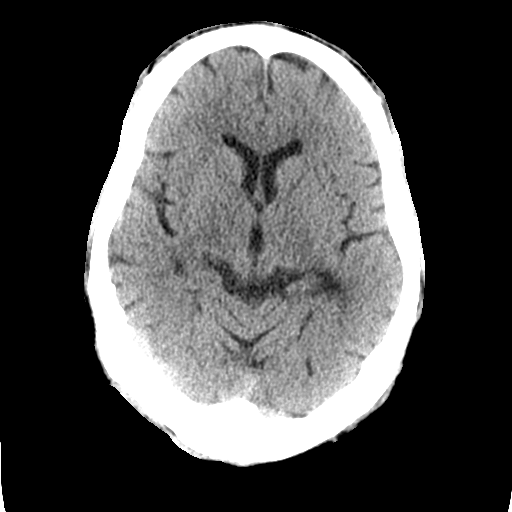
[im 16/31  brain]
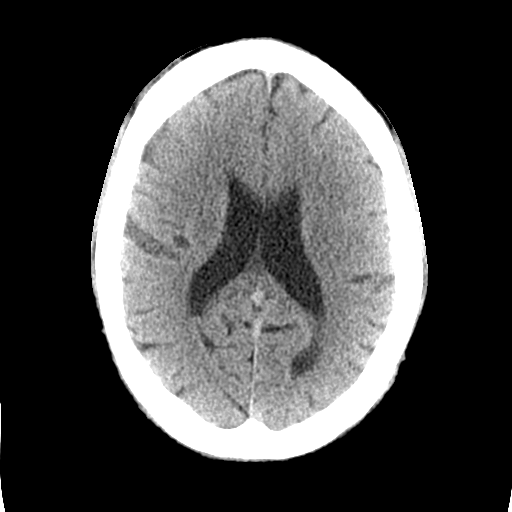
[im 16/31  bone]
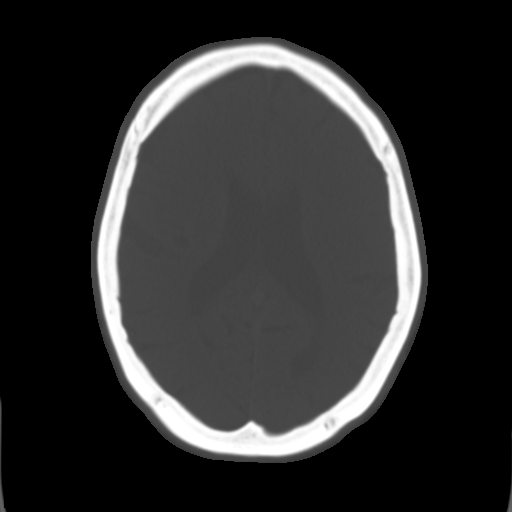
[im 19/31  brain]
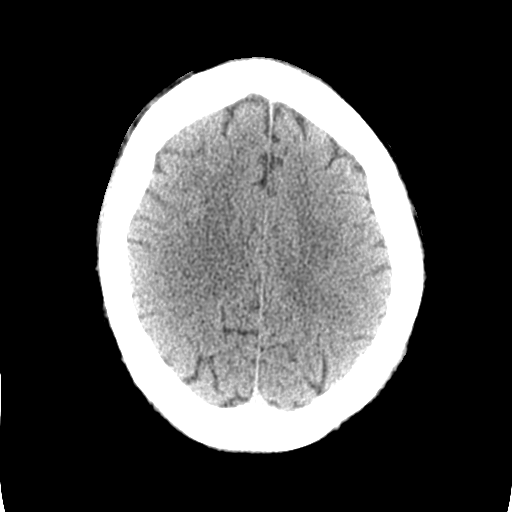
[im 22/31  brain]
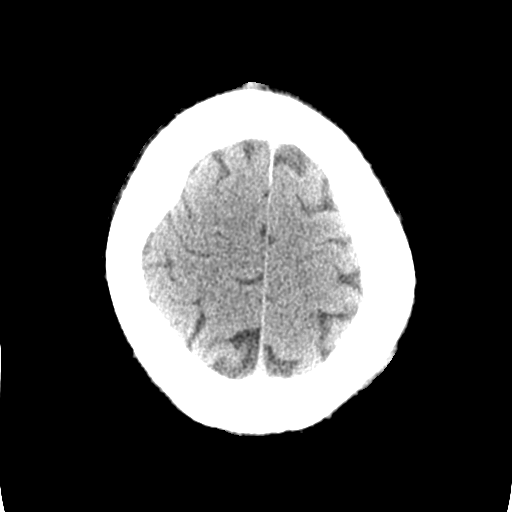
[im 25/31  brain]
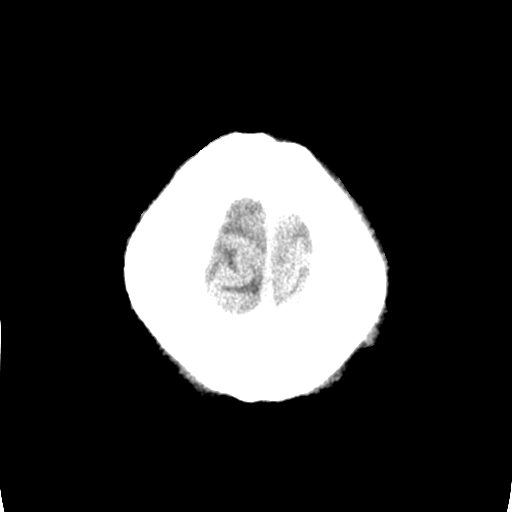
[im 28/31  brain]
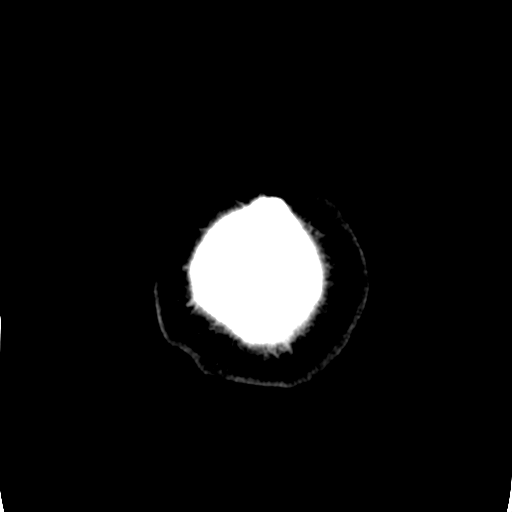
[im 28/31  bone]
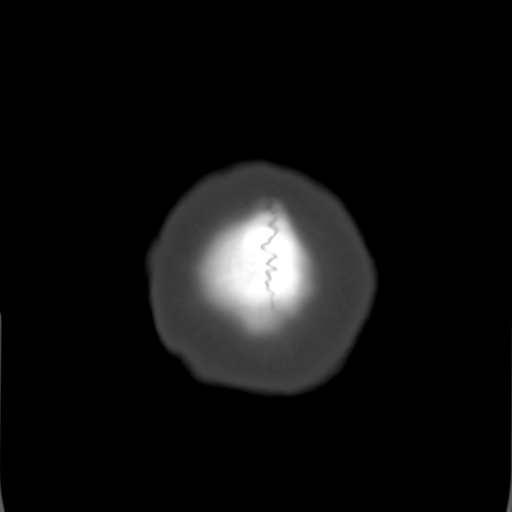

[Series 4: coronal soft tissue · coronal · 0.29mm/px · 3 of 67 slices shown]
[im 23/67  brain]
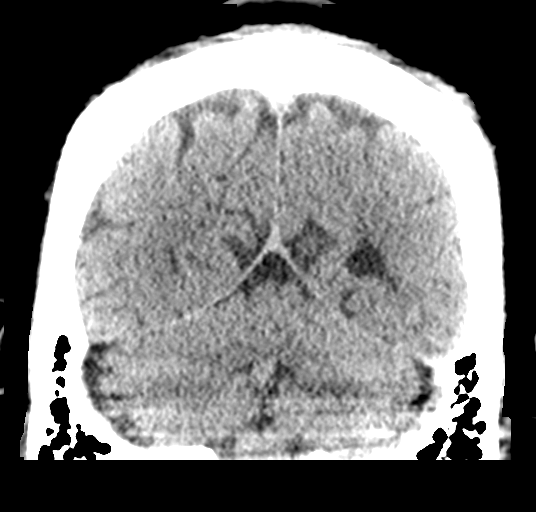
[im 30/67  brain]
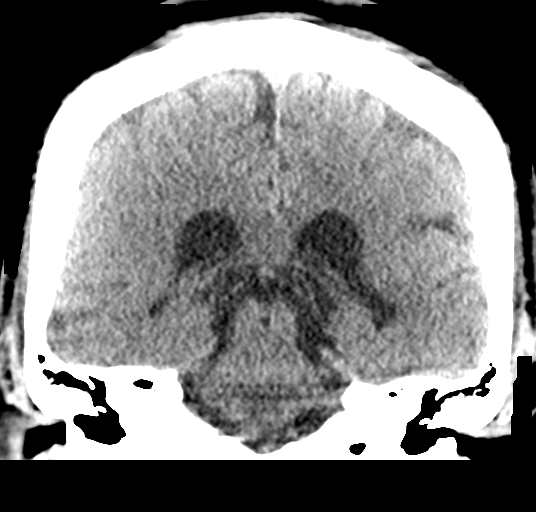
[im 37/67  brain]
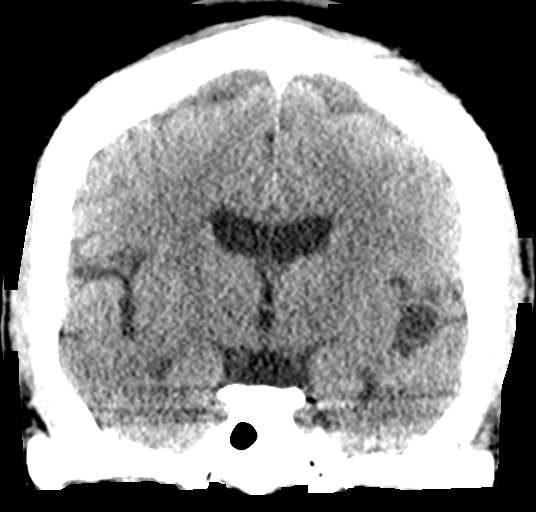

[Series 5: sagittal soft tissue · sagittal · 0.29mm/px · 3 of 53 slices shown]
[im 18/53  brain]
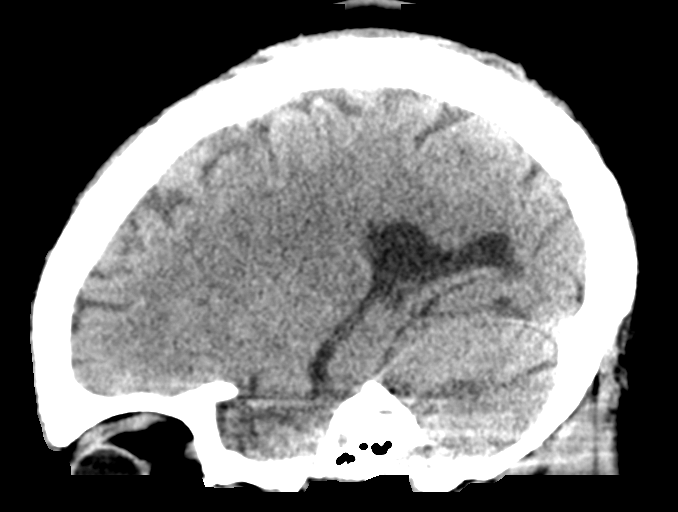
[im 27/53  brain]
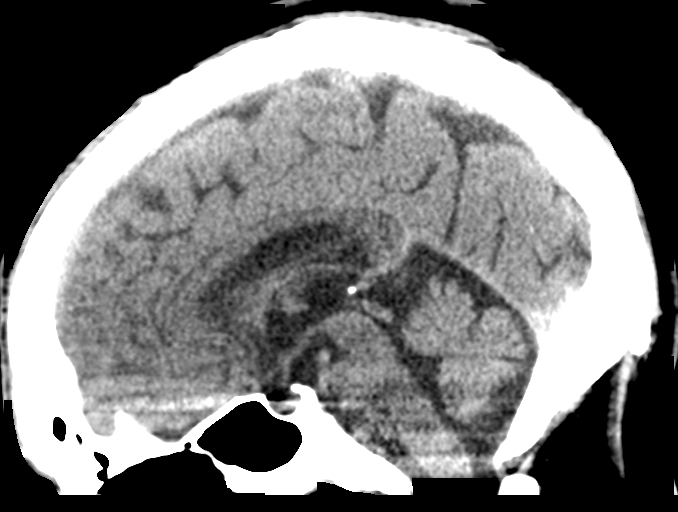
[im 35/53  brain]
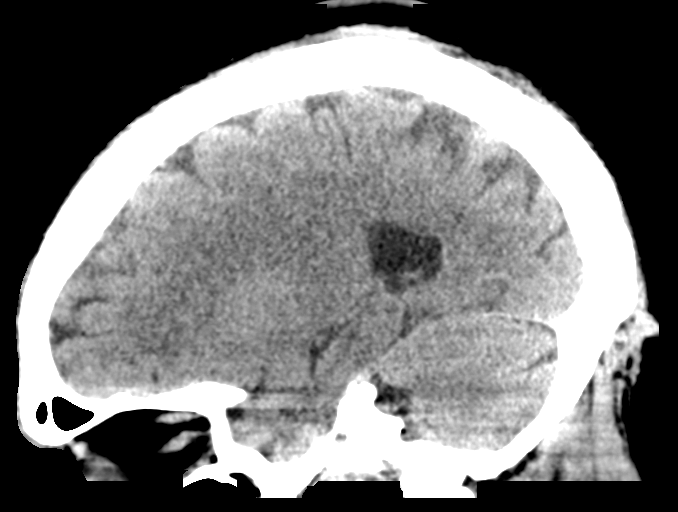

[15 of 47 positions shown; findings below may reference images not displayed]

FINDINGS: Brain: The ventricles and sulci appropriate size for patient's age.
Minimal periventricular and deep white matter chronic microvascular
ischemic changes. There is no acute intracranial hemorrhage. No mass
effect or midline shift. No extra-axial fluid collection.

Vascular: No hyperdense vessel or unexpected calcification.

Skull: Normal. Negative for fracture or focal lesion.

Sinuses/Orbits: No acute finding.

Other: None
IMPRESSION: No acute intracranial pathology.

## 2019-07-13 ENCOUNTER — Other Ambulatory Visit: Payer: Self-pay

## 2019-07-13 ENCOUNTER — Emergency Department
Admission: EM | Admit: 2019-07-13 | Discharge: 2019-09-20 | Disposition: A | Discharge status: Home / Self Care | Payer: Medicare Other | Attending: Emergency Medicine | Admitting: Emergency Medicine

## 2019-07-13 DIAGNOSIS — F209 Schizophrenia, unspecified: Secondary | ICD-10-CM | POA: Insufficient documentation

## 2019-07-13 DIAGNOSIS — F17200 Nicotine dependence, unspecified, uncomplicated: Secondary | ICD-10-CM | POA: Diagnosis present

## 2019-07-13 DIAGNOSIS — Z008 Encounter for other general examination: Secondary | ICD-10-CM

## 2019-07-13 DIAGNOSIS — F17210 Nicotine dependence, cigarettes, uncomplicated: Secondary | ICD-10-CM | POA: Insufficient documentation

## 2019-07-13 DIAGNOSIS — Z9119 Patient's noncompliance with other medical treatment and regimen: Secondary | ICD-10-CM

## 2019-07-13 DIAGNOSIS — Z9114 Patient's other noncompliance with medication regimen: Secondary | ICD-10-CM | POA: Insufficient documentation

## 2019-07-13 DIAGNOSIS — M6281 Muscle weakness (generalized): Secondary | ICD-10-CM | POA: Diagnosis not present

## 2019-07-13 DIAGNOSIS — I10 Essential (primary) hypertension: Secondary | ICD-10-CM | POA: Insufficient documentation

## 2019-07-13 DIAGNOSIS — Z20828 Contact with and (suspected) exposure to other viral communicable diseases: Secondary | ICD-10-CM | POA: Diagnosis not present

## 2019-07-13 DIAGNOSIS — R262 Difficulty in walking, not elsewhere classified: Secondary | ICD-10-CM | POA: Insufficient documentation

## 2019-07-13 DIAGNOSIS — Z91199 Patient's noncompliance with other medical treatment and regimen due to unspecified reason: Secondary | ICD-10-CM

## 2019-07-13 DIAGNOSIS — Z79899 Other long term (current) drug therapy: Secondary | ICD-10-CM | POA: Insufficient documentation

## 2019-07-13 DIAGNOSIS — N390 Urinary tract infection, site not specified: Secondary | ICD-10-CM | POA: Insufficient documentation

## 2019-07-13 DIAGNOSIS — R569 Unspecified convulsions: Secondary | ICD-10-CM

## 2019-07-13 DIAGNOSIS — R4689 Other symptoms and signs involving appearance and behavior: Secondary | ICD-10-CM | POA: Diagnosis present

## 2019-07-13 DIAGNOSIS — R32 Unspecified urinary incontinence: Secondary | ICD-10-CM

## 2019-07-13 LAB — COMPREHENSIVE METABOLIC PANEL
ALT: 13 U/L (ref 0–44)
AST: 29 U/L (ref 15–41)
Albumin: 4.8 g/dL (ref 3.5–5.0)
Alkaline Phosphatase: 80 U/L (ref 38–126)
Anion gap: 10 (ref 5–15)
BUN: 16 mg/dL (ref 6–20)
CO2: 26 mmol/L (ref 22–32)
Calcium: 9.8 mg/dL (ref 8.9–10.3)
Chloride: 103 mmol/L (ref 98–111)
Creatinine, Ser: 1.15 mg/dL (ref 0.61–1.24)
GFR calc Af Amer: >60 mL/min (ref >60)
GFR calc non Af Amer: >60 mL/min (ref >60)
Glucose, Bld: 97 mg/dL (ref 70–99)
Potassium: 4.2 mmol/L (ref 3.5–5.1)
Sodium: 139 mmol/L (ref 135–145)
Total Bilirubin: 1.6 mg/dL — ABNORMAL HIGH (ref 0.3–1.2)
Total Protein: 8.9 g/dL — ABNORMAL HIGH (ref 6.5–8.1)

## 2019-07-13 LAB — CBC
HCT: 46.8 % (ref 39.0–52.0)
Hemoglobin: 15.4 g/dL (ref 13.0–17.0)
MCH: 30.6 pg (ref 26.0–34.0)
MCHC: 32.9 g/dL (ref 30.0–36.0)
MCV: 93 fL (ref 80.0–100.0)
Platelets: 212 10*3/uL (ref 150–400)
RBC: 5.03 MIL/uL (ref 4.22–5.81)
RDW: 11.8 % (ref 11.5–15.5)
WBC: 6 10*3/uL (ref 4.0–10.5)
nRBC: 0 % (ref 0.0–0.2)

## 2019-07-13 LAB — SALICYLATE LEVEL: Salicylate Lvl: <7 mg/dL (ref 2.8–30.0)

## 2019-07-13 LAB — ACETAMINOPHEN LEVEL: Acetaminophen (Tylenol), Serum: <10 ug/mL — ABNORMAL LOW (ref 10–30)

## 2019-07-13 LAB — ETHANOL: Alcohol, Ethyl (B): <10 mg/dL (ref <10)

## 2019-07-13 MED ORDER — HYDROCHLOROTHIAZIDE 12.5 MG PO CAPS
12.5000 mg | ORAL_CAPSULE | Freq: Every day | ORAL | Status: DC
  Administered 2019-07-15 – 2019-09-18 (×42): 12.5 mg via ORAL
  Filled 2019-07-13 (×51): qty 1

## 2019-07-13 MED ORDER — AMLODIPINE BESYLATE 5 MG PO TABS
10.0000 mg | ORAL_TABLET | Freq: Every day | ORAL | Status: DC
  Administered 2019-07-15 – 2019-08-09 (×14): 10 mg via ORAL
  Filled 2019-07-13 (×22): qty 2

## 2019-07-13 MED ORDER — LISINOPRIL 10 MG PO TABS
20.0000 mg | ORAL_TABLET | Freq: Every morning | ORAL | Status: DC
  Administered 2019-07-15 – 2019-07-29 (×9): 20 mg via ORAL
  Filled 2019-07-13 (×15): qty 2

## 2019-07-13 NOTE — ED Notes (Signed)
Pt sleeping.  Provided snack tray in his room for when he wakes.  Will continue 15 min checks

## 2019-07-13 NOTE — ED Notes (Signed)
Pt. Talking to TTS and NP in room. 

## 2019-07-13 NOTE — ED Notes (Signed)
IVC prior to arrival/ No consult ordered at this time  

## 2019-07-13 NOTE — ED Provider Notes (Signed)
[facility] Emergency Department Provider Note  ____________________________________________   First MD Initiated Contact with Patient 07/13/19 1846     (approximate)  I have reviewed the triage vital signs and the nursing notes.   HISTORY  Chief Complaint IVC    HPI Roberto Vasquez is a 58 y.o. male with hepatitis C hypertension, seizures, schizophrenia who presents with IVC.  Patient not been taking his medications.  Has been urinating defecating on the self refusing to cut back.  He denies any SI or HI.  Patient is also been aggressive and paranoid to the [employer].  Patient self denies any concerns.  He is not the best historian therefore the history is limited to him being a poor historian.  He denies any medical complaints.  He is unsure exactly when he is here.  He does endorse some vague auditory hallucinations.          Past Medical History:  Diagnosis Date  . Anxiety   . Hepatitis C   . Hypertension   . Schizophrenia (HCC)   . Seizures [facility])     Patient Active Problem List   Diagnosis Date Noted  . UTI (urinary tract infection) 05/14/2018  . Seizure (HCC) 04/06/2018  . Tobacco use disorder 02/23/2017  . Schizophrenia (HCC) 02/20/2017  . Noncompliance 02/20/2017  . HTN (hypertension) 03/01/2015    Past Surgical History:  Procedure Laterality Date  . ABDOMINAL SURGERY    . VENTRAL HERNIA REPAIR N/A 10/20/2016   Procedure: HERNIA REPAIR VENTRAL ADULT;  Surgeon: Lattie Haw, MD;  Location: [facility] ORS;  Service: General;  Laterality: N/A;    Prior to Admission medications   Medication Sig Start Date End Date Taking? Authorizing Provider  amLODipine (NORVASC) 10 MG tablet Take 10 mg by mouth daily.    [provider]  ARIPiprazole ER (ABILIFY MAINTENA) 300 MG PRSY prefilled syringe Inject 300 mg into the muscle every 28 (twenty-eight) days.    [provider]  ARIPiprazole ER (ABILIFY MAINTENA) 300 MG  PRSY prefilled syringe Inject 300 mg into the muscle.    [provider]  cephALEXin (KEFLEX) 500 MG capsule Take 1 capsule (500 mg total) by mouth 3 (three) times daily. Patient not taking: Reported on 10/26/2018 05/16/18   Milagros Loll, MD  fesoterodine (TOVIAZ) 4 MG TB24 tablet Take 1 tablet (4 mg total) by mouth daily. 08/05/18   Stoioff, Verna Czech, MD  hydrochlorothiazide (MICROZIDE) 12.5 MG capsule Take 12.5 mg by mouth daily.    [provider]  lisinopril (PRINIVIL,ZESTRIL) 20 MG tablet Take 20 mg by mouth every morning.    [provider]  POTASSIUM CHLORIDE PO Take by mouth.    [provider]  QUEtiapine (SEROQUEL) 200 MG tablet Take 1 tablet (200 mg total) by mouth at bedtime. 05/16/18   Milagros Loll, MD  traZODone (DESYREL) 100 MG tablet Take 1 tablet (100 mg total) by mouth at bedtime. 02/26/17   Jimmy Footman, MD    Allergies Patient has no known allergies.  Family History  Problem Relation Age of Onset  . Hypertension Mother     Social History Social History   Tobacco Use  . Smoking status: Current Every Day Smoker    Packs/day: 1.00    Years: 40.00    Pack years: 40.00    Types: Cigarettes  . Smokeless tobacco: Never Used  Substance Use Topics  . Alcohol use: No  . Drug use: No  Review of Systems Constitutional: No fever/chills Eyes: No visual changes. ENT: No sore throat. Cardiovascular: Denies chest pain. Respiratory: Denies shortness of breath. Gastrointestinal: No abdominal pain.  No nausea, no vomiting.  No diarrhea.  No constipation. Genitourinary: Negative for dysuria. Musculoskeletal: Negative for back pain. Skin: Negative for rash. Neurological: Negative for headaches, focal weakness or numbness. Psych: Hallucinations All other ROS negative ____________________________________________   PHYSICAL EXAM:  VITAL SIGNS: ED Triage Vitals [07/13/19 1748]  Enc Vitals Group     BP (!) 144/109      Pulse Rate 79     Resp 18     Temp 98.6 F (37 C)     Temp src      SpO2 100 %     Weight 160 lb (72.6 kg)     Height 5\' 11"  (1.803 m)     Head Circumference      Peak Flow      Pain Score 0     Pain Loc      Pain Edu?      Excl. in [place]?     Constitutional: Alert and oriented. Well appearing and in no acute distress.  Disheveled  eyes: Conjunctivae are normal. EOMI. Head: Atraumatic. Nose: No congestion/rhinnorhea. Mouth/Throat: Mucous membranes are moist.   Neck: No stridor. Trachea Midline. FROM Cardiovascular: Normal rate, regular rhythm. Grossly normal heart sounds.  Good peripheral circulation. Respiratory: Normal respiratory effort.  No retractions. Lungs CTAB. Gastrointestinal: Soft and nontender. No distention. No abdominal bruits.  Musculoskeletal: No lower extremity tenderness nor edema.  No joint effusions. Neurologic:  Normal speech and language. No gross focal neurologic deficits are appreciated.  Skin:  Skin is warm, dry and intact. No rash noted. Psychiatric: Mood and affect are normal. Speech and behavior are normal.  Positive hallucinations GU: Deferred   ____________________________________________   LABS (all labs ordered are listed, but only abnormal results are displayed)  Labs Reviewed  COMPREHENSIVE METABOLIC PANEL - Abnormal; Notable for the following components:      Result Value   Total Protein 8.9 (*)    Total Bilirubin 1.6 (*)    All other components within normal limits  ETHANOL  CBC  SALICYLATE LEVEL  ACETAMINOPHEN LEVEL  URINE DRUG SCREEN, QUALITATIVE ([place])   ____________________________________________   INITIAL IMPRESSION / ASSESSMENT AND PLAN / ED COURSE  Roberto Vasquez was evaluated in Emergency Department on 07/13/2019 for the symptoms described in the history of present illness. He was evaluated in the context of the global COVID-19 pandemic, which necessitated consideration that the patient might be at risk for  infection with the SARS-CoV-2 virus that causes COVID-19. Institutional protocols and algorithms that pertain to the evaluation of patients at risk for COVID-19 are in a state of rapid change based on information released by regulatory bodies including the CDC and federal and state organizations. These policies and algorithms were followed during the patient's care in the ED.     Pt is without any acute medical complaints. No exam findings to suggest medical cause of current presentation. Will order psychiatric screening labs and discuss further w/ psychiatric service.  D/d includes but is not limited to psychiatric disease, behavioral/personality disorder, inadequate socioeconomic support, medical.  Based on HPI, exam, unremarkable labs, no concern for acute medical problem at this time. No rigidity, clonus, hyperthermia, focal neurologic deficit, diaphoresis, tachycardia, meningismus, ataxia, gait abnormality or other finding to suggest this visit represents a non-psychiatric problem. Screening labs reviewed.    Given this,  pt medically cleared, to be dispositioned per Psych.    ____________________________________________   FINAL CLINICAL IMPRESSION(S) / ED DIAGNOSES   Final diagnoses:  Evaluation by psychiatric service required      MEDICATIONS GIVEN DURING THIS VISIT:  Medications - No data to display   ED Discharge Orders    None       Note:  This document was prepared using Dragon voice recognition software and may include unintentional dictation errors.   Concha SeFunke, Jasmain Ahlberg E, MD 07/13/19 [phone]

## 2019-07-13 NOTE — ED Notes (Addendum)
Pt dressed out into burgundy shirt and was able to keep on gray sweat pants. Pt's belongings to include: 1 black watch 1 black security band 1 white t-shirt 1 blue jacket 2 gray shoes 2 blue socks

## 2019-07-13 NOTE — ED Notes (Signed)
Pt. Sitting on bed calm and cooperative.  Pt. Has no questions or concerns at this time.  Pt. Aware he would be spending the night tonight.  Pt. Has talked to TTS and NP Kennyth Lose this evening.

## 2019-07-13 NOTE — ED Triage Notes (Addendum)
Pt comes via [employer] with IVC paperwork in hand. Per paperwork pt has not been taking his medications, he has been urinating and deficating on himself and refusing to take a bath. Pt denies any SI or HI.  Paperwork also states pt has been aggressive and paranoid to the Teaching laboratory technician.  Pt is calm and cooperative.  Pt resides at [facility] in [place] called [facility].

## 2019-07-13 NOTE — BH Assessment (Addendum)
Assessment Note  Roberto Vasquez is an 58 y.o. male who presents to ED under IVC. Per IVC paperwork, patient not been taking his medications. Has been urinating defecating on the self refusing to cut back. Patient is also been aggressive and paranoid to the Quarry manager. Patient denied SI/HI; however, he reports vague auditory hallucinations. Patient was alert and somewhat oriented to person, place, time, and situation. During assessment, pt did not appear to be responding to internal stimuli.  Diagnosis: Schizophrenia, by history  Past Medical History:  Past Medical History:  Diagnosis Date  . Anxiety   . Hepatitis C   . Hypertension   . Schizophrenia (HCC)   . Seizures (HCC)     Past Surgical History:  Procedure Laterality Date  . ABDOMINAL SURGERY    . VENTRAL HERNIA REPAIR N/A 10/20/2016   Procedure: HERNIA REPAIR VENTRAL ADULT;  Surgeon: Lattie Haw, MD;  Location: [facility] ORS;  Service: General;  Laterality: N/A;    Family History:  Family History  Problem Relation Age of Onset  . Hypertension Mother     Social History:  reports that he has been smoking cigarettes. He has a 40.00 pack-year smoking history. He has never used smokeless tobacco. He reports that he does not drink alcohol or use drugs.  Additional Social History:  Alcohol / Drug Use Pain Medications: See MAR Prescriptions: See MAR Over the Counter: See MAR History of alcohol / drug use?: No history of alcohol / drug abuse  CIWA: CIWA-Ar BP: (!) 144/109 Pulse Rate: 79 COWS:    Allergies: No Known Allergies  Home Medications: (Not in a hospital admission)   OB/GYN Status:  No LMP for male patient.  General Assessment Data Location of Assessment: [facility] ED TTS Assessment: In system Is this a Tele or Face-to-Face Assessment?: Face-to-Face Is this an Initial Assessment or a Re-assessment for this encounter?: Initial Assessment Patient Accompanied by:: N/A Language Other than English:  No Living Arrangements: In Group Home: (Comment: Name of Group Home) What gender do you identify as?: Male Marital status: Single Living Arrangements: Group Home Can pt return to current living arrangement?: Yes Admission Status: Involuntary Petitioner: ED Attending Is patient capable of signing voluntary admission?: No Referral Source: Self/Family/Friend Insurance type: Medicare  Medical Screening Exam [facility] Walk-in ONLY) Medical Exam completed: Yes  Crisis Care Plan Living Arrangements: Group Home Legal Guardian: Other:(DSS) Name of Psychiatrist: None Reported Name of Therapist: None Reported  Education Status Is patient currently in school?: No Is the patient employed, unemployed or receiving disability?: Unemployed, Receiving disability income  Risk to self with the past 6 months Suicidal Ideation: No Has patient been a risk to self within the past 6 months prior to admission? : No Suicidal Intent: No Has patient had any suicidal intent within the past 6 months prior to admission? : No Is patient at risk for suicide?: No Suicidal Plan?: No Has patient had any suicidal plan within the past 6 months prior to admission? : No Access to Means: No What has been your use of drugs/alcohol within the last 12 months?: None Previous Attempts/Gestures: No How many times?: 0 Other Self Harm Risks: None Triggers for Past Attempts: None known Intentional Self Injurious Behavior: None Family Suicide History: No Recent stressful life event(s): (None) Persecutory voices/beliefs?: No Depression: No Depression Symptoms: (None) Substance abuse history and/or treatment for substance abuse?: No Suicide prevention information given to non-admitted patients: Not applicable  Risk to Others within the past 6 months Homicidal Ideation:  No Does patient have any lifetime risk of violence toward others beyond the six months prior to admission? : No Thoughts of Harm to Others: No Current  Homicidal Intent: No Current Homicidal Plan: No Access to Homicidal Means: No Identified Victim: None History of harm to others?: No Assessment of Violence: None Noted Violent Behavior Description: None Does patient have access to weapons?: No Criminal Charges Pending?: No Does patient have a court date: No Is patient on probation?: No  Psychosis Hallucinations: Auditory Delusions: None noted  Mental Status Report Appearance/Hygiene: In scrubs Eye Contact: Good Motor Activity: Freedom of movement Speech: Logical/coherent Level of Consciousness: Alert Mood: Pleasant Affect: Appropriate to circumstance Anxiety Level: Minimal Thought Processes: Coherent, Relevant Judgement: Unimpaired Orientation: Person, Place, Time, Situation Obsessive Compulsive Thoughts/Behaviors: None  Cognitive Functioning Concentration: Normal Memory: Remote Intact, Recent Intact Is patient IDD: No Insight: Poor Impulse Control: Poor Appetite: Good Have you had any weight changes? : No Change Sleep: No Change Total Hours of Sleep: 8 Vegetative Symptoms: None  ADLScreening [facility] Assessment Services) Patient's cognitive ability adequate to safely complete daily activities?: Yes Patient able to express need for assistance with ADLs?: Yes Independently performs ADLs?: Yes (appropriate for developmental age)  Prior Inpatient Therapy Prior Inpatient Therapy: No  Prior Outpatient Therapy Prior Outpatient Therapy: No Does patient have an ACCT team?: No Does patient have Intensive In-House Services?  : No Does patient have [facility] services? : No Does patient have P4CC services?: No  ADL Screening (condition at time of admission) Patient's cognitive ability adequate to safely complete daily activities?: Yes Patient able to express need for assistance with ADLs?: Yes Independently performs ADLs?: Yes (appropriate for developmental age)       Abuse/Neglect Assessment (Assessment to be complete  while patient is alone) Abuse/Neglect Assessment Can Be Completed: Yes Physical Abuse: Denies Verbal Abuse: Denies Sexual Abuse: Denies Exploitation of patient/patient's resources: Denies Self-Neglect: Denies Values / Beliefs Cultural Requests During Hospitalization: None Spiritual Requests During Hospitalization: None Consults Spiritual Care Consult Needed: No Social Work Consult Needed: No Regulatory affairs officer (For Healthcare) Does Patient Have a Medical Advance Directive?: No       Child/Adolescent Assessment Running Away Risk: (Patient is an adult)  Disposition:  Disposition Initial Assessment Completed for this Encounter: Yes Disposition of Patient: Discharge  On Site Evaluation by:   Reviewed with Physician:    Frederich Cha 07/13/2019 11:50 PM

## 2019-07-13 NOTE — ED Notes (Signed)
Pt states he does not have to urinate at this time; advised patient that we need a specimen when he does.

## 2019-07-14 DIAGNOSIS — F209 Schizophrenia, unspecified: Secondary | ICD-10-CM | POA: Diagnosis not present

## 2019-07-14 LAB — URINALYSIS, COMPLETE (UACMP) WITH MICROSCOPIC
Bilirubin Urine: NEGATIVE
Glucose, UA: NEGATIVE mg/dL
Ketones, ur: NEGATIVE mg/dL
Nitrite: POSITIVE — AB
Protein, ur: NEGATIVE mg/dL
Specific Gravity, Urine: 1.015 (ref 1.005–1.030)
pH: 5 (ref 5.0–8.0)

## 2019-07-14 LAB — URINE DRUG SCREEN, QUALITATIVE (ARMC ONLY)
Amphetamines, Ur Screen: NOT DETECTED
Barbiturates, Ur Screen: NOT DETECTED
Benzodiazepine, Ur Scrn: NOT DETECTED
Cannabinoid 50 Ng, Ur ~~LOC~~: NOT DETECTED
Cocaine Metabolite,Ur ~~LOC~~: NOT DETECTED
MDMA (Ecstasy)Ur Screen: NOT DETECTED
Methadone Scn, Ur: NOT DETECTED
Opiate, Ur Screen: NOT DETECTED
Phencyclidine (PCP) Ur S: NOT DETECTED
Tricyclic, Ur Screen: NOT DETECTED

## 2019-07-14 MED ORDER — QUETIAPINE FUMARATE 200 MG PO TABS
200.0000 mg | ORAL_TABLET | Freq: Every day | ORAL | Status: DC
  Administered 2019-07-15 – 2019-09-18 (×57): 200 mg via ORAL
  Filled 2019-07-14 (×61): qty 1

## 2019-07-14 MED ORDER — CEPHALEXIN 500 MG PO CAPS
500.0000 mg | ORAL_CAPSULE | Freq: Three times a day (TID) | ORAL | Status: AC
  Administered 2019-07-15 – 2019-07-23 (×25): 500 mg via ORAL
  Filled 2019-07-14 (×25): qty 1

## 2019-07-14 MED ORDER — CEPHALEXIN 500 MG PO CAPS
500.0000 mg | ORAL_CAPSULE | Freq: Once | ORAL | Status: AC
  Administered 2019-07-14: 500 mg via ORAL
  Filled 2019-07-14: qty 1

## 2019-07-14 MED ORDER — TRAZODONE HCL 100 MG PO TABS
100.0000 mg | ORAL_TABLET | Freq: Every day | ORAL | Status: DC
  Administered 2019-07-15 – 2019-09-18 (×56): 100 mg via ORAL
  Filled 2019-07-14 (×61): qty 1

## 2019-07-14 NOTE — ED Notes (Signed)
Pt refused vitals 

## 2019-07-14 NOTE — ED Provider Notes (Signed)
-----------------------------------------   8:10 AM on 07/14/2019 -----------------------------------------   Blood pressure (!) 157/106, pulse 95, temperature (!) 97.1 F (36.2 C), temperature source Oral, resp. rate 17, height 5\' 11"  (1.803 m), weight 72.6 kg, SpO2 100 %.  The patient is calm and cooperative at this time.  There have been no acute events since the last update.  Awaiting disposition plan from Behavioral Medicine and/or Social Work team(s).    Merlyn Lot, MD 07/14/19 [phone]

## 2019-07-14 NOTE — Consult Note (Signed)
  Upon evaluation this morning patient remained somewhat bizarre.  Writer requested urinalysis and found patient to have UTI.  Will hold patient overnight treated with antibiotics and likely DC tomorrow morning.

## 2019-07-14 NOTE — ED Notes (Signed)
IVC'D/PENDING PSYCH ADMIT

## 2019-07-14 NOTE — ED Notes (Signed)
Called pharmacy to reschedule Keflex for 8 hours from previous dose

## 2019-07-14 NOTE — ED Notes (Signed)
Meal tray placed in patient room. 

## 2019-07-14 NOTE — Consult Note (Signed)
[facility] Face-to-Face Psychiatry Consult   Reason for Consult:  IVC Referring Physician:  Dr.Funke Patient Identification: Roberto Vasquez MRN:  [id number] Principal Diagnosis: Noncompliance Diagnosis:  Principal Problem:   Noncompliance Active Problems:   HTN (hypertension)   Schizophrenia ([place])   Tobacco use disorder   Seizure ([place])   UTI (urinary tract infection)   Total Time spent with patient: 45 minutes  Subjective: "I do not know why I am here." Roberto Vasquez is a 58 y.o. male patient presented to [facility] ED via law enforcement under involuntary commitment status (IVC).   The patient was brought into the ED due to him not being medication compliant.  He has been urinating and defecating on himself by refusing to take a bath after the act is done.  The report also stated the patient being aggressive when asked to take a bath.The IVC paperwork said the patient has been aggressive towards the staff at his group home and even paranoid. The patient was seen face-to-face by this provider; chart reviewed and consulted with Dr. Jari Pigg on 07/13/2019 due to the patient's care. It was discussed with the EDP that the patient does not meet the criteria to be admitted to the inpatient unit.  On evaluation, the patient is alert and oriented x 2-3; he is calm, cooperative and mood-congruent with affect. The patient does not appear to be responding to internal or external stimuli. Neither is the patient presenting with any delusional thinking. The patient does admit auditory and visual hallucinations. The patient denies any suicidal, homicidal, or self-harm ideations. The patient is not presenting with any psychotic or paranoid behaviors. During an encounter with the patient, he was able to answer questions appropriately. Plan: The patient is not a safety risk to self or others and does not require psychiatric inpatient admission for stabilization and treatment.  HPI: Per Dr. Jari Pigg; Roberto Vasquez is a 58 y.o. male with hepatitis C hypertension, seizures, schizophrenia who presents with IVC.  Patient not been taking his medications.  Has been urinating defecating on the self refusing to cut back.  He denies any SI or HI.  Patient is also been aggressive and paranoid to the Teaching laboratory technician.  Patient self denies any concerns.  He is not the best historian therefore the history is limited to him being a poor historian.  He denies any medical complaints.  He is unsure exactly when he is here.  He does endorse some vague auditory hallucinations  Past Psychiatric History:  Anxiety  Schizophrenia ([place]) Seizures (HCC) Risk to Self: Suicidal Ideation: No Suicidal Intent: No Is patient at risk for suicide?: No Suicidal Plan?: No Access to Means: No What has been your use of drugs/alcohol within the last 12 months?: None How many times?: 0 Other Self Harm Risks: None Triggers for Past Attempts: None known Intentional Self Injurious Behavior: None Risk to Others: Homicidal Ideation: No Thoughts of Harm to Others: No Current Homicidal Intent: No Current Homicidal Plan: No Access to Homicidal Means: No Identified Victim: None History of harm to others?: No Assessment of Violence: None Noted Violent Behavior Description: None Does patient have access to weapons?: No Criminal Charges Pending?: No Does patient have a court date: No Prior Inpatient Therapy: Prior Inpatient Therapy: No Prior Outpatient Therapy: Prior Outpatient Therapy: No Does patient have an ACCT team?: No Does patient have Intensive In-House Services?  : No Does patient have [facility] services? : No Does patient have P4CC services?: No  Past Medical History:  Past Medical History:  Diagnosis Date  . Anxiety   . Hepatitis C   . Hypertension   . Schizophrenia (HCC)   . Seizures (HCC)     Past Surgical History:  Procedure Laterality Date  . ABDOMINAL SURGERY    . VENTRAL HERNIA REPAIR N/A 10/20/2016    Procedure: HERNIA REPAIR VENTRAL ADULT;  Surgeon: Lattie Haw, MD;  Location: [facility] ORS;  Service: General;  Laterality: N/A;   Family History:  Family History  Problem Relation Age of Onset  . Hypertension Mother    Family Psychiatric  History: No pertinent family psychiatric history given Social History:  Social History   Substance and Sexual Activity  Alcohol Use No     Social History   Substance and Sexual Activity  Drug Use No    Social History   Socioeconomic History  . Marital status: Single    Spouse name: Not on file  . Number of children: Not on file  . Years of education: Not on file  . Highest education level: Not on file  Occupational History  . Not on file  Social Needs  . Financial resource strain: Not on file  . Food insecurity    Worry: Not on file    Inability: Not on file  . Transportation needs    Medical: Not on file    Non-medical: Not on file  Tobacco Use  . Smoking status: Current Every Day Smoker    Packs/day: 1.00    Years: 40.00    Pack years: 40.00    Types: Cigarettes  . Smokeless tobacco: Never Used  Substance and Sexual Activity  . Alcohol use: No  . Drug use: No  . Sexual activity: Yes  Lifestyle  . Physical activity    Days per week: Not on file    Minutes per session: Not on file  . Stress: Not on file  Relationships  . Social Musician on phone: Not on file    Gets together: Not on file    Attends religious service: Not on file    Active member of club or organization: Not on file    Attends meetings of clubs or organizations: Not on file    Relationship status: Not on file  Other Topics Concern  . Not on file  Social History Narrative   From a group home   Currently from a jail.   Ambulates independently at baseline   Additional Social History:    Allergies:  No Known Allergies  Labs:  Results for orders placed or performed during the hospital encounter of 07/13/19 (from the past 48 hour(s))   Comprehensive metabolic panel     Status: Abnormal   Collection Time: 07/13/19  5:53 PM  Result Value Ref Range   Sodium 139 135 - 145 mmol/L   Potassium 4.2 3.5 - 5.1 mmol/L   Chloride 103 98 - 111 mmol/L   CO2 26 22 - 32 mmol/L   Glucose, Bld 97 70 - 99 mg/dL   BUN 16 6 - 20 mg/dL   Creatinine, Ser 3.01 0.61 - 1.24 mg/dL   Calcium 9.8 8.9 - 60.1 mg/dL   Total Protein 8.9 (H) 6.5 - 8.1 g/dL   Albumin 4.8 3.5 - 5.0 g/dL   AST 29 15 - 41 U/L   ALT 13 0 - 44 U/L   Alkaline Phosphatase 80 38 - 126 U/L   Total Bilirubin 1.6 (H) 0.3 - 1.2 mg/dL   GFR  calc non Af Amer >60 >60 mL/min   GFR calc Af Amer >60 >60 mL/min   Anion gap 10 5 - 15    Comment: Performed at [facility], [address] [place], [place] [address]  Ethanol     Status: None   Collection Time: 07/13/19  5:53 PM  Result Value Ref Range   Alcohol, Ethyl (B) <10 <10 mg/dL    Comment: (NOTE) Lowest detectable limit for serum alcohol is 10 mg/dL. For medical purposes only. Performed at [facility], [address] [place], [place] [address]   Salicylate level     Status: None   Collection Time: 07/13/19  5:53 PM  Result Value Ref Range   Salicylate Lvl <7.0 2.8 - 30.0 mg/dL    Comment: Performed at [facility], [address] [place], [place] [address]  Acetaminophen level     Status: Abnormal   Collection Time: 07/13/19  5:53 PM  Result Value Ref Range   Acetaminophen (Tylenol), Serum <10 (L) 10 - 30 ug/mL    Comment: (NOTE) Therapeutic concentrations vary significantly. A range of 10-30 ug/mL  may be an effective concentration for many patients. However, some  are best treated at concentrations outside of this range. Acetaminophen concentrations >150 ug/mL at 4 hours after ingestion  and >50 ug/mL at 12 hours after ingestion are often associated with  toxic reactions. Performed at [facility], [address] [place], [place] [address]   cbc     Status: None    Collection Time: 07/13/19  5:53 PM  Result Value Ref Range   WBC 6.0 4.0 - 10.5 K/uL   RBC 5.03 4.22 - 5.81 MIL/uL   Hemoglobin 15.4 13.0 - 17.0 g/dL   HCT 29.546.8 62.139.0 - 30.852.0 %   MCV 93.0 80.0 - 100.0 fL   MCH 30.6 26.0 - 34.0 pg   MCHC 32.9 30.0 - 36.0 g/dL   RDW 65.711.8 84.611.5 - 96.215.5 %   Platelets 212 150 - 400 K/uL   nRBC 0.0 0.0 - 0.2 %    Comment: Performed at [facility], [address] [place], [place] [address]    Current Facility-Administered Medications  Medication Dose Route Frequency Provider Last Rate Last Dose  . amLODipine (NORVASC) tablet 10 mg  10 mg Oral Daily Concha SeFunke, Mary E, MD      . hydrochlorothiazide (MICROZIDE) capsule 12.5 mg  12.5 mg Oral Daily Concha SeFunke, Mary E, MD      . lisinopril (ZESTRIL) tablet 20 mg  20 mg Oral q morning - 10a Concha SeFunke, Mary E, MD       Current Outpatient Medications  Medication Sig Dispense Refill  . amLODipine (NORVASC) 10 MG tablet Take 10 mg by mouth daily.    . ARIPiprazole ER (ABILIFY MAINTENA) 300 MG PRSY prefilled syringe Inject 300 mg into the muscle every 28 (twenty-eight) days.    . ARIPiprazole ER (ABILIFY MAINTENA) 300 MG PRSY prefilled syringe Inject 300 mg into the muscle.    . cephALEXin (KEFLEX) 500 MG capsule Take 1 capsule (500 mg total) by mouth 3 (three) times daily. (Patient not taking: Reported on 10/26/2018) 12 capsule 0  . fesoterodine (TOVIAZ) 4 MG TB24 tablet Take 1 tablet (4 mg total) by mouth daily. 90 tablet 3  . hydrochlorothiazide (MICROZIDE) 12.5 MG capsule Take 12.5 mg by mouth daily.    Marland Kitchen. lisinopril (PRINIVIL,ZESTRIL) 20 MG tablet Take 20 mg by mouth every morning.    Marland Kitchen. POTASSIUM CHLORIDE  PO Take by mouth.    . QUEtiapine (SEROQUEL) 200 MG tablet Take 1 tablet (200 mg total) by mouth at bedtime. 15 tablet 0  . traZODone (DESYREL) 100 MG tablet Take 1 tablet (100 mg total) by mouth at bedtime. 30 tablet 0    Musculoskeletal: Strength & Muscle Tone: within normal limits Gait & Station: normal Patient  leans: N/A  Psychiatric Specialty Exam: Physical Exam  Nursing note and vitals reviewed. Constitutional: He is oriented to person, place, and time. He appears well-developed.  Neck: Normal range of motion. Neck supple.  Respiratory: Effort normal.  Musculoskeletal: Normal range of motion.  Neurological: He is alert and oriented to person, place, and time.  Psychiatric: His behavior is normal.    Review of Systems  Psychiatric/Behavioral: Positive for depression and hallucinations. The patient has insomnia.   All other systems reviewed and are negative.   Blood pressure (!) 144/109, pulse 79, temperature 98.6 F (37 C), resp. rate 18, height  (1.803 m), weight 72.6 kg, SpO2 100 %.Body mass index is 22.32 kg/m.  General Appearance: Casual  Eye Contact:  Fair  Speech:  Slow  Volume:  Decreased  Mood:  NA  Affect:  Appropriate and Non-Congruent  Thought Process:  Coherent  Orientation:  Full (Time, Place, and Person)  Thought Content:  WDL and Logical  Suicidal Thoughts:  No  Homicidal Thoughts:  No  Memory:  Immediate;   Fair Recent;   Fair Remote;   Fair  Judgement:  Fair  Insight:  Lacking  Psychomotor Activity:  Normal  Concentration:  Concentration: Fair and Attention Span: Fair  Recall:  [employer] of Knowledge:  Fair  Language:  Good  Akathisia:  NA  Handed:  Right  AIMS (if indicated):     Assets:  Desire for Improvement Financial Resources/Insurance Social Support Transportation Vocational/Educational  ADL's:  Intact  Cognition:  WNL  Sleep:        Treatment Plan Summary: Daily contact with patient to assess and evaluate symptoms and progress in treatment, Medication management and Plan Patient does not meet criteria for psychiatric inpatient admission.  Disposition: No evidence of imminent risk to self or others at present.   Patient does not meet criteria for psychiatric inpatient admission. Supportive therapy provided about ongoing  stressors.  Gillermo Murdoch, NP 07/14/2019 1:42 AM

## 2019-07-14 NOTE — ED Notes (Signed)
This RN spoke with patient about taking prescribed medication. Patient refusing night time medications. Patient instructed that his BP was elevated and the need to take BP medications. Patient reported he would take next prescribed dose in the a.m.

## 2019-07-15 DIAGNOSIS — F209 Schizophrenia, unspecified: Secondary | ICD-10-CM | POA: Diagnosis not present

## 2019-07-15 NOTE — BH Assessment (Signed)
Writer received phone call from patient's guardian with [facility] (Rhesa Robina Ade). Writer informed the patient was ready for discharge. She reported the patient was unable to return to his [facility] and they were in the process of getting him place at another facility. They have two facilities, in [place] reviewing his information; [employer]. She also informed Probation officer she was about to be unavailable and provided the contact information for her supervisor and coworker.  Writer spoke with ER Psychiatrist (Dr. Claris Gower) and he instructed writer to see if he could return to the [facility] till he is place and or he stay somewhere else.  Writer called and left HIPP compliant message, requesting a return phone call. Messages left with Elaina Hoops [phone]) and Sherian Maroon [phone]).

## 2019-07-15 NOTE — ED Notes (Signed)
Checked pt's brief. Clean and dry.

## 2019-07-15 NOTE — ED Notes (Addendum)
Patent IVC rescinded. Guardianship documents received and on chart.

## 2019-07-15 NOTE — ED Notes (Signed)
Upon assessment. Pts bed sheet is wet with urine and cloths damp at this time. Staff to bedside to clean patient and change bedding.

## 2019-07-15 NOTE — ED Notes (Signed)
Patient refused breakfast and lunch after several attempts. Dinner patient ate 40% of meal and drank a cup of ginger ale.

## 2019-07-15 NOTE — ED Notes (Signed)
Patient

## 2019-07-15 NOTE — ED Notes (Signed)
Patient laying bed with no activity, declined TV, declined the lights be put on for him. Declined something to eat or drink.

## 2019-07-15 NOTE — ED Notes (Signed)
Patient donner with no difficulty, completes 60%. Presently laying in bed with eyes closed, arouses easily to name. Safety maintained.

## 2019-07-15 NOTE — ED Notes (Addendum)
Assumed care of patient. Safety maintained. Will monitor

## 2019-07-15 NOTE — ED Notes (Signed)
Patient awakened and offered something to eat or drink. Patient declined, as per tech patient has been dumping his food or just not eating or drinking at all.

## 2019-07-15 NOTE — ED Provider Notes (Signed)
-----------------------------------------   6:57 AM on 07/15/2019 -----------------------------------------   Blood pressure (!) 148/93, pulse 83, temperature 97.7 F (36.5 C), temperature source Oral, resp. rate 17, height 5\' 11"  (1.803 m), weight 72.6 kg, SpO2 99 %.  The patient is sleeping at this time.  There have been no acute events since the last update.  Awaiting disposition plan from Behavioral Medicine and/or Social Work team(s).   Paulette Blanch, MD 07/15/19 [phone]

## 2019-07-15 NOTE — ED Notes (Signed)
Report received from [place], [place]. Pt resting in bed in NAD at this time.

## 2019-07-15 NOTE — ED Notes (Signed)
Scheduled meds given. Patient swallowed without difficulties.

## 2019-07-16 DIAGNOSIS — F209 Schizophrenia, unspecified: Secondary | ICD-10-CM | POA: Diagnosis not present

## 2019-07-16 LAB — SARS CORONAVIRUS 2 BY RT PCR (HOSPITAL ORDER, PERFORMED IN ~~LOC~~ HOSPITAL LAB): SARS Coronavirus 2: NEGATIVE

## 2019-07-16 NOTE — ED Provider Notes (Signed)
-----------------------------------------   6:02 AM on 07/16/2019 -----------------------------------------   Blood pressure (!) 158/102, pulse 82, temperature 98.3 F (36.8 C), temperature source Oral, resp. rate 16, height 5\' 11"  (1.803 m), weight 72.6 kg, SpO2 99 %.  The patient is sleeping at this time.  There have been no acute events since the last update.  Awaiting disposition plan from Behavioral Medicine and/or Social Work team(s).   Paulette Blanch, MD 07/16/19 [phone]

## 2019-07-16 NOTE — ED Notes (Signed)
Pt has covers over head and is refusing antibiotic. This RN attempted many times to explain that pt needs this for urinary infection. Pt remains adamant that he will not take this medication.

## 2019-07-16 NOTE — ED Notes (Signed)
Vet, NT cleaned pt of urine and applied new brief. Pt refused shower. Pt wiped down with warm wipes. Pt back to resting in bed without acute distress.

## 2019-07-16 NOTE — ED Notes (Signed)
Pt refused breakfast tray.  

## 2019-07-17 DIAGNOSIS — F209 Schizophrenia, unspecified: Secondary | ICD-10-CM | POA: Diagnosis not present

## 2019-07-17 NOTE — ED Notes (Signed)
Pt much more alert, oriented, and independent today. Pt walking to bathroom instead of using brief. Pt has showered independently and brushed his teeth. Pt wearing underwear instead of brief saying that he will let us know when he has to use the bathroom. Pt up and watching tv. Pt changing channels with remote. Pt talking with officers about football and wrestling. Pt calm, cooperative, and pleasant.

## 2019-07-17 NOTE — ED Notes (Signed)
Pt willing to shower today. Pt showering independently.

## 2019-07-17 NOTE — ED Notes (Signed)
Pt checked for soiled brief and bed linen, pt remains dry.  

## 2019-07-17 NOTE — ED Notes (Signed)
Pt given meal tray.

## 2019-07-17 NOTE — ED Notes (Signed)
Pt checked for soiled brief and bed linen, pt remains dry.

## 2019-07-17 NOTE — ED Notes (Signed)
Pt vitals obtained, linen and under garments checked for urine. Pt remains clean & dry and verbalizes understanding to let us know if he needs help getting up to use restroom

## 2019-07-17 NOTE — ED Provider Notes (Signed)
-----------------------------------------   6:48 AM on 07/17/2019 -----------------------------------------   Blood pressure 99/70, pulse 75, temperature 97.9 F (36.6 C), temperature source Oral, resp. rate 15, height 5\' 11"  (1.803 m), weight 72.6 kg, SpO2 98 %.  The patient is calm and cooperative at this time.  There have been no acute events since the last update.  Awaiting disposition plan from Behavioral Medicine and/or Social Work team(s).    Harvest Dark, MD 07/17/19 [phone]

## 2019-07-17 NOTE — ED Notes (Signed)
Meal tray at bedside.  

## 2019-07-17 NOTE — ED Notes (Signed)
Pt awake asking for something to eat, this writer explained to pt that it was almost 2am and that breakfast would be here around 8am. Pt offers crackers and declines.   Pt checked for soiled brief or linen and remained dry, this writer will check again before shift change.

## 2019-07-17 NOTE — ED Notes (Signed)
Pt got up to restroom independently. Pt ate about 50% of lunch tray.

## 2019-07-17 NOTE — ED Notes (Signed)
This RN and [place], RN changed pt of stool and urine. Pt given new brief and pad under bottom. Pt cleaned with warm wipes.

## 2019-07-17 NOTE — Social Work (Signed)
SW received call from attending nurse who reported that the patient's symptoms have return to baseline. Patient interacting positively with staff, pleasant and cooperative.   Plan: SW will call group home/guardian.  Berenice Bouton, MSW, LCSW  [phone] 8am-6pm (weekends) or CSW ED # [phone]

## 2019-07-18 DIAGNOSIS — F209 Schizophrenia, unspecified: Secondary | ICD-10-CM | POA: Diagnosis not present

## 2019-07-18 NOTE — ED Provider Notes (Signed)
-----------------------------------------   4:54 AM on 07/18/2019 -----------------------------------------   Blood pressure 124/79, pulse 89, temperature 98.1 F (36.7 C), temperature source Oral, resp. rate 18, height 1.803 m (5\' 11" ), weight 72.6 kg, SpO2 99 %.  The patient is calm and cooperative at this time.  There have been no acute events since the last update.  Awaiting disposition plan from Behavioral Medicine and/or Social Work team(s).   Hinda Kehr, MD 07/18/19 [phone]

## 2019-07-18 NOTE — Social Work (Addendum)
CSW attempted to contact [facility] to speak with patient's guardian Rhesa Robina Ade. CSW unable to reach receptionist at [facility]. Will attempt again later.   3:32pm - CSW left voicemail for [employer] owner, Adolphus Birchwood, to see if patient can return until new placement is found, and if she had a direct phone number for patient's guardian, Lonia Chimera.   3:40pm - Joaquim Lai returned phone call and shared that patient cannot return until new placement is found. She stated that she is afraid patient will run out in to the street, and get hit by a car. She reported that his guardian is attempting to find a assisted living facility for him to go to. Joaquim Lai did not have patient's guardian's phone number with her, and shared that her name is Algie Coffer.     [place], [place] ED  [phone]

## 2019-07-18 NOTE — ED Notes (Signed)
BEHAVIORAL HEALTH ROUNDING Patient sleeping: Yes.   Patient alert and oriented: not applicable Behavior appropriate: Yes.  ; If no, describe:  Nutrition and fluids offered: No Toileting and hygiene offered: No Sitter present: not applicable Law enforcement present: Yes  

## 2019-07-18 NOTE — ED Notes (Signed)

## 2019-07-18 NOTE — ED Notes (Signed)
BEHAVIORAL HEALTH ROUNDING Patient sleeping: No. Patient alert and oriented: yes Behavior appropriate: Yes.  ; If no, describe:  Nutrition and fluids offered: Yes  Toileting and hygiene offered: Yes  Sitter present: not applicable Law enforcement present: Yes  

## 2019-07-18 NOTE — ED Notes (Signed)
Pt asleep breakfast tray placed in rm.

## 2019-07-18 NOTE — ED Notes (Signed)
VOL  PENDING  PLACEMENT 

## 2019-07-18 NOTE — ED Notes (Signed)
Awakened for breakfast and medications. Able to feed self. Denies discomfort. Cooperative.

## 2019-07-18 NOTE — ED Provider Notes (Signed)
-----------------------------------------   4:36 AM on 07/19/2019 -----------------------------------------  Blood pressure 124/79, pulse 89, temperature 98.1 F (36.7 C), temperature source Oral, resp. rate 18, height 5\' 11"  (1.803 m), weight 72.6 kg, SpO2 99 %.  The patient is calm and cooperative at this time.  There have been no acute events since the last update.  Awaiting disposition plan from Behavioral Medicine team.   Blake Divine, MD 07/19/19 [phone]

## 2019-07-18 NOTE — ED Notes (Signed)
Appears to be sleeping, resp unlabored.

## 2019-07-18 NOTE — ED Notes (Signed)
Patient encouraged to drink fluids, and eat a little something. Patient declined stating " I am not hungry". Patient incontinent of bladder,. Patient was able to given himself peri care with instructions to do so. Clean dry clothing provided. Patient dressed himself.

## 2019-07-18 NOTE — ED Notes (Signed)
Gave food tray with juice. Pt states he is not hungry at this time.

## 2019-07-19 DIAGNOSIS — F209 Schizophrenia, unspecified: Secondary | ICD-10-CM | POA: Diagnosis not present

## 2019-07-19 NOTE — ED Notes (Signed)
Patient ate 100% of supper and beverage.  

## 2019-07-19 NOTE — ED Notes (Signed)
Patient ate 100% of lunch and beverage.  

## 2019-07-19 NOTE — ED Notes (Signed)
VOL  PENDING  PLACEMENT 

## 2019-07-19 NOTE — Social Work (Addendum)
CSW continuing to contact [facility], but keep getting the following message "your call cannot be completed at this time." CSW will try again momentarily.    1:41pm - Phone lines still down at [address], cannot get in contact with legal guardian.    [place], [place] ED  [phone]

## 2019-07-19 NOTE — ED Notes (Signed)
Patient is eating lunch, no signs of distress, denies Si/hi or ahv, q 15 minute checks, will continue to monitor.

## 2019-07-19 NOTE — ED Notes (Signed)
Pt in shower at this time. Bed linen changed.  

## 2019-07-19 NOTE — ED Notes (Signed)
Patient ate 100% of breakfast and beverage.  

## 2019-07-19 NOTE — Social Work (Addendum)
Patient's legal guardian Kalman Drape ([phone]) followed-up with CSW. Rhesha shared that she has faxed patient's FL2 to [facility], [facility], and [facility], and is awaiting for a response from them. Rhesha shared that she is trying to get patient into a memory care unit however the patient has not yet officially been diagnosed with dementia so that is a barrier. CSW encouraged Rhesha to look into regular ALFs until diagnosis is givien. Patient has an appointment with a neurologist coming up next month, and can possibly be transferred to a memory care unit later. Rhesha shared that she will look into ALFs for patient tomorrow. Rhesha also shared that PCP at this time cannot provide FL2 with dementia diagnosis until he is seen by the neurologist.  Astrid Divine asked that patient's psychiatric evaluation be faxed to [phone].    [place], [place] ED  [phone]

## 2019-07-19 NOTE — ED Notes (Signed)
Pt given breakfast tray

## 2019-07-19 NOTE — Social Work (Signed)
CSW was transferred to patient's legal guardian, Algie Coffer by the DSS operator (unable to get her direct phone number). Voicemail was left to call CSW back to provide updates regarding placement.    [place], [place] ED  [phone]

## 2019-07-20 DIAGNOSIS — F209 Schizophrenia, unspecified: Secondary | ICD-10-CM | POA: Diagnosis not present

## 2019-07-20 NOTE — ED Notes (Signed)
BEHAVIORAL HEALTH ROUNDING Patient sleeping: No. Patient alert and oriented: yes Behavior appropriate: Yes.  ; If no, describe:  Nutrition and fluids offered: yes Toileting and hygiene offered: Yes  Sitter present: q15 minute observations  ENVIRONMENTAL ASSESSMENT Potentially harmful objects out of patient reach: Yes.   Personal belongings secured: Yes.   Patient dressed in hospital provided attire only: Yes.   Plastic bags out of patient reach: Yes.   Patient care equipment (cords, cables, call bells, lines, and drains) shortened, removed, or accounted for: Yes.   Equipment and supplies removed from bottom of stretcher: Yes.   Potentially toxic materials out of patient reach: Yes.   Sharps container removed or out of patient reach: Yes.   

## 2019-07-20 NOTE — ED Notes (Signed)
BEHAVIORAL HEALTH ROUNDING Patient sleeping: No. Patient alert and oriented: yes Behavior appropriate: Yes.  ; If no, describe:  Nutrition and fluids offered: yes Toileting and hygiene offered: Yes  Sitter present: q15 minute observations  

## 2019-07-20 NOTE — ED Notes (Signed)
ED Is the patient under IVC or is there intent for IVC:  voluntary  Is the patient medically cleared: Yes.   Is there vacancy in the ED BHU: Yes.   Is the population mix appropriate for patient: Yes.   Is the patient awaiting placement in inpatient or outpatient setting: Yes.   Has the patient had a psychiatric consult: Yes.   Survey of unit performed for contraband, proper placement and condition of furniture, tampering with fixtures in bathroom, shower, and each patient room: Yes.  ; Findings:  APPEARANCE/BEHAVIOR Calm and cooperative NEURO ASSESSMENT Orientation: oriented x3  Denies pain Hallucinations: No.None noted (Hallucinations) denies  Speech: Normal Gait: normal RESPIRATORY ASSESSMENT Even  Unlabored respirations  CARDIOVASCULAR ASSESSMENT Pulses equal   regular rate  Skin warm and dry   GASTROINTESTINAL ASSESSMENT no GI complaint EXTREMITIES Full ROM  PLAN OF CARE Provide calm/safe environment. Vital signs assessed twice daily. ED BHU Assessment once each 12-hour shift. Collaborate with TTS daily or as condition indicates. Assure the ED provider has rounded once each shift. Provide and encourage hygiene. Provide redirection as needed. Assess for escalating behavior; address immediately and inform ED provider.  Assess family dynamic and appropriateness for visitation as needed: Yes.  ; If necessary, describe findings:  Educate the patient/family about BHU procedures/visitation: Yes.  ; If necessary, describe findings:

## 2019-07-20 NOTE — ED Provider Notes (Signed)
-----------------------------------------   5:38 AM on 07/20/2019 -----------------------------------------   Blood pressure (!) 138/100, pulse 79, temperature 98.2 F (36.8 C), temperature source Oral, resp. rate 20, height 1.803 m (5\' 11" ), weight 72.6 kg, SpO2 100 %.  The patient is calm and cooperative at this time.  There have been no acute events since the last update.  Awaiting disposition plan from Behavioral Medicine and/or Social Work team(s).   Hinda Kehr, MD 07/20/19 [phone]

## 2019-07-20 NOTE — ED Notes (Signed)
Pt observed lying in bed - watching TV   Pt visualized with NAD  Introduced myself to him  Plan of care discussed   No verbalized needs or concerns at this time  Continue to monitor

## 2019-07-20 NOTE — ED Notes (Signed)
VOL/Pending Placement 

## 2019-07-20 NOTE — ED Notes (Signed)
BEHAVIORAL HEALTH ROUNDING Patient sleeping: No. Patient alert and oriented: yes Behavior appropriate: Yes.  ; If no, describe:  Nutrition and fluids offered: yes Toileting and hygiene offered: Yes  Sitter present: q15 minute observations Law enforcement present: Yes  ACSD  

## 2019-07-21 DIAGNOSIS — F209 Schizophrenia, unspecified: Secondary | ICD-10-CM | POA: Diagnosis not present

## 2019-07-21 NOTE — ED Provider Notes (Signed)
-----------------------------------------   6:18 AM on 07/21/2019 -----------------------------------------   Blood pressure 99/76, pulse 98, temperature 98.2 F (36.8 C), temperature source Oral, resp. rate 20, height 5\' 11"  (1.803 m), weight 72.6 kg, SpO2 100 %.  The patient is calm and cooperative at this time.  There have been no acute events since the last update.  Awaiting disposition plan from Behavioral Medicine and/or Social Work team(s).    Nena Polio, MD 07/21/19 [phone]

## 2019-07-21 NOTE — NC FL2 (Signed)
[place] MEDICAID FL2 LEVEL OF CARE SCREENING TOOL     IDENTIFICATION  Patient Name: Roberto Vasquez Birthdate: 10/13/1960 Sex: male Admission Date (Current Location): 07/13/2019  County and Medicaid Number:  [place] [id number] Facility and Address:  [facility], [address], , [place] [address]      Provider Number: [id number]  Attending Physician Name and Address:  No att. providers found  Relative Name and Phone Number:  DSS Legal Guardian - Rhesha Carr, office : [phone]    Current Level of Care: Hospital Recommended Level of Care: Assisted Living Facility Prior Approval Number:    Date Approved/Denied:   PASRR Number:    Discharge Plan: Other (Comment)(ALF)    Current Diagnoses: Patient Active Problem List   Diagnosis Date Noted  . UTI (urinary tract infection) 05/14/2018  . Seizure (HCC) 04/06/2018  . Tobacco use disorder 02/23/2017  . Schizophrenia (HCC) 02/20/2017  . Noncompliance 02/20/2017  . HTN (hypertension) 03/01/2015    Orientation RESPIRATION BLADDER Height & Weight     Self, Time, Place  Normal Continent Weight: 160 lb (72.6 kg) Height:  5' 11" (180.3 cm)  BEHAVIORAL SYMPTOMS/MOOD NEUROLOGICAL BOWEL NUTRITION STATUS  Other (Comment)(schizophrenia; no current psychotic or paranoid behaviors- has wandered in the past per previous group home owner)   Continent Diet  AMBULATORY STATUS COMMUNICATION OF NEEDS Skin   Independent Verbally Normal                       Personal Care Assistance Level of Assistance  Bathing, Feeding, Dressing Bathing Assistance: Independent Feeding assistance: Independent Dressing Assistance: Independent     Functional Limitations Info             SPECIAL CARE FACTORS FREQUENCY                       Contractures Contractures Info: Not present    Additional Factors Info  Code Status, Allergies Code Status Info: FULL Allergies Info: No Known  Allergies           Current Medications (07/21/2019):  This is the current hospital active medication list Current Facility-Administered Medications  Medication Dose Route Frequency Provider Last Rate Last Dose  . amLODipine (NORVASC) tablet 10 mg  10 mg Oral Daily Funke, Mary E, MD   10 mg at 07/21/19 1044  . cephALEXin (KEFLEX) capsule 500 mg  500 mg Oral Q8H Goodman, Graydon, MD   500 mg at 07/21/19 0641  . hydrochlorothiazide (MICROZIDE) capsule 12.5 mg  12.5 mg Oral Daily Funke, Mary E, MD   12.5 mg at 07/21/19 1044  . lisinopril (ZESTRIL) tablet 20 mg  20 mg Oral q morning - 10a Funke, Mary E, MD   20 mg at 07/21/19 1044  . QUEtiapine (SEROQUEL) tablet 200 mg  200 mg Oral QHS Cristofano, Paul A, MD   200 mg at 07/21/19 0010  . traZODone (DESYREL) tablet 100 mg  100 mg Oral QHS Cristofano, Paul A, MD   100 mg at 07/21/19 0010   Current Outpatient Medications  Medication Sig Dispense Refill  . amLODipine (NORVASC) 10 MG tablet Take 10 mg by mouth daily.    . ARIPiprazole ER (ABILIFY MAINTENA) 300 MG PRSY prefilled syringe Inject 300 mg into the muscle every 28 (twenty-eight) days.    . fesoterodine (TOVIAZ) 4 MG TB24 tablet Take 1 tablet (4 mg total) by mouth daily. 90 tablet 3  . hydrochlorothiazide (MICROZIDE) 12.5 MG capsule   Take 12.5 mg by mouth daily.    Marland Kitchen lisinopril (PRINIVIL,ZESTRIL) 20 MG tablet Take 20 mg by mouth every morning.    Marland Kitchen QUEtiapine (SEROQUEL) 200 MG tablet Take 1 tablet (200 mg total) by mouth at bedtime. 15 tablet 0  . traZODone (DESYREL) 100 MG tablet Take 1 tablet (100 mg total) by mouth at bedtime. 30 tablet 0  . cephALEXin (KEFLEX) 500 MG capsule Take 1 capsule (500 mg total) by mouth 3 (three) times daily. (Patient not taking: Reported on 10/26/2018) 12 capsule 0  . POTASSIUM CHLORIDE PO Take by mouth.       Discharge Medications: Please see discharge summary for a list of discharge medications.  Relevant Imaging Results:  Relevant Lab  Results:   Additional Information SSN: [id number]  Tania Haeven Nickle, LCSW

## 2019-07-21 NOTE — Social Work (Signed)
DSS guardian, Kalman Drape still working on placement.   CSW FL2 faxed to:    -[facility]       -[facility]       -[facility]       -[facility]       -[facility]       -[facility]       -[facility], [place] ED  [phone]

## 2019-07-21 NOTE — ED Notes (Signed)
VOL/Pending placement 

## 2019-07-21 NOTE — NC FL2 (Signed)
[place] MEDICAID FL2 LEVEL OF CARE SCREENING TOOL     IDENTIFICATION  Patient Name: Roberto Vasquez Birthdate: May 08, 1961 Sex: male Admission Date (Current Location): 07/13/2019  [place] and [place] Number:  [address] [id number] [facility] Facility and Address:  [facility], [address], [place], [place] [address]      Provider Number: [id number]  Attending Physician Name and Address:  No att. providers found  Relative Name and Phone Number:  DSS Legal Guardian Saintclair Halsted, office : [phone]    Current Level of Care: Hospital Recommended Level of Care: Assisted Living Facility Prior Approval Number:    Date Approved/Denied:   PASRR Number:    Discharge Plan: Other (Comment)(ALF)    Current Diagnoses: Patient Active Problem List   Diagnosis Date Noted  . UTI (urinary tract infection) 05/14/2018  . Seizure (HCC) 04/06/2018  . Tobacco use disorder 02/23/2017  . Schizophrenia (HCC) 02/20/2017  . Noncompliance 02/20/2017  . HTN (hypertension) 03/01/2015    Orientation RESPIRATION BLADDER Height & Weight     Self, Time, Place  Normal Continent Weight: 160 lb (72.6 kg) Height:  5\' 11"  (180.3 cm)  BEHAVIORAL SYMPTOMS/MOOD NEUROLOGICAL BOWEL NUTRITION STATUS  Other (Comment)(schizophrenia; no current psychotic or paranoid behaviors- has wandered in the past per previous group home owner)   Continent Diet  AMBULATORY STATUS COMMUNICATION OF NEEDS Skin   Independent Verbally Normal                       Personal Care Assistance Level of Assistance  Bathing, Feeding, Dressing Bathing Assistance: Independent Feeding assistance: Independent Dressing Assistance: Independent     Functional Limitations Info             SPECIAL CARE FACTORS FREQUENCY                       Contractures Contractures Info: Not present    Additional Factors Info  Code Status, Allergies Code Status Info: FULL Allergies Info: No Known  Allergies           Current Medications (07/21/2019):  This is the current hospital active medication list Current Facility-Administered Medications  Medication Dose Route Frequency Provider Last Rate Last Dose  . amLODipine (NORVASC) tablet 10 mg  10 mg Oral Daily 07/23/2019, MD   10 mg at 07/21/19 1044  . cephALEXin (KEFLEX) capsule 500 mg  500 mg Oral Q8H 07/23/19, MD   500 mg at 07/21/19 0641  . hydrochlorothiazide (MICROZIDE) capsule 12.5 mg  12.5 mg Oral Daily 07/23/19, MD   12.5 mg at 07/21/19 1044  . lisinopril (ZESTRIL) tablet 20 mg  20 mg Oral q morning - 10a 07/23/19, MD   20 mg at 07/21/19 1044  . QUEtiapine (SEROQUEL) tablet 200 mg  200 mg Oral QHS Cristofano, Paul A, MD   200 mg at 07/21/19 0010  . traZODone (DESYREL) tablet 100 mg  100 mg Oral QHS Cristofano, 07/23/19, MD   100 mg at 07/21/19 0010   Current Outpatient Medications  Medication Sig Dispense Refill  . amLODipine (NORVASC) 10 MG tablet Take 10 mg by mouth daily.    . ARIPiprazole ER (ABILIFY MAINTENA) 300 MG PRSY prefilled syringe Inject 300 mg into the muscle every 28 (twenty-eight) days.    . fesoterodine (TOVIAZ) 4 MG TB24 tablet Take 1 tablet (4 mg total) by mouth daily. 90 tablet 3  . hydrochlorothiazide (MICROZIDE) 12.5 MG capsule  Take 12.5 mg by mouth daily.    Marland Kitchen lisinopril (PRINIVIL,ZESTRIL) 20 MG tablet Take 20 mg by mouth every morning.    Marland Kitchen QUEtiapine (SEROQUEL) 200 MG tablet Take 1 tablet (200 mg total) by mouth at bedtime. 15 tablet 0  . traZODone (DESYREL) 100 MG tablet Take 1 tablet (100 mg total) by mouth at bedtime. 30 tablet 0  . cephALEXin (KEFLEX) 500 MG capsule Take 1 capsule (500 mg total) by mouth 3 (three) times daily. (Patient not taking: Reported on 10/26/2018) 12 capsule 0  . POTASSIUM CHLORIDE PO Take by mouth.       Discharge Medications: Please see discharge summary for a list of discharge medications.  Relevant Imaging Results:  Relevant Lab  Results:   Additional Information SSN: [id number]  Tania Jamarria Real, LCSW

## 2019-07-21 NOTE — ED Notes (Signed)
Patient observed lying in bed with eyes closed  Even, unlabored respirations observed   NAD pt appears to be sleeping  I will continue to monitor along with every 15 minute visual observations     

## 2019-07-21 NOTE — ED Notes (Signed)
Patient says that he can not walk and was given care by ED tech to change his diaper and clean him up

## 2019-07-22 DIAGNOSIS — F209 Schizophrenia, unspecified: Secondary | ICD-10-CM | POA: Diagnosis not present

## 2019-07-22 NOTE — ED Notes (Signed)
Meal tray given to patient.

## 2019-07-22 NOTE — ED Notes (Signed)
VOL  PENDING  PLACEMENT 

## 2019-07-22 NOTE — ED Notes (Signed)
RN and tech in room to change pt. Pt brief is completely dry. Pt refused offer for food, drink, and ice cream. Pt refuses assistance to the restroom.

## 2019-07-22 NOTE — ED Provider Notes (Signed)
-----------------------------------------   5:53 AM on 07/22/2019 -----------------------------------------   Blood pressure 107/85, pulse 96, temperature 98.3 F (36.8 C), temperature source Oral, resp. rate 17, height 5\' 11"  (1.803 m), weight 72.6 kg, SpO2 98 %.  The patient is sleeping at this time.  There have been no acute events since the last update.  Awaiting disposition plan from Behavioral Medicine and/or Social Work team(s).   Paulette Blanch, MD 07/22/19 [phone]

## 2019-07-22 NOTE — ED Notes (Signed)
Pt offered meal tray. Pt declines at this time  

## 2019-07-22 NOTE — ED Notes (Signed)
Pts meal tray taken in room. Pt is currently sleeping. Meal tray let on sink for when pt wakes up.

## 2019-07-22 NOTE — ED Notes (Signed)
Pt offered food and drink again. Pt declines. Pt denies any other needs at this time.

## 2019-07-22 NOTE — ED Notes (Signed)
This tech in to round on pt, pt laying quietly in his bed with no further needs at this time. Pt brief checked and pt remains dry. Pt offered to use restroom but to denied the need

## 2019-07-22 NOTE — ED Notes (Addendum)
This tech in to round on pt, pt laying quietly in his bed with no further needs at this time. Pt brief checked and pt remains dry. Pt offered to use restroom but to denied the need  

## 2019-07-23 DIAGNOSIS — F209 Schizophrenia, unspecified: Secondary | ICD-10-CM | POA: Diagnosis not present

## 2019-07-23 NOTE — ED Notes (Signed)
PT changed wet brief at this time independently. PT is clean and dry, linen dry

## 2019-07-23 NOTE — ED Notes (Signed)
Pt. Currently sleeping in bed. 

## 2019-07-23 NOTE — ED Notes (Signed)
Snack and soda given at this time per pt request

## 2019-07-23 NOTE — ED Notes (Signed)
Pt provided breakfast tray at this time. 

## 2019-07-23 NOTE — ED Notes (Signed)
Dinner tray provided at this time.

## 2019-07-23 NOTE — ED Provider Notes (Signed)
-----------------------------------------   9:00 AM on 07/23/2019 -----------------------------------------   BP 101/76 (BP Location: Right Arm)   Pulse 81   Temp 98.3 F (36.8 C) (Oral)   Resp 16   Ht 5\' 11"  (1.803 m)   Wt 72.6 kg   SpO2 100%   BMI 22.32 kg/m   The patient is calm and cooperative at this time.  There have been no acute events since the last update.  Awaiting disposition plan from Behavioral Medicine and/or Social Work team(s).   Lilia Pro., MD 07/23/19 0900

## 2019-07-23 NOTE — ED Notes (Signed)
Pt provided lunch tray at this time  

## 2019-07-23 NOTE — ED Notes (Signed)
Pt. Took meds and drank 8 oz of water.  Pt. Clean and dry at this time.

## 2019-07-24 DIAGNOSIS — F209 Schizophrenia, unspecified: Secondary | ICD-10-CM | POA: Diagnosis not present

## 2019-07-24 NOTE — ED Provider Notes (Signed)
-----------------------------------------   6:09 AM on 07/24/2019 -----------------------------------------   Blood pressure 122/88, pulse 75, temperature 98.8 F (37.1 C), resp. rate 16, height 5\' 11"  (1.803 m), weight 72.6 kg, SpO2 99 %.  The patient is sleeping at this time.  There have been no acute events since the last update.  Awaiting disposition plan from Behavioral Medicine and/or Social Work team(s).   Paulette Blanch, MD 07/24/19 [phone]

## 2019-07-24 NOTE — ED Notes (Signed)
Patient laying In bed watching tv. Bathroom offered.

## 2019-07-24 NOTE — ED Notes (Signed)
Resumed care from [place], [place]. Orders reviewed, pt resting.

## 2019-07-24 NOTE — ED Notes (Signed)
Pt awakened to take medication. He has not eaten breakfast and this morning's report is that he also did not eat dinner. Pt states he is not hungry. Asked pt to try and eat, but he refused, said he would try later, and went back to sleep.

## 2019-07-24 NOTE — ED Notes (Signed)
Walked with patient to restroom for toileting.

## 2019-07-24 NOTE — ED Notes (Signed)
Assumed care of patient as per endorsing nurse patient calm and cooperative refused breakfast. Presently in bed encouraged to drink and get out of bed to utilize bathroom.

## 2019-07-25 DIAGNOSIS — F209 Schizophrenia, unspecified: Secondary | ICD-10-CM | POA: Diagnosis not present

## 2019-07-25 NOTE — ED Notes (Signed)
BEHAVIORAL HEALTH ROUNDING Patient sleeping: No. Patient alert and oriented: yes Behavior appropriate: Yes.  ; If no, describe:  Nutrition and fluids offered: yes Toileting and hygiene offered: Yes  Sitter present: q15 minute observations  Law enforcement present: Yes [employer]  

## 2019-07-25 NOTE — ED Notes (Signed)

## 2019-07-25 NOTE — ED Notes (Signed)
BEHAVIORAL HEALTH ROUNDING Patient sleeping: No. Patient alert and oriented: yes Behavior appropriate: Yes.  ; If no, describe:  Nutrition and fluids offered: yes Toileting and hygiene offered: Yes  Sitter present: q15 minute observation  Law enforcement present: Yes  [employer]

## 2019-07-25 NOTE — ED Notes (Signed)
VOL/Pending Placement 

## 2019-07-25 NOTE — ED Notes (Signed)
Offered pt a shower. Pt stated "not right now." Advised pt to let myself or RN know when he was ready to shower. Pt agreed to do so.

## 2019-07-25 NOTE — ED Notes (Signed)
Pt. Alert and oriented, warm and dry, in no distress. Pt. Denies SI, HI, and AVH. Pt. Encouraged to let nursing staff know of any concerns or needs. 

## 2019-07-25 NOTE — ED Notes (Signed)
He has been walking around his room - peeking around the doorway - he has been observed being paranoid   Food provided at lunch - he looked at it and then sat it in the floor by the door   - I encouraged him to take a shower due to a urine odor in the room  Pt states  "I am clean - I don't want no shower   Just go girl - just go"   Pt reassured  Continue to monitor

## 2019-07-25 NOTE — ED Notes (Signed)
BEHAVIORAL HEALTH ROUNDING Patient sleeping: No. Patient alert : yes Behavior appropriate: Yes.  ; If no, describe:  Nutrition and fluids offered: yes Toileting and hygiene offered: Yes  Sitter present: q15 minute observations Law enforcement present: Yes    

## 2019-07-25 NOTE — ED Notes (Signed)
BEHAVIORAL HEALTH ROUNDING Patient sleeping: Yes.   Patient alert and oriented: eyes closed  Appears asleep Behavior appropriate: Yes.  ; If no, describe:  Nutrition and fluids offered: Yes  Toileting and hygiene offered: sleeping Sitter present: q 15 minute observations  Law enforcement present: yes    ENVIRONMENTAL ASSESSMENT Potentially harmful objects out of patient reach: Yes.   Personal belongings secured: Yes.   Patient dressed in hospital provided attire only: Yes.   Plastic bags out of patient reach: Yes.   Patient care equipment (cords, cables, call bells, lines, and drains) shortened, removed, or accounted for: Yes.   Equipment and supplies removed from bottom of stretcher: Yes.   Potentially toxic materials out of patient reach: Yes.   Sharps container removed or out of patient reach: Yes.   

## 2019-07-25 NOTE — ED Notes (Signed)
Pt observed lying in bed - watching TV   Pt visualized with NAD  No verbalized needs or concerns at this time  Continue to monitor 

## 2019-07-25 NOTE — Social Work (Signed)
Patient's DSS guardian, Kalman Drape,  called to provide updates. Rhesha shared that [employer], [place] denied patient, and that she is still waiting for an answer from [facility]. CSW faxed FL2 and places FL2 have been faxed to so guardian can follow-up.      Fax:  [phone].     [place], [place] [facility]  [phone]

## 2019-07-26 DIAGNOSIS — F209 Schizophrenia, unspecified: Secondary | ICD-10-CM | POA: Diagnosis not present

## 2019-07-26 NOTE — ED Notes (Signed)
VOL  PENDING  PLACEMENT 

## 2019-07-26 NOTE — ED Notes (Signed)
Diner tray provided. Patient declined. Patient encourage to drink and get out of bed to stretch his legs.

## 2019-07-26 NOTE — ED Notes (Signed)
Patient laying in bed watching TV, declined getting out of bed to go to bathroom. Patient encouraged to drink fluids.

## 2019-07-26 NOTE — ED Notes (Signed)
Assumed care of patient patient laying in bed calm and cooperative, refused his breakfast but did agree to drink some juice. Toileting was offered patient up out of bed to bathroom with no difficulties. Will continue to monitor.

## 2019-07-26 NOTE — ED Provider Notes (Signed)
-----------------------------------------   3:41 AM on 07/26/2019 -----------------------------------------   Blood pressure 109/74, pulse 71, temperature 98.5 F (36.9 C), resp. rate 18, height 5\' 11"  (1.803 m), weight 72.6 kg, SpO2 97 %.  The patient had no acute events since last update.  Calm and cooperative at this time.  Disposition is pending per Psychiatry/Behavioral Medicine team recommendations.     Rudene Re, MD 07/26/19 [phone]

## 2019-07-26 NOTE — ED Notes (Signed)
Pt given meal tray but does not want it right now. Sat on sink.

## 2019-07-27 DIAGNOSIS — F209 Schizophrenia, unspecified: Secondary | ICD-10-CM | POA: Diagnosis not present

## 2019-07-27 NOTE — ED Notes (Signed)
Hourly rounding reveals patient in room. No complaints, stable, in no acute distress. Q15 minute rounds and monitoring via Rover and Officer to continue.   

## 2019-07-27 NOTE — ED Notes (Signed)
Pt woke up by this RN and Waunita Schooner, NT. Pt lights turned on. Pt had voided on bed. Pt stood up and able to take clothes off for wipe bath. Pt bed linens changed. Pt given new brief. Pt given lunch and ate a few bites with 2 cups of apple juice. Pt calm, cooperative, and pleasant.

## 2019-07-27 NOTE — ED Notes (Signed)
Meal tray given but pt asleep.

## 2019-07-27 NOTE — ED Provider Notes (Signed)
-----------------------------------------   6:25 AM on 07/27/2019 -----------------------------------------   Blood pressure (!) 139/94, pulse 64, temperature 98.2 F (36.8 C), temperature source Oral, resp. rate 17, height 5\' 11"  (1.803 m), weight 72.6 kg, SpO2 98 %.  The patient is sleeping at this time.  There have been no acute events since the last update.  Awaiting disposition plan from Behavioral Medicine and/or Social Work team(s).   Paulette Blanch, MD 07/27/19 [phone]

## 2019-07-27 NOTE — ED Notes (Signed)
Pt woke up for meds. Shakes head no but then takes meds. Pt does not want meal tray. Pt covered back up and goes back to sleep.

## 2019-07-27 NOTE — ED Notes (Signed)
Report to include Situation, Background, Assessment, and Recommendations received from Jadeka RN. Patient alert and oriented, warm and dry, in no acute distress. Patient denies SI, HI, AVH and pain. Patient made aware of Q15 minute rounds and Rover and Officer presence for their safety. Patient instructed to come to me with needs or concerns.  

## 2019-07-27 NOTE — ED Notes (Signed)
Patient offered dinner tray and something to drink, he refused. Requested that light be turned out.

## 2019-07-28 NOTE — ED Notes (Signed)
Pt not awake at this time. Breakfast meal tray sat on sink in room.

## 2019-07-28 NOTE — ED Notes (Signed)
Hourly rounding reveals patient in room. No complaints, stable, in no acute distress. Q15 minute rounds and monitoring via Rover and Officer to continue.   

## 2019-07-28 NOTE — Social Work (Signed)
CSW contacted patient's DSS guardian, Kalman Drape concerning where she is on placing patient in an assisted living facility. Rhesha shared that she has been getting denials so far. [facility] stated that they will not take patient due to him need a monthly injection, and them not having enough nursing staff.  Rhesha stated that she is waiting on hearing back from [facility] in [place], [place].    [place], [place] [facility]  [phone]

## 2019-07-28 NOTE — ED Notes (Signed)
Pts brief was wet. Pt cleansed of incontinence by this tech and Amy, RN. Clean, dry brief put on pt. Pt given partial bed bath. Pt willing to assist with care with constant coaching. Pt position adjusted in bed. Will continue to monitor.

## 2019-07-28 NOTE — ED Provider Notes (Signed)
-----------------------------------------   6:54 AM on 07/28/2019 -----------------------------------------   Blood pressure 112/88, pulse 81, temperature 98.2 F (36.8 C), temperature source Oral, resp. rate 18, height 5\' 11"  (1.803 m), weight 72.6 kg, SpO2 100 %.  The patient is calm and cooperative at this time.  There have been no acute events since the last update.  Awaiting disposition plan from Behavioral Medicine and/or Social Work team(s).   Paulette Blanch, MD 07/28/19 [phone]

## 2019-07-29 DIAGNOSIS — F209 Schizophrenia, unspecified: Secondary | ICD-10-CM | POA: Diagnosis not present

## 2019-07-29 NOTE — ED Notes (Signed)
Pt. Alert and oriented.  Pt. Encouraged to eat with night time medications.  Pt. States "I am not hungry".  Pt. Given cookie from extra snack tray.  Pt. States "I will eat cookie".  Pt. Also drank 8 oz of water with medication.

## 2019-07-29 NOTE — ED Notes (Signed)
Breakfast tray was given with juice. 

## 2019-07-29 NOTE — ED Provider Notes (Signed)
12:43 AM 11/6  Blood pressure 120/86, pulse 78, temperature 98.2 F (36.8 C), temperature source Oral, resp. rate 18, height 5\' 11"  (1.803 m), weight 72.6 kg, SpO2 100 %.  The patient is calm and cooperative at this time.  There have been no acute events since the last update.  Awaiting disposition plan from Behavioral Medicine team.   Vanessa [place], MD 07/29/19 [phone]

## 2019-07-29 NOTE — ED Notes (Signed)
Meal tray provided.

## 2019-07-29 NOTE — ED Notes (Signed)
Pt refused to eat today. RN offered ice cream and applesauce. Pt still refused.

## 2019-07-29 NOTE — ED Notes (Signed)
Pt cleaned and brief changed by this Probation officer and Chartered certified accountant. Maintained on 15 minute checks and observation by security camera for safety.

## 2019-07-29 NOTE — ED Notes (Signed)
Pt. Given clothes to change into for return trip to facility.

## 2019-07-29 NOTE — ED Notes (Signed)
Pt. Laying on back in bed sleeping.  Outgoing nurse reports patient has not been eating much today.  Pt. Encouraged to eat this evening.

## 2019-07-30 DIAGNOSIS — F209 Schizophrenia, unspecified: Secondary | ICD-10-CM | POA: Diagnosis not present

## 2019-07-30 LAB — CBC WITH DIFFERENTIAL/PLATELET
Abs Immature Granulocytes: 0 10*3/uL (ref 0.00–0.07)
Basophils Absolute: 0 10*3/uL (ref 0.0–0.1)
Basophils Relative: 1 %
Eosinophils Absolute: 0.1 10*3/uL (ref 0.0–0.5)
Eosinophils Relative: 1 %
HCT: 45.7 % (ref 39.0–52.0)
Hemoglobin: 15.3 g/dL (ref 13.0–17.0)
Immature Granulocytes: 0 %
Lymphocytes Relative: 55 %
Lymphs Abs: 3.3 10*3/uL (ref 0.7–4.0)
MCH: 30.5 pg (ref 26.0–34.0)
MCHC: 33.5 g/dL (ref 30.0–36.0)
MCV: 91 fL (ref 80.0–100.0)
Monocytes Absolute: 0.6 10*3/uL (ref 0.1–1.0)
Monocytes Relative: 11 %
Neutro Abs: 1.9 10*3/uL (ref 1.7–7.7)
Neutrophils Relative %: 32 %
Platelets: 256 10*3/uL (ref 150–400)
RBC: 5.02 MIL/uL (ref 4.22–5.81)
RDW: 11.3 % — ABNORMAL LOW (ref 11.5–15.5)
WBC: 5.9 10*3/uL (ref 4.0–10.5)
nRBC: 0 % (ref 0.0–0.2)

## 2019-07-30 LAB — COMPREHENSIVE METABOLIC PANEL
ALT: 15 U/L (ref 0–44)
AST: 27 U/L (ref 15–41)
Albumin: 4.1 g/dL (ref 3.5–5.0)
Alkaline Phosphatase: 66 U/L (ref 38–126)
Anion gap: 11 (ref 5–15)
BUN: 33 mg/dL — ABNORMAL HIGH (ref 6–20)
CO2: 26 mmol/L (ref 22–32)
Calcium: 9.4 mg/dL (ref 8.9–10.3)
Chloride: 101 mmol/L (ref 98–111)
Creatinine, Ser: 1.81 mg/dL — ABNORMAL HIGH (ref 0.61–1.24)
GFR calc Af Amer: 46 mL/min — ABNORMAL LOW (ref >60)
GFR calc non Af Amer: 40 mL/min — ABNORMAL LOW (ref >60)
Glucose, Bld: 94 mg/dL (ref 70–99)
Potassium: 3.8 mmol/L (ref 3.5–5.1)
Sodium: 138 mmol/L (ref 135–145)
Total Bilirubin: 1.5 mg/dL — ABNORMAL HIGH (ref 0.3–1.2)
Total Protein: 8 g/dL (ref 6.5–8.1)

## 2019-07-30 LAB — LIPASE, BLOOD: Lipase: 34 U/L (ref 11–51)

## 2019-07-30 MED ORDER — SODIUM CHLORIDE 0.9 % IV BOLUS
1000.0000 mL | Freq: Once | INTRAVENOUS | Status: AC
  Administered 2019-07-30: 1000 mL via INTRAVENOUS

## 2019-07-30 MED ORDER — DEXTROSE-NACL 5-0.45 % IV SOLN
Freq: Once | INTRAVENOUS | Status: AC
  Administered 2019-07-30: 15:00:00 via INTRAVENOUS

## 2019-07-30 NOTE — ED Provider Notes (Signed)
-----------------------------------------   5:54 AM on 07/30/2019 -----------------------------------------   Blood pressure 116/87, pulse 77, temperature 98.1 F (36.7 C), temperature source Oral, resp. rate 16, height 5\' 11"  (1.803 m), weight 72.6 kg, SpO2 99 %.  The patient is sleeping at this time.  There have been no acute events since the last update.  Awaiting disposition plan from Behavioral Medicine and/or Social Work team(s).   Paulette Blanch, MD 07/30/19 [phone]

## 2019-07-30 NOTE — ED Notes (Signed)
Meal tray placed at pt bedside. Pt is refusing to eat or drink at this time

## 2019-07-30 NOTE — ED Notes (Signed)
Pt provided as sandwich tray and grape juice by staff

## 2019-07-30 NOTE — ED Notes (Signed)
Pt brief changed by this RN and Waunita Schooner, EDT. Pt was dry. Pt has not urinated this shift. Dr. Joni Fears is aware.

## 2019-07-30 NOTE — ED Notes (Signed)
RN in room to take pts vital signs and administer blood pressure medications. On first check pts systolic BP 76. Pts BP was rechecked and was 84/62. Blood pressure medications held. Dr. Joni Fears was made aware of pts BP and of pts refusal to eat or drink. Orders placed by Dr. Joni Fears for labs and IV fluids.

## 2019-07-30 NOTE — ED Notes (Signed)
This RN and Holiday representative in room to check patient brief. Pt bed found to be saturated in urine. Pt sheet, blankets, underpad, brief, gown, and pillow case were changed. Pt was washed with warm bath wipes. Pt skin intact at this time.

## 2019-07-30 NOTE — ED Notes (Signed)
Pt. Assisted by this nurse and tech, in changing patients undergarment.

## 2019-07-30 NOTE — ED Notes (Signed)
Pt up walking to the restroom with no assistance from staff, pt expresses no further needs at this time

## 2019-07-30 NOTE — ED Notes (Signed)
Pt stating that he wants his IV taken out. Pt informed that he needs the IV because blood pressure is low. Pt is stating that his IV is hurting. IV assessed, no redness or swelling noted.

## 2019-07-30 NOTE — ED Notes (Addendum)
Pt has remained in bed throughout shift today. Pt has been offered food and drink several times by staff today and has refused food or drink. Pt had episode of low blood pressure this morning, BP meds were held and IV fluids were given. Pt has not urinated at all during this shift. Pt is cooperative at this time.

## 2019-07-31 DIAGNOSIS — F209 Schizophrenia, unspecified: Secondary | ICD-10-CM | POA: Diagnosis not present

## 2019-07-31 LAB — BASIC METABOLIC PANEL WITH GFR
Anion gap: 8 (ref 5–15)
BUN: 24 mg/dL — ABNORMAL HIGH (ref 6–20)
CO2: 22 mmol/L (ref 22–32)
Calcium: 8.4 mg/dL — ABNORMAL LOW (ref 8.9–10.3)
Chloride: 110 mmol/L (ref 98–111)
Creatinine, Ser: 1.17 mg/dL (ref 0.61–1.24)
GFR calc Af Amer: >60 mL/min (ref >60)
GFR calc non Af Amer: >60 mL/min (ref >60)
Glucose, Bld: 87 mg/dL (ref 70–99)
Potassium: 4 mmol/L (ref 3.5–5.1)
Sodium: 140 mmol/L (ref 135–145)

## 2019-07-31 NOTE — ED Notes (Addendum)
Report received from previous EDT Scott, pt resting quietly on bed and expresses no further needs at this time. Will continue to monitor with q 15 min checks  

## 2019-07-31 NOTE — ED Notes (Signed)
Hourly rounding reveals patient sleeping in room. No complaints, stable, in no acute distress. Q15 minute rounds and monitoring via Rover and Officer to continue.  

## 2019-07-31 NOTE — ED Notes (Signed)
Hourly rounding reveals patient in room. No complaints, stable, in no acute distress. Q15 minute rounds and monitoring via Rover and Officer to continue.   

## 2019-07-31 NOTE — ED Notes (Addendum)
Report to include Situation, Background, Assessment, and Recommendations received from Sarah RN. Patient alert, warm and dry, in no acute distress. Patient denies SI, HI, AVH and pain. Patient made aware of Q15 minute rounds and Rover and Officer presence for their safety. Patient instructed to come to me with needs or concerns.  

## 2019-07-31 NOTE — ED Notes (Signed)
BP meds held due to borderline low BP and hypotensive episodes report by [facility]. Will continue to monitor.

## 2019-07-31 NOTE — ED Provider Notes (Signed)
-----------------------------------------   7:29 AM on 07/31/2019 -----------------------------------------   Blood pressure 99/66, pulse 62, temperature 97.7 F (36.5 C), temperature source Oral, resp. rate 16, height 5\' 11"  (1.803 m), weight 72.6 kg, SpO2 100 %.  The patient is calm and cooperative at this time.  There have been no acute events since the last update.  Awaiting disposition plan from Behavioral Medicine and/or Social Work team(s). We will have to make sure that this gentleman's blood pressure comes up when he wakes up more.   Nena Polio, MD 07/31/19 [phone]

## 2019-07-31 NOTE — ED Notes (Signed)
Pt asleep, lunch tray placed in rm. 

## 2019-07-31 NOTE — ED Notes (Signed)
Pt saturated in urine. Pt standing to be changed. Pt given new brief and able to apply to self. Pt sheets changed as well.

## 2019-07-31 NOTE — ED Notes (Signed)
Pt given meal tray.

## 2019-08-01 DIAGNOSIS — F209 Schizophrenia, unspecified: Secondary | ICD-10-CM | POA: Diagnosis not present

## 2019-08-01 NOTE — ED Notes (Signed)
Hourly rounding reveals patient sleeping in room. No complaints, stable, in no acute distress. Q15 minute rounds and monitoring via Rover and Officer to continue.  

## 2019-08-01 NOTE — Social Work (Signed)
Patient's DSS guardian, Lonia Chimera,  working on ALF placement.  CSW faxed patient out to [facility].    [place], [place] ED [phone]

## 2019-08-01 NOTE — ED Notes (Signed)
Pt accepts care provided but does not initiate self-care activities.

## 2019-08-01 NOTE — ED Notes (Signed)
Hourly rounding reveals patient in room. No complaints, stable, in no acute distress. Q15 minute rounds and monitoring via Rover and Officer to continue.   

## 2019-08-01 NOTE — ED Notes (Signed)
Pt stood at bedside and used urinal.  Allowed me to take vitals

## 2019-08-01 NOTE — ED Provider Notes (Signed)
-----------------------------------------   6:15 AM on 08/01/2019 -----------------------------------------   Blood pressure 108/86, pulse 63, temperature 97.7 F (36.5 C), temperature source Oral, resp. rate 16, height 5\' 11"  (1.803 m), weight 72.6 kg, SpO2 100 %.  The patient had no acute events since last update.  Calm and cooperative at this time.  Disposition is pending per Psychiatry/Behavioral Medicine team recommendations.     Alfred Levins, [place], MD 08/01/19 [phone]

## 2019-08-01 NOTE — ED Notes (Signed)
Hourly rounding reveals patient awake in room. No complaints, stable, in no acute distress. Q15 minute rounds and monitoring via Rover and Officer to continue.  

## 2019-08-01 NOTE — ED Notes (Signed)
Vol/Pending placement  

## 2019-08-01 NOTE — ED Notes (Addendum)
Pt cleaned, brief and linen changed by nurse tech.

## 2019-08-01 NOTE — ED Notes (Signed)
Pt refuses to let RN take a BP reading.

## 2019-08-02 DIAGNOSIS — F209 Schizophrenia, unspecified: Secondary | ICD-10-CM | POA: Diagnosis not present

## 2019-08-02 LAB — URINALYSIS, COMPLETE (UACMP) WITH MICROSCOPIC
Bilirubin Urine: NEGATIVE
Glucose, UA: NEGATIVE mg/dL
Hgb urine dipstick: NEGATIVE
Ketones, ur: NEGATIVE mg/dL
Nitrite: POSITIVE — AB
Protein, ur: NEGATIVE mg/dL
Specific Gravity, Urine: 1.012 (ref 1.005–1.030)
Squamous Epithelial / HPF: NONE SEEN (ref 0–5)
pH: 7 (ref 5.0–8.0)

## 2019-08-02 NOTE — ED Provider Notes (Signed)
-----------------------------------------   12:03 AM on 08/02/2019 -----------------------------------------   Blood pressure 115/88, pulse 66, temperature 98.1 F (36.7 C), temperature source Oral, resp. rate 15, height 5\' 11"  (1.803 m), weight 72.6 kg, SpO2 98 %.  The patient is calm and cooperative at this time.  There have been no acute events since the last update.  Awaiting disposition plan from Behavioral Medicine and/or Social Work team(s). Patient has been here for many hours.  He occasionally will refuse to get up to go the bathroom.  We will get him a PT consult.   Nena Polio, MD 08/02/19 [phone]

## 2019-08-02 NOTE — ED Notes (Signed)
Pt comfortable and has no needs at this time.  Will continue q15 min checks

## 2019-08-02 NOTE — ED Notes (Signed)
Hourly rounding reveals patient sleeping in room. No complaints, stable, in no acute distress. Q15 minute rounds and monitoring via Security to continue. 

## 2019-08-02 NOTE — ED Notes (Signed)
Patient was walking around with Physical Therapy

## 2019-08-02 NOTE — ED Notes (Signed)
Changed patients pads and clothing.

## 2019-08-02 NOTE — Evaluation (Signed)
Physical Therapy Evaluation Patient Details Name: Roberto Vasquez MRN: [id number] DOB: 1960/10/10 Today's Date: 08/02/2019   History of Present Illness  presented to ER from group home (10/26) secondary to aggressive and paranoid behavior, non-compliance with medical and self-care needs.  Clinical Impression  Upon evaluation, patient alert and oriented to basic information; follows simple commands, but does require increased encouragement and intermittent hand-over-hand to facilitate task initiation and overall participation with session.  Bilat UE/LE strength and ROM grossly symmetrical and WFL; no focal weakness, sensory deficit noted.  Good standing balance noted, with a patient demonstrating standing functional reach >6" with good control, awareness of limits of stability and ability to return to midline.  Able to complete bed mobility indep; sit/stand, basic transfers and gait (50') without assist device, sup/mod indep.  Displays reciprocal stepping pattern with fair cadence; decreased trunk rotation and L > R arm swing, but no overt buckling, LOB or  safety concern.  Completes head turns, change of direction and negotiation of narrowed spaces without difficulty. 5x sit/stand stand testing does require slightly increased time to complete (17-18 seconds); however, limited by cognitive ability to attend to task and overall willingness to participate vs strength/balance deficit. Appears to be at baseline level of functional ability; no acute PT needs identified at this time.  Will complete order; please re-consult should needs change.    Follow Up Recommendations No PT follow up    Equipment Recommendations       Recommendations for Other Services       Precautions / Restrictions Precautions Precautions: Fall      Mobility  Bed Mobility Overal bed mobility: Independent                Transfers Overall transfer level: Modified independent Equipment used: None              General transfer comment: sit/stand from bed surface without assist device, fair/good strength and power in bilat LEs  Ambulation/Gait Ambulation/Gait assistance: Supervision;Modified independent (Device/Increase time) Gait Distance (Feet): 50 Feet Assistive device: None       General Gait Details: reciprocal stepping pattern with fair cadence; decreased trunk rotation and L > R arm swing, but no overt buckling, LOB or  safety concern.  Completes heaad turns, change of direction and negotiation of narrowed spaces without difficulty.  Stairs            Wheelchair Mobility    Modified Rankin (Stroke Patients Only)       Balance Overall balance assessment: Modified Independent Sitting-balance support: Feet supported;No upper extremity supported Sitting balance-Leahy Scale: Normal     Standing balance support: No upper extremity supported Standing balance-Leahy Scale: Good Standing balance comment: standing functional reach >6" with good awareness of limits of stability, good stability/control with return to upright  5x sit/stand approx 18 seconds, close sup-limited by cognitive ability to attend to task (and overall willingness to oblige) vs balance/strength deficit.                           Pertinent Vitals/Pain Pain Assessment: No/denies pain    Home Living Family/patient expects to be discharged to:: Group home Living Arrangements: Group Home                    Prior Function Level of Independence: Independent         Comments: Per previous notes, indep without assist device; patient seems to agree, but offers limited  information/conversation with therapist     Hand Dominance        Extremity/Trunk Assessment   Upper Extremity Assessment Upper Extremity Assessment: Overall WFL for tasks assessed(grossly 4+/5 throughout bilat UEs)    Lower Extremity Assessment Lower Extremity Assessment: Overall WFL for tasks assessed(grossly  4+/5 throughout bilat UEs)       Communication      Cognition Arousal/Alertness: Awake/alert Behavior During Therapy: Flat affect Overall Cognitive Status: Within Functional Limits for tasks assessed                                 General Comments: oriented to self, general location; follows command with encouragement; pleasant and cooperative with tasks      General Comments      Exercises     Assessment/Plan    PT Assessment Patent does not need any further PT services  PT Problem List         PT Treatment Interventions      PT Goals (Current goals can be found in the Care Plan section)  Acute Rehab PT Goals PT Goal Formulation: All assessment and education complete, DC therapy Time For Goal Achievement: 08/02/19 Potential to Achieve Goals: Good    Frequency     Barriers to discharge        Co-evaluation               AM-PAC PT "6 Clicks" Mobility  Outcome Measure Help needed turning from your back to your side while in a flat bed without using bedrails?: None Help needed moving from lying on your back to sitting on the side of a flat bed without using bedrails?: None Help needed moving to and from a bed to a chair (including a wheelchair)?: None Help needed standing up from a chair using your arms (e.g., wheelchair or bedside chair)?: None Help needed to walk in hospital room?: None Help needed climbing 3-5 steps with a railing? : None 6 Click Score: 24    End of Session   Activity Tolerance: Patient tolerated treatment well Patient left: in bed;with call bell/phone within reach(nursing/sitter in hallway for monitoring)   PT Visit Diagnosis: Muscle weakness (generalized) (M62.81);Difficulty in walking, not elsewhere classified (R26.2)    Time: 1937-9024 PT Time Calculation (min) (ACUTE ONLY): 12 min   Charges:   PT Evaluation $PT Eval Low Complexity: 1 Low         Tarius Stangelo H. Manson Passey, PT, DPT, NCS 08/02/19, 9:58  AM [phone]

## 2019-08-02 NOTE — ED Notes (Signed)
VOL, pending placement 

## 2019-08-02 NOTE — ED Notes (Addendum)
Patient has been laid in the bed all day, he does not appear to be motivated to do walk and move around, he ate about 75% food today. Nurse went to talk with patient and asked how he was doing and he said im ok, asked him why he wasn't eating and getting up to use the bathroom, he said I dont know and attempted to change the subject

## 2019-08-03 DIAGNOSIS — F209 Schizophrenia, unspecified: Secondary | ICD-10-CM | POA: Diagnosis not present

## 2019-08-03 NOTE — ED Notes (Signed)
VOL/Pending placement 

## 2019-08-03 NOTE — ED Provider Notes (Signed)
-----------------------------------------   5:43 AM on 08/03/2019 -----------------------------------------   Blood pressure 100/74, pulse 64, temperature 98.2 F (36.8 C), temperature source Oral, resp. rate 16, height 5\' 11"  (1.803 m), weight 72.6 kg, SpO2 99 %.  The patient had no acute events since last update.  Calm and cooperative at this time.  Disposition is pending per Psychiatry/Behavioral Medicine team recommendations.     Rudene Re, MD 08/03/19 [phone]

## 2019-08-03 NOTE — ED Notes (Signed)
Gave pt a food tray with juice. Pt refused to eat just wants to sleep. Reported to [employer].

## 2019-08-03 NOTE — TOC Progression Note (Signed)
Transition of Care [facility]) - Progression Note    Patient Details  Name: Roberto Vasquez MRN: [id number] Date of Birth: June 29, 1961  Transition of Care [facility]) CM/SW Contact  Marshell Garfinkel, RN Phone Number: 08/03/2019, 8:24 AM  Clinical Narrative:     DSS legal guardian Kalman Drape [phone])- on vacation; [employer] [phone] to seek on call social worker- message left.      Expected Discharge Plan and Services                                                 Social Determinants of Health (SDOH) Interventions    Readmission Risk Interventions No flowsheet data found.

## 2019-08-03 NOTE — ED Notes (Signed)
Assumed care of patient as per prior nurse patient was out of bed this morning showered and changed of clothing provided. Patient continues to have episodes of incontinence. But will get up out of bed and go to bathroom or use urinal. Patient ate breakfast. Is currently lying in bed watching tv. Denies SI/HV/SI. Safety maintained, will continue to monitor. Patient continues to wait for placement.

## 2019-08-03 NOTE — ED Notes (Signed)
Pt. Resting on bed.  Pt. Has no questions or concerns at this time.

## 2019-08-04 DIAGNOSIS — F209 Schizophrenia, unspecified: Secondary | ICD-10-CM | POA: Diagnosis not present

## 2019-08-04 NOTE — ED Notes (Signed)
Pt ambulated by Dorian, NT down hallway.

## 2019-08-04 NOTE — ED Provider Notes (Signed)
-----------------------------------------   6:37 AM on 08/04/2019 -----------------------------------------   Blood pressure 98/61, pulse 69, temperature 97.8 F (36.6 C), temperature source Oral, resp. rate 17, height 5\' 11"  (1.803 m), weight 72.6 kg, SpO2 100 %.  The patient is sleeping at this time.  There have been no acute events since the last update.  Awaiting disposition plan from Behavioral Medicine and/or Social Work team(s).   Paulette Blanch, MD 08/04/19 [phone]

## 2019-08-04 NOTE — ED Notes (Signed)
Pt provided lunch tray at this time, pt sitting up eating

## 2019-08-04 NOTE — ED Notes (Signed)
Pt given dinner tray at this time, pt wants to sleep

## 2019-08-04 NOTE — ED Notes (Signed)
Report to include Situation, Background, Assessment, and Recommendations received from Jordan RN. Patient alert and oriented, warm and dry, in no acute distress. Patient denies SI, HI, AVH and pain. Patient made aware of Q15 minute rounds and Rover and Officer presence for their safety. Patient instructed to come to me with needs or concerns.   

## 2019-08-04 NOTE — ED Notes (Signed)
Hourly rounding reveals patient in room. No complaints, stable, in no acute distress. Q15 minute rounds and monitoring via Rover and Officer to continue.   

## 2019-08-04 NOTE — Social Work (Addendum)
CSW spoke with patient's guardian, Kalman Drape, for update on placement. Rhesha shared that she is still waiting on a decision from [facility], and is speaking with a [place] regarding the patient. Rhesha stated that she is also looking outside of the county for placement. CSW shared with Rhesha that home health can be set up for patient if he goes to a family care home.   CSW attempted to contact Brogan Nation from [place] [phone]) to see if she had a male bed available. Voicemail left.   11:30am - CSW attempted to contact [facility] [phone]) to see if they have a male bed available.   2:46pm - Jamas Lav from [facility] shared that she does not have a bed available but was informed by [facility] in [place] that they have a bed available and to fax FL2 to [phone]. Contact: Danae Chen [phone])    CSW faxed documents.     [place], [place] ED  [phone]

## 2019-08-04 NOTE — ED Notes (Signed)
VOL, pending placement 

## 2019-08-04 NOTE — ED Notes (Signed)
Pt provided breakfast tray at this time. 

## 2019-08-05 DIAGNOSIS — F209 Schizophrenia, unspecified: Secondary | ICD-10-CM | POA: Diagnosis not present

## 2019-08-05 NOTE — ED Notes (Signed)
Pt took medications PO without difficulty. This RN checked patient's brief to ensure patient was dry, pt noted to be dry, pt able to move himself in the bed. Pt requests lights be dimmed, lights dimmed for patient comfort.

## 2019-08-05 NOTE — ED Notes (Signed)
Hourly rounding reveals patient in room. No complaints, stable, in no acute distress. Q15 minute rounds and monitoring via Rover and Officer to continue.   

## 2019-08-05 NOTE — ED Notes (Signed)
Snack offered patient declined only wanted grape juice.

## 2019-08-05 NOTE — ED Notes (Signed)
Pt given meal tray at this time 

## 2019-08-05 NOTE — ED Notes (Signed)
Patient given scheduled meds. Patient took meds with no resistance.

## 2019-08-05 NOTE — ED Provider Notes (Signed)
-----------------------------------------   7:03 AM on 08/05/2019 -----------------------------------------   Blood pressure 107/82, pulse 63, temperature 97.7 F (36.5 C), temperature source Oral, resp. rate 15, height 5\' 11"  (1.803 m), weight 72.6 kg, SpO2 98 %.  The patient is calm and cooperative at this time.  There have been no acute events since the last update.  Awaiting disposition plan from Behavioral Medicine and/or Social Work team(s).    Merlyn Lot, MD 08/05/19 [phone]

## 2019-08-05 NOTE — ED Notes (Signed)
Patient resting snack. Chocolate ice cream provided.

## 2019-08-05 NOTE — ED Notes (Signed)
VOL  PENDING  PLACEMENT 

## 2019-08-05 NOTE — ED Notes (Signed)
assumed care of patient patient laying in bed dry with blanket over his head. When asked if he wanted to eat, patient declined. Safety maintaned, will continue to monitor. vss stable this morning.

## 2019-08-06 DIAGNOSIS — F209 Schizophrenia, unspecified: Secondary | ICD-10-CM | POA: Diagnosis not present

## 2019-08-06 NOTE — ED Notes (Signed)
Pt given meal tray.

## 2019-08-06 NOTE — ED Notes (Signed)
Hourly rounding reveals patient sleeping in room. No complaints, stable, in no acute distress. Q15 minute rounds and monitoring via Security Cameras to continue. 

## 2019-08-06 NOTE — ED Notes (Signed)
VOL/Pending Placement 

## 2019-08-06 NOTE — ED Notes (Signed)
Report to include Situation, Background, Assessment, and Recommendations received from Amy B. RN. Patient alert, warm and dry, in no acute distress. Patient denies SI, HI, AVH and pain. Patient made aware of Q15 minute rounds and security cameras for their safety. Patient instructed to come to me with needs or concerns. 

## 2019-08-06 NOTE — ED Notes (Signed)
Pt awoken easily. Pt brief dry at this time and denies need to go to restroom. Pt eating breakfast at this time.

## 2019-08-06 NOTE — ED Provider Notes (Signed)
-----------------------------------------   7:01 AM on 08/06/2019 -----------------------------------------   Blood pressure 94/62, pulse 86, temperature (!) 96.8 F (36 C), temperature source Oral, resp. rate 18, height 5\' 11"  (0.721 m), weight 72.6 kg, SpO2 99 %.  The patient is calm and cooperative at this time.  There have been no acute events since the last update.  Awaiting disposition plan from Behavioral Medicine and/or Social Work team(s).   Carrie Mew, MD 08/06/19 [phone]

## 2019-08-06 NOTE — ED Notes (Signed)
Pt changed of wet brief. Pt able to stand up and help change his own brief. Pt gets into wheelchair independently with direction.

## 2019-08-06 NOTE — ED Notes (Signed)
Gave patient [place] sandwich tray with gingerale.AS

## 2019-08-07 ENCOUNTER — Emergency Department: Payer: Medicare Other

## 2019-08-07 DIAGNOSIS — F209 Schizophrenia, unspecified: Secondary | ICD-10-CM | POA: Diagnosis not present

## 2019-08-07 LAB — CBC WITH DIFFERENTIAL/PLATELET
Abs Immature Granulocytes: 0.01 10*3/uL (ref 0.00–0.07)
Basophils Absolute: 0 10*3/uL (ref 0.0–0.1)
Basophils Relative: 1 %
Eosinophils Absolute: 0.1 10*3/uL (ref 0.0–0.5)
Eosinophils Relative: 1 %
HCT: 41.5 % (ref 39.0–52.0)
Hemoglobin: 14.7 g/dL (ref 13.0–17.0)
Immature Granulocytes: 0 %
Lymphocytes Relative: 60 %
Lymphs Abs: 3.2 10*3/uL (ref 0.7–4.0)
MCH: 30.8 pg (ref 26.0–34.0)
MCHC: 35.4 g/dL (ref 30.0–36.0)
MCV: 86.8 fL (ref 80.0–100.0)
Monocytes Absolute: 0.5 10*3/uL (ref 0.1–1.0)
Monocytes Relative: 9 %
Neutro Abs: 1.5 10*3/uL — ABNORMAL LOW (ref 1.7–7.7)
Neutrophils Relative %: 29 %
Platelets: 227 10*3/uL (ref 150–400)
RBC: 4.78 MIL/uL (ref 4.22–5.81)
RDW: 11.1 % — ABNORMAL LOW (ref 11.5–15.5)
WBC: 5.3 10*3/uL (ref 4.0–10.5)
nRBC: 0 % (ref 0.0–0.2)

## 2019-08-07 LAB — COMPREHENSIVE METABOLIC PANEL
ALT: 12 U/L (ref 0–44)
AST: 18 U/L (ref 15–41)
Albumin: 3.8 g/dL (ref 3.5–5.0)
Alkaline Phosphatase: 55 U/L (ref 38–126)
Anion gap: 10 (ref 5–15)
BUN: 21 mg/dL — ABNORMAL HIGH (ref 6–20)
CO2: 27 mmol/L (ref 22–32)
Calcium: 9.4 mg/dL (ref 8.9–10.3)
Chloride: 103 mmol/L (ref 98–111)
Creatinine, Ser: 1.59 mg/dL — ABNORMAL HIGH (ref 0.61–1.24)
GFR calc Af Amer: 54 mL/min — ABNORMAL LOW (ref >60)
GFR calc non Af Amer: 46 mL/min — ABNORMAL LOW (ref >60)
Glucose, Bld: 87 mg/dL (ref 70–99)
Potassium: 3.6 mmol/L (ref 3.5–5.1)
Sodium: 140 mmol/L (ref 135–145)
Total Bilirubin: 1.8 mg/dL — ABNORMAL HIGH (ref 0.3–1.2)
Total Protein: 7.5 g/dL (ref 6.5–8.1)

## 2019-08-07 MED ORDER — SODIUM CHLORIDE 0.9 % IV BOLUS
1000.0000 mL | Freq: Once | INTRAVENOUS | Status: AC
  Administered 2019-08-08: 1000 mL via INTRAVENOUS

## 2019-08-07 NOTE — ED Notes (Signed)
Hourly rounding reveals patient sleeping in room. No complaints, stable, in no acute distress. Q15 minute rounds and monitoring via Security Cameras to continue. 

## 2019-08-07 NOTE — ED Provider Notes (Signed)
-----------------------------------------   11:29 PM on 08/07/2019 -----------------------------------------  BHU nurse concern for patient not eating or drinking today.  Has not been out of bed.  I see repeat lab work was done this morning which shows dehydration.  Will check blood culture, lactic acid, urine as he has been on Keflex for UTI, chest x-ray.  Will administer 1 L IV fluids.  Consult to dietitian and physical therapy.   ----------------------------------------- 3:31 AM on 08/08/2019 -----------------------------------------  Appears patient's Keflex was from 10/22. Will give IV Rocephin and start Keflex scheduled three times daily.  Chest x-ray negative for pneumonia.   ----------------------------------------- 4:49 AM on 08/08/2019 -----------------------------------------  Patient has perked up after IV fluids and is currently eating a sandwich tray and ice cream.  Lactic acid unremarkable.   Paulette Blanch, MD 08/08/19 [phone]

## 2019-08-07 NOTE — ED Notes (Signed)
Patient does not want his snack. He is also refusing a drink. Does not want to go to the bathroom and does not want to get up and sit in the day room or in a chair. Encouraged patient to tell me what if anything was bothering him. He says nothing is bothering him and he does not have any pain. Will notify charge nurse as well as EDP

## 2019-08-07 NOTE — ED Notes (Signed)
Pt provided with sandwich tray for night time snack and sprite to drink, pt expresses no further needs at this time. Will continue to monitor via q 15 min checks and security surveillance  

## 2019-08-07 NOTE — ED Notes (Addendum)
Pt sat on the side of his bed to eat lunch.  Pt also drank 2 cups of water.  Maintained on 15 minute checks and observation by security camera for safety.

## 2019-08-07 NOTE — ED Notes (Signed)
Brief checked and toilet offered. 

## 2019-08-07 NOTE — ED Notes (Signed)
Brief checked and toilet offered.

## 2019-08-07 NOTE — ED Notes (Signed)
Pt given meal tray.

## 2019-08-07 NOTE — ED Notes (Signed)
Pt given lunch meal tray and drink.  

## 2019-08-08 DIAGNOSIS — F209 Schizophrenia, unspecified: Secondary | ICD-10-CM | POA: Diagnosis not present

## 2019-08-08 LAB — LACTIC ACID, PLASMA: Lactic Acid, Venous: 1.8 mmol/L (ref 0.5–1.9)

## 2019-08-08 MED ORDER — CEPHALEXIN 500 MG PO CAPS
500.0000 mg | ORAL_CAPSULE | Freq: Three times a day (TID) | ORAL | Status: AC
  Administered 2019-08-08 – 2019-08-15 (×19): 500 mg via ORAL
  Filled 2019-08-08 (×20): qty 1

## 2019-08-08 MED ORDER — SODIUM CHLORIDE 0.9 % IV BOLUS
1000.0000 mL | Freq: Once | INTRAVENOUS | Status: AC
  Administered 2019-08-08: 1000 mL via INTRAVENOUS

## 2019-08-08 MED ORDER — SODIUM CHLORIDE 0.9 % IV SOLN
1.0000 g | Freq: Once | INTRAVENOUS | Status: AC
  Administered 2019-08-08: 1 g via INTRAVENOUS
  Filled 2019-08-08: qty 10

## 2019-08-08 NOTE — ED Notes (Signed)
Lab successfully obtained blood cultures and first lactic acid.

## 2019-08-08 NOTE — ED Notes (Signed)
PT pants and underwear checked at this time, pt is clean and dry.

## 2019-08-08 NOTE — ED Notes (Signed)
PT worked with pt, pt walked well.

## 2019-08-08 NOTE — Progress Notes (Signed)
PT Screen Note  Patient Details Name: Roberto Vasquez MRN: [id number] DOB: 1961-08-23   Cancelled Treatment:    Reason Eval/Treat Not Completed: PT screened, no needs identified, will sign off  Pt seen by PT last week, discharged in house with no needs.  New referral, ostensibly because of weakness 2/2 dehydration/lack of eating.  Pt was able to to hop off foot of bed with 1 continuous motion and easily walk >100 ft w/o AD or hesitation; he had consistent cadence with community appropriate speed, no LOBs or safety issues and reports feeling at or near his normal.  Pt remains safe for d/c from PT perspective and does not require further PT intervention, will sign off.   Kreg Shropshire, DPT 08/08/2019, 12:29 PM

## 2019-08-08 NOTE — ED Notes (Signed)
Called lab to attempt to draw blood cultures before giving Rocephin

## 2019-08-08 NOTE — ED Notes (Signed)
PT denies being wet or needing to use restroom.

## 2019-08-08 NOTE — ED Notes (Signed)
Heather, rn also unable to obtain blood samples. Lab notified of need for venipuncture assistance.

## 2019-08-08 NOTE — ED Notes (Signed)
Dinner tray provided at this time.

## 2019-08-08 NOTE — ED Notes (Signed)
Pt drank 246mL milk, ate entire cup of ice cream, bag of chips, crackers, banana, and [place] sandwich.

## 2019-08-08 NOTE — ED Notes (Signed)
Attempt iv initiation x2 without success.  ?

## 2019-08-08 NOTE — ED Notes (Signed)
Phlebotomist at bedside to attempt drawing blood cultures

## 2019-08-08 NOTE — ED Notes (Signed)
Pt. Alert and oriented, warm and dry, in no distress. Pt. Denies SI, HI, and AVH. Pt. Encouraged to let nursing staff know of any concerns or needs. 

## 2019-08-08 NOTE — ED Notes (Signed)
Lab unsuccessful at obtaining blood samples, dr. Beather Arbour notified. Order for additional NS bolus received.

## 2019-08-08 NOTE — ED Notes (Signed)
Pt provided breakfast tray, pt sitting up and eating at this time

## 2019-08-08 NOTE — ED Notes (Signed)
Pt provided lunch tray at this time; able to feed self.

## 2019-08-08 NOTE — ED Notes (Signed)
Pt provided with sandwich tray, chocolate milk and icecream. Pt states is very hungry and thirsty.

## 2019-08-08 NOTE — ED Notes (Signed)
Pt with flat affect, declines to answer most questions presented. Skin tenting noted, dry oral mucus membranes and poor oral hygeine noted. Pt declines offer to brush teeth. Dry flaking skin noted to right palm. Pt states he just "doesn't want to get up". Pt does not make eye contact with RN. Pt moving all extremities without difficulty.

## 2019-08-08 NOTE — Social Work (Addendum)
CSW contacted [place] at [facility]. She asked CSW to fax the [facility] again to [phone]. Fax has been sent. CSW waiting on return phone call from [place].   1:30pm - Mahala Menghini [phone]) with [facility] shared that she is planning on coming to interview patient tomorrow (11/17). Alanson Puls will call CSW back with a time.   3:29pm- Darlene from [facility] [phone]) shared that she will set up appointment to come in for an interview if other facility does not take patient.   [place], [place] ED  [phone]

## 2019-08-09 DIAGNOSIS — F209 Schizophrenia, unspecified: Secondary | ICD-10-CM | POA: Diagnosis not present

## 2019-08-09 NOTE — ED Notes (Signed)
Drink provided to pt.

## 2019-08-09 NOTE — ED Notes (Signed)
Call received from Dietitian reports she does not do consults in the ER. Dietitian advised to call Md and make them aware of her concerns. Dr. Nickolas Madrid was updated to Dietitians concerns of not being able to do diet consults in ED.

## 2019-08-09 NOTE — ED Notes (Signed)
VOL  PENDING  PLACEMENT 

## 2019-08-09 NOTE — ED Notes (Signed)
Drink and crackers provided to pt

## 2019-08-09 NOTE — ED Notes (Signed)
Patient walking around is bed, having full blown pleasant coherent conversations sharing stories about where he grew up in [place], spoke about people he knew in [place], requesting shower and change of clothing this morning. Patient ate 100% of his breakfast requested another tray, patient reports woke up very hungry today.second breakfast provided, patient ate 100%. Took meds with no difficulties.Vss, TV on in room. Will continue to monitor.

## 2019-08-09 NOTE — ED Notes (Signed)

## 2019-08-09 NOTE — ED Provider Notes (Signed)
-----------------------------------------   5:18 AM on 08/09/2019 -----------------------------------------   BP 91/65 (BP Location: Right Arm)   Pulse 61   Temp 98.2 F (36.8 C) (Oral)   Resp 16   Ht 5\' 11"  (1.803 m)   Wt 72.6 kg   SpO2 95%   BMI 22.32 kg/m   The patient is calm and cooperative at this time.  There have been no acute events since the last update.  Awaiting disposition plan from Behavioral Medicine and/or Social Work team(s).   Lilia Pro., MD 08/09/19 [phone]

## 2019-08-09 NOTE — ED Notes (Signed)
Pt given cup of grape juice, saltines and peanut butter per request.

## 2019-08-09 NOTE — ED Notes (Addendum)
Pt ambulatory to bathroom. Pt incontinent of urine. Clean pair of pants given to pt.

## 2019-08-09 NOTE — ED Notes (Signed)
Pt. Alert and oriented, warm and dry, in no distress. Pt. Denies SI, HI, and AVH. Pt. Encouraged to let nursing staff know of any concerns or needs. 

## 2019-08-09 NOTE — ED Notes (Signed)
Pt given supplies to take a shower. Bed linen changed. Pt in shower and is able to shower independently.

## 2019-08-09 NOTE — ED Notes (Signed)
Pt given lunch tray.

## 2019-08-09 NOTE — Social Work (Signed)
Ciara Singletary from [facility] will be here tomorrow (11/17) to interview patient. She states that she will come "after breakfast" time.  Alanson Puls will call ED CSW when she gets here.     [place], [place] ED  [phone]

## 2019-08-09 NOTE — ED Notes (Signed)
Pt given breakfast tray

## 2019-08-09 NOTE — ED Notes (Signed)
Patient walking around in his room, continues to be in good spirits. Very talkative when Probation officer goes into room. TV on, safety maintained.

## 2019-08-09 NOTE — ED Notes (Signed)
patient ate 100% of his lunch, placed his tray right outside of his room door.

## 2019-08-09 NOTE — ED Notes (Signed)
Pt still hungry after eating 100% of breakfast tray. Pt given [place] sandwich tray and grape juice.

## 2019-08-10 DIAGNOSIS — F209 Schizophrenia, unspecified: Secondary | ICD-10-CM | POA: Diagnosis not present

## 2019-08-10 NOTE — ED Notes (Signed)
Pt cleaned and changed and fresh linen given by ED Tech.

## 2019-08-10 NOTE — ED Notes (Signed)
Pt asleep, lunch tray placed in rm. 

## 2019-08-10 NOTE — ED Notes (Signed)
Pt. Is currently sleeping in bed.  Assess pt. When awakens or when medications due.
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
## 2019-08-10 NOTE — ED Notes (Signed)
Upon waking for evening medications, pt. Was soiled in urine.  Pt. Able to ambulate to bathroom and change into clean and dry burgundy scrubs.  Patient's bed linens changed, pt. Made comfortable.

## 2019-08-10 NOTE — ED Provider Notes (Signed)
-----------------------------------------   4:53 AM on 08/10/2019 -----------------------------------------   Blood pressure 132/77, pulse 74, temperature 97.8 F (36.6 C), temperature source Oral, resp. rate 17, height 1.803 m (5\' 11" ), weight 72.6 kg, SpO2 100 %.  The patient is calm and cooperative at this time.  There have been no acute events since the last update.  Awaiting disposition plan from Behavioral Medicine and/or Social Work team(s).   Hinda Kehr, MD 08/10/19 [phone]

## 2019-08-11 DIAGNOSIS — F209 Schizophrenia, unspecified: Secondary | ICD-10-CM | POA: Diagnosis not present

## 2019-08-11 LAB — BASIC METABOLIC PANEL
Anion gap: 8 (ref 5–15)
BUN: 13 mg/dL (ref 6–20)
CO2: 26 mmol/L (ref 22–32)
Calcium: 8.6 mg/dL — ABNORMAL LOW (ref 8.9–10.3)
Chloride: 104 mmol/L (ref 98–111)
Creatinine, Ser: 1.19 mg/dL (ref 0.61–1.24)
GFR calc Af Amer: >60 mL/min (ref >60)
GFR calc non Af Amer: >60 mL/min (ref >60)
Glucose, Bld: 100 mg/dL — ABNORMAL HIGH (ref 70–99)
Potassium: 3.6 mmol/L (ref 3.5–5.1)
Sodium: 138 mmol/L (ref 135–145)

## 2019-08-11 LAB — CBC
HCT: 36.2 % — ABNORMAL LOW (ref 39.0–52.0)
Hemoglobin: 12.8 g/dL — ABNORMAL LOW (ref 13.0–17.0)
MCH: 31 pg (ref 26.0–34.0)
MCHC: 35.4 g/dL (ref 30.0–36.0)
MCV: 87.7 fL (ref 80.0–100.0)
Platelets: 182 10*3/uL (ref 150–400)
RBC: 4.13 MIL/uL — ABNORMAL LOW (ref 4.22–5.81)
RDW: 11.2 % — ABNORMAL LOW (ref 11.5–15.5)
WBC: 4.9 10*3/uL (ref 4.0–10.5)
nRBC: 0 % (ref 0.0–0.2)

## 2019-08-11 LAB — LACTIC ACID, PLASMA: Lactic Acid, Venous: 1.9 mmol/L (ref 0.5–1.9)

## 2019-08-11 MED ORDER — SODIUM CHLORIDE 0.9 % IV BOLUS: 500.0000 mL | Freq: Once | INTRAVENOUS | Status: DC

## 2019-08-11 NOTE — ED Notes (Signed)
Hourly rounding reveals patient in room. No complaints, stable, in no acute distress. Q15 minute rounds and monitoring via Rover and Officer to continue.   

## 2019-08-11 NOTE — ED Notes (Signed)
Pt sleeping. Meal tray placed at bedside.

## 2019-08-11 NOTE — ED Notes (Signed)
Pt. Woke and used bathroom.  Pt. Has no concerns or complaints.  Pt. Given drink with meds and encouraged to drink.

## 2019-08-11 NOTE — ED Notes (Signed)
Pt refusing antibiotic. RN will attempt to administer again in a few minutes.

## 2019-08-11 NOTE — ED Provider Notes (Signed)
Vitals:   08/11/19 1041 08/11/19 1426  BP: (!) 86/58 95/70  Pulse: 78 85  Resp: 18 18  Temp:  98.1 F (36.7 C)  SpO2: 99% 98%     Patient up to the shower, noted to have hypotension on repeat blood pressure assessment.  He has recently been treated for urinary tract infection, yes urine culture and blood cultures are negative.  He is afebrile.  Otherwise asymptomatic, but will repeat CBC, basic metabolic panel and lactic acid today to evaluate ensure there is no evidence of advancing infection.  500 mL bolus ordered, plan to reassess thereafter  Reassessment patient shows blood pressure improving.  Lab work reassuring, no evidence of sepsis denoted.  Patient had recent urine culture and urinalysis appears to show improvement  Patient will continue to be monitored closely during his ER stay.  Signed out evaluation and the patient's episode of hypotension and discontinuation of amlodipine to my partner Dr. Cherylann Banas at this time   Delman Kitten, MD 08/11/19 1506

## 2019-08-11 NOTE — ED Notes (Signed)
Pt needing new IV, but states he will not allow a new IV to be placed. EDP made aware.  Pt needed encouragement to allow RN to remove old IV.

## 2019-08-11 NOTE — ED Notes (Signed)
Report to include Situation, Background, Assessment, and Recommendations received from Amy RN. Patient alert and oriented, warm and dry, in no acute distress. Patient denies SI, HI, AVH and pain. Patient made aware of Q15 minute rounds and Rover and Officer presence for their safety. Patient instructed to come to me with needs or concerns.   

## 2019-08-11 NOTE — Social Work (Addendum)
CSW attempted to contact [place] from [facility] [phone]) to see if she was still interested in interview patient.  Voicemail not set up. CSW will continue to call/find another contact number.   Update: No answer from [facility]. No option to leave voicemail.   [place], [place] ED  [phone]

## 2019-08-12 DIAGNOSIS — F209 Schizophrenia, unspecified: Secondary | ICD-10-CM | POA: Diagnosis not present

## 2019-08-12 NOTE — ED Notes (Signed)
Hourly rounding reveals patient in room. No complaints, stable, in no acute distress. Q15 minute rounds and monitoring via Rover and Officer to continue.   

## 2019-08-12 NOTE — ED Notes (Signed)
Writer spoke with patient to get him to use the bathroom, patient has not been up today to use the restroom, Probation officer asked patient what was going on with him and he said he was just sad. When asked what was making him sad, asked him if he had any family and he stated they live in [place] and are not close, asked him if he had any children and he said two but he doesn't talk with them anymore. Writer informed him the importance of eating and assisted him with eating his dinner meal, patient ate a few french fries and a few chicken tenders

## 2019-08-12 NOTE — Care Management (Signed)
11/20-Call to [place] from [place] [facility] [phone])  to see if they can still interview patient-no answer and no option to leave voicemail.

## 2019-08-12 NOTE — ED Notes (Signed)
Pt. Made comfortable in bed, light turned off.  Pt. Drank half cup 4oz of grape juice.

## 2019-08-12 NOTE — ED Provider Notes (Signed)
-----------------------------------------   6:12 AM on 08/12/2019 -----------------------------------------   Blood pressure 120/85, pulse 69, temperature 98.2 F (36.8 C), temperature source Oral, resp. rate 18, height 5\' 11"  (1.803 m), weight 72.6 kg, SpO2 100 %.  The patient is sleeping at this time.  There have been no acute events since the last update.  Awaiting disposition plan from Behavioral Medicine and/or Social Work team(s).   Paulette Blanch, MD 08/12/19 [phone]

## 2019-08-12 NOTE — ED Notes (Signed)
Pt. Urinated on self, pt. Assisted by tech in changing linens on bed and new change of scrubs.  Pt. Calm and cooperative.

## 2019-08-12 NOTE — ED Notes (Signed)
Patient ate 2 packs of graham crackers with a cup of water with his morning medication

## 2019-08-12 NOTE — ED Notes (Signed)
Patient refused a shower.

## 2019-08-12 NOTE — ED Notes (Signed)
Writer had a discussion with patient about eating, patient did not want to eat breakfast, writer informed patient that he has to eat something, put food on his stomach when he is taking medications. Writer also asked patient about moving around and getting out of bed. Patient stated he did not feel like moving, he said he just wanted to lay there.

## 2019-08-12 NOTE — ED Notes (Signed)
Hourly rounding reveals patient sleeping in room. No complaints, stable, in no acute distress. Q15 minute rounds and monitoring via [employer] to continue.

## 2019-08-13 DIAGNOSIS — F209 Schizophrenia, unspecified: Secondary | ICD-10-CM | POA: Diagnosis not present

## 2019-08-13 LAB — CULTURE, BLOOD (ROUTINE X 2)
Culture: NO GROWTH
Culture: NO GROWTH
Special Requests: ADEQUATE
Special Requests: ADEQUATE

## 2019-08-13 NOTE — ED Provider Notes (Signed)
-----------------------------------------   7:05 AM on 08/13/2019 -----------------------------------------   Blood pressure 124/80, pulse 61, temperature 97.9 F (36.6 C), temperature source Oral, resp. rate 18, height 5\' 11"  (1.803 m), weight 72.6 kg, SpO2 98 %.  The patient is calm and cooperative at this time.  There have been no acute events since the last update.  Awaiting disposition plan from Behavioral Medicine and/or Social Work team(s).    Merlyn Lot, MD 08/13/19 [phone]

## 2019-08-13 NOTE — ED Notes (Signed)
BEHAVIORAL HEALTH ROUNDING Patient sleeping: Yes.   Patient alert and oriented: eyes closed  Appears asleep Behavior appropriate: Yes.  ; If no, describe:  Nutrition and fluids offered: Yes  Toileting and hygiene offered: sleeping Sitter present: q 15 minute observations  Law enforcement present: yes  ACSD 

## 2019-08-13 NOTE — ED Notes (Signed)
BEHAVIORAL HEALTH ROUNDING Patient sleeping: Yes.   Patient alert and oriented: eyes closed  Appears asleep Behavior appropriate: Yes.  ; If no, describe:  Nutrition and fluids offered: Yes  Toileting and hygiene offered: sleeping Sitter present: q 15 minute observations  Law enforcement present: yes  Programmer, applications  ENVIRONMENTAL ASSESSMENT Potentially harmful objects out of patient reach: Yes.   Personal belongings secured: Yes.   Patient dressed in hospital provided attire only: Yes.   Plastic bags out of patient reach: Yes.   Patient care equipment (cords, cables, call bells, lines, and drains) shortened, removed, or accounted for: Yes.   Equipment and supplies removed from bottom of stretcher: Yes.   Potentially toxic materials out of patient reach: Yes.   Sharps container removed or out of patient reach: Yes.

## 2019-08-13 NOTE — ED Notes (Signed)
Pt. Currently sleeping in bed with lights on.  Pt. Awaiting placement.

## 2019-08-13 NOTE — ED Notes (Signed)
Patient observed lying in bed with eyes closed  Even, unlabored respirations observed   NAD pt appears to be sleeping  I will continue to monitor along with every 15 minute visual observations     

## 2019-08-13 NOTE — ED Notes (Signed)
BEHAVIORAL HEALTH ROUNDING Patient sleeping: Yes.   Patient alert and oriented: eyes closed  Appears asleep Behavior appropriate: Yes.  ; If no, describe:  Nutrition and fluids offered: Yes  Toileting and hygiene offered: sleeping Sitter present: q 15 minute observations  Law enforcement present: yes  [employer]   ENVIRONMENTAL ASSESSMENT Potentially harmful objects out of patient reach: Yes.   Personal belongings secured: Yes.   Patient dressed in hospital provided attire only: Yes.   Plastic bags out of patient reach: Yes.   Patient care equipment (cords, cables, call bells, lines, and drains) shortened, removed, or accounted for: Yes.   Equipment and supplies removed from bottom of stretcher: Yes.   Potentially toxic materials out of patient reach: Yes.   Sharps container removed or out of patient reach: Yes.   

## 2019-08-13 NOTE — ED Notes (Signed)
BEHAVIORAL HEALTH ROUNDING Patient sleeping: No. Patient alert : yes Behavior appropriate: Yes.  ; If no, describe:  Nutrition and fluids offered: yes Toileting and hygiene offered: Yes  Sitter present: q15 minute observations Law enforcement present: Yes   [employer]  

## 2019-08-13 NOTE — ED Notes (Signed)
ED  Is the patient under IVC or is there intent for IVC: voluntary  Is the patient medically cleared: Yes.   Is there vacancy in the ED BHU: Yes.   Is the population mix appropriate for patient: Yes.   Is the patient awaiting placement in inpatient or outpatient setting: Yes.  Social work is looking for placement  Has the patient had a psychiatric consult: Yes.   Survey of unit performed for contraband, proper placement and condition of furniture, tampering with fixtures in bathroom, shower, and each patient room: Yes.  ; Findings:  APPEARANCE/BEHAVIOR Calm and cooperative NEURO ASSESSMENT Orientation: oriented to self and place  Denies pain Hallucinations: No.None noted (Hallucinations) denies  Speech: Normal Gait: normal RESPIRATORY ASSESSMENT Even  Unlabored respirations  CARDIOVASCULAR ASSESSMENT Pulses equal   regular rate  Skin warm and dry   GASTROINTESTINAL ASSESSMENT no GI complaint EXTREMITIES Full ROM  PLAN OF CARE Provide calm/safe environment. Vital signs assessed twice daily. ED BHU Assessment once each 12-hour shift. Collaborate with TTS daily or as condition indicates. Assure the ED provider has rounded once each shift. Provide and encourage hygiene. Provide redirection as needed. Assess for escalating behavior; address immediately and inform ED provider.  Assess family dynamic and appropriateness for visitation as needed: Yes.  ; If necessary, describe findings:  Educate the patient/family about BHU procedures/visitation: Yes.  ; If necessary, describe findings:

## 2019-08-13 NOTE — ED Notes (Signed)
BEHAVIORAL HEALTH ROUNDING Patient sleeping: No. Patient alert and oriented: yes Behavior appropriate: Yes.  ; If no, describe:  Nutrition and fluids offered: yes Toileting and hygiene offered: Yes  Sitter present: q15 minute observations  Law enforcement present: Yes [employer]  

## 2019-08-14 DIAGNOSIS — F209 Schizophrenia, unspecified: Secondary | ICD-10-CM | POA: Diagnosis not present

## 2019-08-14 NOTE — ED Notes (Signed)
Pt stated he wanted a shower last night to the tech on duty. Was told he could have one in the morning was asked this morning did he still want a shower, and he refused because he was upset about his breakfast not been here. I asked him if he would like some cracker's and peanut butter he said no that he wanted too go home.

## 2019-08-14 NOTE — ED Provider Notes (Signed)
-----------------------------------------   6:51 AM on 08/14/2019 -----------------------------------------   Blood pressure 95/66, pulse 67, temperature 98.3 F (36.8 C), temperature source Oral, resp. rate 18, height 5\' 11"  (1.803 m), weight 72.6 kg, SpO2 97 %.  The patient is sleeping at this time.  There have been no acute events since the last update.  Awaiting disposition plan from Behavioral Medicine and/or Social Work team(s).   Paulette Blanch, MD 08/14/19 [phone]

## 2019-08-14 NOTE — ED Notes (Signed)
Pt. Encouraged to drink all water with medication this morning, pt. Managed to drink about half 4 oz.

## 2019-08-15 DIAGNOSIS — F209 Schizophrenia, unspecified: Secondary | ICD-10-CM | POA: Diagnosis not present

## 2019-08-15 NOTE — ED Notes (Signed)
Hourly rounding reveals patient in room asleep, eyes closed with equal unlabored RR. No complaints, stable, in no acute distress. Q15 minute rounds and monitoring via Security Cameras to continue.  

## 2019-08-15 NOTE — ED Provider Notes (Signed)
-----------------------------------------   5:30 AM on 08/15/2019 -----------------------------------------   BP 90/66 (BP Location: Right Arm)   Pulse 67   Temp 97.9 F (36.6 C) (Oral)   Resp 18   Ht 5\' 11"  (1.803 m)   Wt 72.6 kg   SpO2 95%   BMI 22.32 kg/m   No acute events overnight.  Disposition is pending per Psychiatry/Behavioral Medicine team recommendations.     Nance Pear, MD 08/15/19 [phone]

## 2019-08-15 NOTE — ED Notes (Signed)
Gave patient a shower and assisted nurse with placement of condom cath

## 2019-08-15 NOTE — ED Notes (Signed)
BEHAVIORAL HEALTH ROUNDING Patient sleeping: Yes.   Patient alert and oriented: eyes closed  Appears asleep Behavior appropriate: Yes.  ; If no, describe:  Nutrition and fluids offered: Yes  Toileting and hygiene offered: sleeping Sitter present: q 15 minute observations  Law enforcement present: yes  [employer] 

## 2019-08-15 NOTE — ED Notes (Addendum)
BEHAVIORAL HEALTH ROUNDING Patient sleeping: Yes.   Patient alert and oriented: eyes closed  Appears asleep Behavior appropriate: Yes.  ; If no, describe:  Nutrition and fluids offered: Yes  Toileting and hygiene offered: sleeping Sitter present: q 15 minute observations  Law enforcement present: yes  [employer]   ENVIRONMENTAL ASSESSMENT Potentially harmful objects out of patient reach: Yes.   Personal belongings secured: Yes.   Patient dressed in hospital provided attire only: Yes.   Plastic bags out of patient reach: Yes.   Patient care equipment (cords, cables, call bells, lines, and drains) shortened, removed, or accounted for: Yes.   Equipment and supplies removed from bottom of stretcher: Yes.   Potentially toxic materials out of patient reach: Yes.   Sharps container removed or out of patient reach: Yes.   

## 2019-08-15 NOTE — ED Notes (Signed)
Patient observed lying in bed with eyes closed  Even, unlabored respirations observed   NAD pt appears to be sleeping  I will continue to monitor along with every 15 minute visual observations     

## 2019-08-15 NOTE — ED Notes (Signed)
Pt soiled bedding and self. Pt in need of shower. Tech assisted with task. Pts bedding and scrubs changed. Room cleaned. Pt placed back in to bed and condom catheter in place for the night. Will remove before shift change.

## 2019-08-15 NOTE — ED Notes (Signed)
Report to include Situation, Background, Assessment, and Recommendations received from Amy RN. Patient alert and oriented, skin warm and dry, & pt in no acute distress. Patient denies SI, HI, AVH and pain. Patient made aware of Q15 minute rounds and Engineer, drilling presence for their safety. Patient instructed to come to me with needs or concerns.

## 2019-08-15 NOTE — ED Notes (Signed)
BEHAVIORAL HEALTH ROUNDING Patient sleeping: Yes.   Patient alert and oriented: eyes closed  Appears asleep Behavior appropriate: Yes.  ; If no, describe:  Nutrition and fluids offered: Yes  Toileting and hygiene offered: sleeping Sitter present: q 15 minute observations  Law enforcement present  [employer]

## 2019-08-16 DIAGNOSIS — F209 Schizophrenia, unspecified: Secondary | ICD-10-CM | POA: Diagnosis not present

## 2019-08-16 NOTE — ED Notes (Signed)
VOL/Pending Placement 

## 2019-08-16 NOTE — ED Provider Notes (Signed)
-----------------------------------------   4:46 AM on 08/16/2019 -----------------------------------------   Blood pressure 108/69, pulse (!) 53, temperature 98.4 F (36.9 C), temperature source Oral, resp. rate 18, height 1.803 m (5\' 11" ), weight 72.6 kg, SpO2 100 %.  The patient is calm and cooperative at this time.  There have been no acute events since the last update.  Awaiting disposition plan from Behavioral Medicine and/or Social Work team(s).   Hinda Kehr, MD 08/16/19 [phone]

## 2019-08-16 NOTE — ED Notes (Signed)
Hourly rounding reveals patient in room asleep, eyes closed with equal unlabored RR. No complaints, stable, in no acute distress. Q15 minute rounds and monitoring via Security Cameras to continue.  

## 2019-08-16 NOTE — ED Notes (Signed)
Pt was checked for incontinence. No output at this time. Pt was given a beverage per request

## 2019-08-16 NOTE — ED Notes (Addendum)
Hourly rounding reveals patient in room asleep, eyes closed with equal unlabored RR. No complaints, stable, in no acute distress. Q15 minute rounds and monitoring via Security Cameras to continue.  

## 2019-08-16 NOTE — ED Notes (Signed)
Pt refused meal tray °

## 2019-08-17 DIAGNOSIS — F209 Schizophrenia, unspecified: Secondary | ICD-10-CM | POA: Diagnosis not present

## 2019-08-17 NOTE — ED Notes (Signed)
Assumed care of patient, patient laying in bed awake, denies pain/sob or discomforts. Patient incontinent of bladders last night as per prior nurse. Peri care performed and clean clothes and linen provided. Will monitor through the day. Presently awaitin breakfast. Safety maintained.

## 2019-08-17 NOTE — ED Notes (Signed)
Patient sleeping reused his ;unch tray.

## 2019-08-17 NOTE — ED Notes (Signed)
Report to include situation, background, assessment and recommendations from [facility]. Patient sleeping, respirations regular and unlabored. Q15 minute rounds and Engineer, drilling observation to continue.

## 2019-08-17 NOTE — ED Notes (Signed)
Hourly rounding reveals patient sleeping in room. No complaints, stable, in no acute distress. Q15 minute rounds and monitoring via Rover and Officer to continue.  

## 2019-08-17 NOTE — ED Notes (Signed)
Pt not wanting to get up during the night to use the bathroom, pt checked for incontinence at this time, pt found to have soaked through chux and onto bedsheet.  Pt provided full linen change, provided new scrubs and cleansed himself with no-rinse wipe.  Informed patient he will be needing to shower after breakfast, pt stated "ok"

## 2019-08-17 NOTE — ED Notes (Signed)
Sleeping a lot today, refused his lunch, ate 60% of his breakfast. Patient encouraged Q2 hours to get out of bed and use the bathroom.

## 2019-08-17 NOTE — ED Notes (Signed)
Hourly rounding reveals patient in room. No complaints, stable, in no acute distress. Q15 minute rounds and monitoring via Rover and Officer to continue.   

## 2019-08-17 NOTE — Social Work (Addendum)
CSW waiting for EDP to sign updated FL2

## 2019-08-17 NOTE — NC FL2 (Signed)
[place] LEVEL OF CARE SCREENING TOOL     IDENTIFICATION  Patient Name: Roberto Vasquez Birthdate: 12-10-1960 Sex: male Admission Date (Current Location): 07/13/2019  [place] and [place] Number:  Selena Lesser [id number] [place] and Address:  [facility], [address] [place], [place] [address]      Provider Number: [id number]  Attending Physician Name and Address:  No att. providers found  Relative Name and Phone Number:  DSS Legal Guardian Kalman Drape, office : [phone]    Current Level of Care: Hospital Recommended Level of Care: [place] Prior Approval Number:    Date Approved/Denied:   PASRR Number:    Discharge Plan: Other (Comment)(ALF)    Current Diagnoses: Patient Active Problem List   Diagnosis Date Noted  . UTI (urinary tract infection) 05/14/2018  . Seizure ([place]) 04/06/2018  . Tobacco use disorder 02/23/2017  . Schizophrenia ([place]) 02/20/2017  . Noncompliance 02/20/2017  . HTN (hypertension) 03/01/2015    Orientation RESPIRATION BLADDER Height & Weight     Self, Situation, Time, Place  Normal Incontinent Weight: 160 lb (72.6 kg) Height:  5\' 11"  (180.3 cm)  BEHAVIORAL SYMPTOMS/MOOD NEUROLOGICAL BOWEL NUTRITION STATUS  Other (Comment)(diagnosed with schizophrenia)   Incontinent Diet  AMBULATORY STATUS COMMUNICATION OF NEEDS Skin   Independent Verbally Normal                       Personal Care Assistance Level of Assistance  Bathing, Feeding, Dressing Bathing Assistance: Limited assistance Feeding assistance: Independent Dressing Assistance: Limited assistance     Functional Limitations Info  Sight, Hearing, Speech Sight Info: Adequate Hearing Info: Adequate Speech Info: Adequate    SPECIAL CARE FACTORS FREQUENCY                       Contractures Contractures Info: Not present    Additional Factors Info  Code Status, Allergies Code Status Info:  FULL Allergies Info: No Known Allergies           Current Medications (08/17/2019):  This is the current hospital active medication list Current Facility-Administered Medications  Medication Dose Route Frequency Provider Last Rate Last Dose  . hydrochlorothiazide (MICROZIDE) capsule 12.5 mg  12.5 mg Oral Daily Vanessa [place], MD   12.5 mg at 08/14/19 1009  . QUEtiapine (SEROQUEL) tablet 200 mg  200 mg Oral QHS Cristofano, Dorene Ar, MD   200 mg at 08/16/19 2140  . traZODone (DESYREL) tablet 100 mg  100 mg Oral QHS Cristofano, Dorene Ar, MD   100 mg at 08/16/19 2140   Current Outpatient Medications  Medication Sig Dispense Refill  . amLODipine (NORVASC) 10 MG tablet Take 10 mg by mouth daily.    . ARIPiprazole ER (ABILIFY MAINTENA) 300 MG PRSY prefilled syringe Inject 300 mg into the muscle every 28 (twenty-eight) days.    . fesoterodine (TOVIAZ) 4 MG TB24 tablet Take 1 tablet (4 mg total) by mouth daily. 90 tablet 3  . hydrochlorothiazide (MICROZIDE) 12.5 MG capsule Take 12.5 mg by mouth daily.    Marland Kitchen lisinopril (PRINIVIL,ZESTRIL) 20 MG tablet Take 20 mg by mouth every morning.    Marland Kitchen QUEtiapine (SEROQUEL) 200 MG tablet Take 1 tablet (200 mg total) by mouth at bedtime. 15 tablet 0  . traZODone (DESYREL) 100 MG tablet Take 1 tablet (100 mg total) by mouth at bedtime. 30 tablet 0  . cephALEXin (KEFLEX) 500 MG capsule Take 1 capsule (500 mg total) by  mouth 3 (three) times daily. (Patient not taking: Reported on 10/26/2018) 12 capsule 0  . POTASSIUM CHLORIDE PO Take by mouth.       Discharge Medications: Please see discharge summary for a list of discharge medications.  Relevant Imaging Results:  Relevant Lab Results:   Additional Information SSN:    [id number]  Tania Shone Leventhal, LCSW

## 2019-08-17 NOTE — ED Notes (Signed)
Dinner tray provided

## 2019-08-17 NOTE — ED Provider Notes (Signed)
-----------------------------------------   7:11 AM on 08/17/2019 -----------------------------------------   Blood pressure 95/75, pulse (!) 56, temperature 98.3 F (36.8 C), temperature source Oral, resp. rate 16, height 5\' 11"  (1.803 m), weight 72.6 kg, SpO2 100 %.  The patient had no acute events since last update.  Calm and cooperative at this time.  Disposition is pending per Psychiatry/Behavioral Medicine team recommendations.     Rudene Re, MD 08/17/19 [phone]

## 2019-08-18 DIAGNOSIS — F209 Schizophrenia, unspecified: Secondary | ICD-10-CM | POA: Diagnosis not present

## 2019-08-18 NOTE — ED Notes (Signed)
Patient walked to the bathroom, due to having urinated on himself, patient wash washed off and then continued to stay in the bathroom and sit on the toilet. Staff asked patient if he was ok and he said yes, staff informed him that he can go back to bed and he got up and went to his room

## 2019-08-18 NOTE — ED Notes (Signed)
Hourly rounding reveals patient sleeping in room. No complaints, stable, in no acute distress. Q15 minute rounds and monitoring via Security to continue. 

## 2019-08-18 NOTE — ED Notes (Signed)
This tech checked with pt and offered the restroom. Pt's brief is dry.

## 2019-08-18 NOTE — ED Notes (Signed)
Hourly rounding reveals patient sleeping in room. No complaints, stable, in no acute distress. Q15 minute rounds and monitoring via Rover and Officer to continue.  

## 2019-08-18 NOTE — ED Notes (Signed)
Discharge/Departure condition entered in error.

## 2019-08-18 NOTE — ED Provider Notes (Signed)
-----------------------------------------   6:43 AM on 08/18/2019 -----------------------------------------   Blood pressure 101/78, pulse 60, temperature 98.6 F (37 C), temperature source Oral, resp. rate 16, height 5\' 11"  (1.803 m), weight 72.6 kg, SpO2 100 %.  The patient is sleeping at this time.  There have been no acute events since the last update.  Awaiting disposition plan from Behavioral Medicine and/or Social Work team(s).   Paulette Blanch, MD 08/18/19 [phone]

## 2019-08-18 NOTE — ED Notes (Signed)
Pt asleep, meal tray placed in rm.  

## 2019-08-19 DIAGNOSIS — F209 Schizophrenia, unspecified: Secondary | ICD-10-CM | POA: Diagnosis not present

## 2019-08-19 NOTE — ED Notes (Signed)
PATIENT LAYING IN BED WATCHING TV COMFORTABLE, BATHROOM RUN OFFERED PATIENT DECLINED. BEVERAGE OFFERED PATIENT DECLINED.

## 2019-08-19 NOTE — ED Notes (Signed)
Pt resting with his eyes closed, blanket over his head, lights turned down, no distress noted, cont to monitor

## 2019-08-19 NOTE — ED Notes (Signed)
Assumed care of patient patient in bed with covers over his head,Easily responds to name, patient asked if he needed to go to bathroom, patient declined, vss this morning took morning meds administered by prior nurse. Safety maintained. Will monitor.

## 2019-08-19 NOTE — ED Notes (Signed)
Patient 60% of his lunch, requesting to take shower. Shower provided, clean line placed on bed while patient showered and changed into clean clothes.

## 2019-08-19 NOTE — ED Provider Notes (Signed)
-----------------------------------------   5:23 AM on 08/19/2019 -----------------------------------------   Blood pressure 112/84, pulse 71, temperature (!) 97.4 F (36.3 C), temperature source Oral, resp. rate 18, height 5\' 11"  (1.803 m), weight 72.6 kg, SpO2 100 %.  The patient had no acute events since last update.  Calm and cooperative at this time.  Disposition is pending per Psychiatry/Behavioral Medicine team recommendations.     Rudene Re, MD 08/19/19 [phone]

## 2019-08-19 NOTE — ED Notes (Signed)
Lunch tray received and provided to patient

## 2019-08-19 NOTE — ED Notes (Signed)
DINNER TRAY PROVIDED

## 2019-08-19 NOTE — ED Notes (Signed)
Pt resting with his eyes closed, no distress noted cont to monitor 

## 2019-08-19 NOTE — ED Notes (Signed)
Pt ate all his breakfast and was encouraged to get up and use the bathroom, pt states the last time he used the bathroom was last pm and that he doesn't need to go now, pt encouraged to try due to eating breakfast and having juice. Pt went to the bathroom, toilet heard flushing twice, pt states that he did use the bathroom, however at my entrance to the bathroom pt's pants were up

## 2019-08-20 DIAGNOSIS — F209 Schizophrenia, unspecified: Secondary | ICD-10-CM | POA: Diagnosis not present

## 2019-08-20 NOTE — ED Notes (Addendum)
Pt woke to check toilet ability, pt ambulatory to toilet for urination, pt diaper check out dry, pt returned to bed, fluids and warm blanket given

## 2019-08-20 NOTE — ED Notes (Signed)
Meal tray given 

## 2019-08-20 NOTE — ED Provider Notes (Signed)
-----------------------------------------   5:15 AM on 08/20/2019 -----------------------------------------   Blood pressure 118/64, pulse 68, temperature 98.6 F (37 C), temperature source Oral, resp. rate 17, height 5\' 11"  (1.803 m), weight 72.6 kg, SpO2 99 %.  The patient had no acute events since last update.  Calm and cooperative at this time.  Disposition is pending per Psychiatry/Behavioral Medicine team recommendations.     Alfred Levins, [place], MD 08/20/19 [phone]

## 2019-08-20 NOTE — ED Notes (Signed)
Pt. Currently sleeping in bed in room #23.

## 2019-08-20 NOTE — ED Notes (Signed)
Pt cleansed of urine and stool. New brief, cleaned with warm wipes, new pants, new shirt, new socks, and new sheets. Pt able to stand and assist with these changes. Pt eating some of lunch tray now.

## 2019-08-21 DIAGNOSIS — F209 Schizophrenia, unspecified: Secondary | ICD-10-CM | POA: Diagnosis not present

## 2019-08-21 NOTE — ED Notes (Signed)
Hourly rounding reveals patient in room. No complaints, stable, in no acute distress. Q15 minute rounds and monitoring to continue. 

## 2019-08-21 NOTE — ED Notes (Signed)
Pt. Woke up and sitting on bed.  Pt. States "I'm hungry"  Do to patient not eating or drinking much yesterday, pt given meal tray and drink.

## 2019-08-21 NOTE — ED Notes (Signed)
This RN attempted to give pt medication as ordered. Pt refusing and told this RN to leave the room.

## 2019-08-21 NOTE — ED Notes (Signed)
Meal tray provided.

## 2019-08-21 NOTE — ED Notes (Signed)
Report to include Situation, Background, Assessment, and Recommendations received from Amy B. RN. Patient alert and oriented, warm and dry, in no acute distress. Patient denies SI, HI, AVH and pain. Patient made aware of Q15 minute rounds and Rover and Officer presence for their safety. Patient instructed to come to me with needs or concerns.  

## 2019-08-21 NOTE — ED Notes (Signed)
Hourly rounding reveals patient sleeping in room. No complaints, stable, in no acute distress. Q15 minute rounds and monitoring via Rover and Officer to continue.  

## 2019-08-21 NOTE — ED Notes (Signed)
Meal tray left in patent's room. Pt stated he was not hungry.

## 2019-08-22 DIAGNOSIS — F209 Schizophrenia, unspecified: Secondary | ICD-10-CM | POA: Diagnosis not present

## 2019-08-22 NOTE — ED Notes (Signed)
Pt states that he is dry right now and does not need to be changed.

## 2019-08-22 NOTE — ED Notes (Signed)
Hourly rounding reveals patient sleeping in room. No complaints, stable, in no acute distress. Q15 minute rounds and monitoring via Rover and Officer to continue.  

## 2019-08-22 NOTE — ED Notes (Signed)
Meal tray given 

## 2019-08-22 NOTE — ED Notes (Signed)
Laying in bed with cover over his head. Answers easily to name.

## 2019-08-22 NOTE — Social Work (Signed)
Claiborne Billings from [facility] asked for additional notes. They were faxed to: [phone].    Fredric Mare, LCSW  [facility] ED

## 2019-08-22 NOTE — Social Work (Signed)
Patient's guardian, Roberto Vasquez, contacted CSW to ask for updates. Rhesha shared that [facility] is looking over patient's paperwork. CSW shared that updated FL2 has been faxed out to ALFs. CSW faxed FL2 to legal guardian to continue to expand search.  Fax: [phone]   Fredric Mare, [place]  [facility] ED  [phone]

## 2019-08-22 NOTE — Social Work (Addendum)
Updated FL2  Faxed to:     [place] [facility] [place] [facility]    [facility] [place] [facility] [place] [facility]    [place] [facility]    [facility] [place] [facility] In [place] [facility] [place] [facility] [place] [facility]    [facility] [place] [facility] In [place] [facility] [place] [facility]    [place] [facility] [place] [facility]    [facility] [place] [facility] In [place]

## 2019-08-22 NOTE — ED Notes (Signed)
Hourly rounding reveals patient sleeping in room. No complaints, stable, in no acute distress. Q15 minute rounds and monitoring via Security Cameras to continue. 

## 2019-08-22 NOTE — ED Notes (Signed)
VOL/Pending Placement 

## 2019-08-23 DIAGNOSIS — F209 Schizophrenia, unspecified: Secondary | ICD-10-CM | POA: Diagnosis not present

## 2019-08-23 NOTE — ED Notes (Signed)
Pt provided breakfast tray at this time. 

## 2019-08-23 NOTE — ED Notes (Signed)
Patient refused his meds stated we trying to keep him drugged up. And he can not eat when he takes those medas.

## 2019-08-23 NOTE — ED Notes (Signed)
Hourly rounding reveals patient in room asleep, eyes closed with equal unlabored RR. No complaints, stable, in no acute distress. Q15 minute rounds and monitoring via Security Cameras to continue.  

## 2019-08-23 NOTE — ED Notes (Signed)
Hourly rounding reveals patient in room awake watching television. No complaints, stable, in no acute distress. Q15 minute rounds and monitoring via Security Cameras to continue.  

## 2019-08-23 NOTE — ED Notes (Signed)
Pt asleep, lunch tray placed in rm. 

## 2019-08-23 NOTE — ED Notes (Signed)
Assumed care of patient, patient out of bed today went to bathroom, picked up all his meal trays and put them in the trash. Sitting up in bed watching TV. Ate 80% of his breakfast. Refused shower this morning. Safety maintained will monitor.

## 2019-08-23 NOTE — ED Provider Notes (Signed)
-----------------------------------------   4:56 AM on 08/23/2019 -----------------------------------------   Blood pressure 107/72, pulse 60, temperature 98 F (36.7 C), temperature source Oral, resp. rate 17, height 1.803 m (5\' 11" ), weight 72.6 kg, SpO2 97 %.  The patient is calm and cooperative at this time.  There have been no acute events since the last update.  Awaiting disposition plan from Behavioral Medicine and/or Social Work team(s).   Hinda Kehr, MD 08/23/19 [phone]

## 2019-08-23 NOTE — ED Notes (Signed)
Pt was changed into new wine scrubs and linens were changed. Pt alert and oriented with no concerns for staff at this time

## 2019-08-23 NOTE — ED Notes (Signed)
Report to include Situation, Background, Assessment, and Recommendations received from RN Jeanette. Patient alert and oriented, warm and dry, in no acute distress. Patient denies SI, HI, AVH and pain. Patient made aware of Q15 minute rounds and Rover and Officer presence for their safety. Patient instructed to come to me with needs or concerns.  

## 2019-08-23 NOTE — ED Notes (Signed)
Pt ambulatory to bathroom

## 2019-08-23 NOTE — ED Notes (Signed)
Patient out of bed to hall requesting a remote control, patient pulled curtains to the door stating he does not like people looking into his room. Back in bed watching TV.

## 2019-08-23 NOTE — Social Work (Signed)
[place] declined patient due to "aggressive behaviors."  Patient's guardian made aware.    [place], [place] ED  [phone]

## 2019-08-23 NOTE — ED Notes (Signed)
Pt was offered a shower and declined.

## 2019-08-23 NOTE — ED Notes (Signed)
Pt provided dinner tray but does not want to eat. Tray sat on the sink.

## 2019-08-24 ENCOUNTER — Emergency Department: Payer: Medicare Other

## 2019-08-24 DIAGNOSIS — F209 Schizophrenia, unspecified: Secondary | ICD-10-CM | POA: Diagnosis not present

## 2019-08-24 NOTE — ED Notes (Signed)
BEHAVIORAL HEALTH ROUNDING Patient sleeping: No. Patient alert and oriented: yes Behavior appropriate: Yes.  ; If no, describe:  Nutrition and fluids offered: yes Toileting and hygiene offered: Yes  Sitter present: q15 minute observations Law enforcement present: Yes  ACSD  

## 2019-08-24 NOTE — ED Notes (Signed)
Hourly rounding reveals patient in room. No complaints, stable, in no acute distress. Q15 minute rounds and monitoring via Rover and Officer to continue.   

## 2019-08-24 NOTE — ED Notes (Signed)
He is awake - sitting up in bed  Lights are on  Introduced myself to him  He reports  "I feel good today"  Plan for the day discussed including meal times and taking a shower   Social work continues to look for him a safe place to live

## 2019-08-24 NOTE — Social Work (Addendum)
Patient's legal guardian, Roberto Vasquez shared that Roberto Vasquez [phone]) or a staff member from [facility] will be in the ED tomorrow (08/25/2019) to assess/interview patient and asked to make sure patient is cleaned.  Legal guardian also asked if TB test and COVID test can be ordered as well.  CSW will let EDP know.     [place], [place] ED  [phone]

## 2019-08-24 NOTE — ED Notes (Signed)
Report to include Situation, Background, Assessment, and Recommendations received from Amy RN. Patient alert and oriented, warm and dry, in no acute distress. Patient denies SI, HI, AVH and pain. Patient made aware of Q15 minute rounds and Rover and Officer presence for their safety. Patient instructed to come to me with needs or concerns.   

## 2019-08-24 NOTE — ED Notes (Signed)
BEHAVIORAL HEALTH ROUNDING Patient sleeping: Yes.   Patient alert and oriented: eyes closed  Appears asleep Behavior appropriate: Yes.  ; If no, describe:  Nutrition and fluids offered: Yes  Toileting and hygiene offered: sleeping Sitter present: q 15 minute observations  Law enforcement present: yes  ACSD 

## 2019-08-24 NOTE — ED Notes (Signed)
BEHAVIORAL HEALTH ROUNDING Patient sleeping: No. Patient alert and oriented: yes Behavior appropriate: Yes.  ; If no, describe:  Nutrition and fluids offered: yes Toileting and hygiene offered: Yes  Sitter present: q15 minute observations  Law enforcement present: Yes  [employer]   ENVIRONMENTAL ASSESSMENT Potentially harmful objects out of patient reach: Yes.   Personal belongings secured: Yes.   Patient dressed in hospital provided attire only: Yes.   Plastic bags out of patient reach: Yes.   Patient care equipment (cords, cables, call bells, lines, and drains) shortened, removed, or accounted for: Yes.   Equipment and supplies removed from bottom of stretcher: Yes.   Potentially toxic materials out of patient reach: Yes.   Sharps container removed or out of patient reach: Yes.   

## 2019-08-24 NOTE — ED Notes (Signed)
BEHAVIORAL HEALTH ROUNDING Patient sleeping: No. Patient alert and oriented: yes Behavior appropriate: Yes.  ; If no, describe:  Nutrition and fluids offered: yes Toileting and hygiene offered: Yes  Sitter present: q15 minute observations  Law enforcement present: Yes  [employer]  Pt awakened by radiology staff for CXR

## 2019-08-24 NOTE — ED Notes (Signed)
BEHAVIORAL HEALTH ROUNDING Patient sleeping: Yes.   Patient alert and oriented: eyes closed  Appears asleep Behavior appropriate: Yes.  ; If no, describe:  Nutrition and fluids offered: Yes  Toileting and hygiene offered: sleeping Sitter present: q 15 minute observations  Law enforcement present: yes  [employer] 

## 2019-08-24 NOTE — ED Notes (Signed)
Tubes requested from lab

## 2019-08-24 NOTE — ED Notes (Signed)
ED  Is the patient under IVC or is there intent for IVC:  voluntary  Is the patient medically cleared: Yes.   Is there vacancy in the ED BHU: Yes.   Is the population mix appropriate for patient: Yes.   Is the patient awaiting placement in inpatient or outpatient setting: Yes.  Social work is finding him a safe place to live   Has the patient had a psychiatric consult: Yes.   Survey of unit performed for contraband, proper placement and condition of furniture, tampering with fixtures in bathroom, shower, and each patient room: Yes.  ; Findings:  APPEARANCE/BEHAVIOR Calm and cooperative NEURO ASSESSMENT Orientation: oriented x3  Denies pain Hallucinations: No.None noted (Hallucinations) denies  Speech: Normal Gait: normal RESPIRATORY ASSESSMENT Even  Unlabored respirations  CARDIOVASCULAR ASSESSMENT Pulses equal   regular rate  Skin warm and dry   GASTROINTESTINAL ASSESSMENT no GI complaint EXTREMITIES Full ROM  PLAN OF CARE Provide calm/safe environment. Vital signs assessed twice daily. ED BHU Assessment once each 12-hour shift. Assure the ED provider has rounded once each shift. Provide and encourage hygiene. Provide redirection as needed. Assess for escalating behavior; address immediately and inform ED provider.  Assess family dynamic and appropriateness for visitation as needed: Yes.  ; If necessary, describe findings:  Educate the patient/family about BHU procedures/visitation: Yes.  ; If necessary, describe findings:

## 2019-08-24 NOTE — Social Work (Signed)
CSW faxed FL2 to Chrystie Nose at [facility]  [phone].    Tania Kalya Troeger, LCSW

## 2019-08-24 NOTE — ED Provider Notes (Signed)
-----------------------------------------   6:03 AM on 08/24/2019 -----------------------------------------   Blood pressure (!) 151/90, pulse 63, temperature 98.4 F (36.9 C), temperature source Oral, resp. rate 17, height 5\' 11"  (1.803 m), weight 72.6 kg, SpO2 100 %.  The patient is sleeping at this time.  There have been no acute events since the last update.  Awaiting disposition plan from Behavioral Medicine and/or Social Work team(s).   Paulette Blanch, MD 08/24/19 [phone]

## 2019-08-25 DIAGNOSIS — F209 Schizophrenia, unspecified: Secondary | ICD-10-CM | POA: Diagnosis not present

## 2019-08-25 NOTE — ED Notes (Signed)
Offered pt dinner tray and refused set aside for later.

## 2019-08-25 NOTE — ED Notes (Signed)
Hourly rounding reveals patient in room. No complaints, stable, in no acute distress. Q15 minute rounds and monitoring via Rover and Officer to continue.   

## 2019-08-25 NOTE — ED Provider Notes (Signed)
-----------------------------------------   5:58 AM on 08/25/2019 -----------------------------------------   BP (!) 151/90 (BP Location: Right Arm)   Pulse 63   Temp 98.4 F (36.9 C) (Oral)   Resp 17   Ht 5\' 11"  (1.803 m)   Wt 72.6 kg   SpO2 100%   BMI 22.32 kg/m   No acute events since last update.  Disposition is pending per Psychiatry/Behavioral Medicine team recommendations.     Nance Pear, MD 08/25/19 [phone]

## 2019-08-25 NOTE — ED Notes (Signed)
VOL/Pending Placement by CSW 

## 2019-08-25 NOTE — Social Work (Signed)
"  [facility] group home representatve (Marlette) came to interview and assess patient today. Marlette will have to report back to group home owner before given a decision. CSW provided her with phone number for RN case manager, Andreas Newport, if she would need to report back after today.  CSW also shared that TB test has been taken, as well as Xray.  CSW notified EDP to order COVID test due to last one being resulted on 10/24.     [place], [place]  [phone]

## 2019-08-26 DIAGNOSIS — F209 Schizophrenia, unspecified: Secondary | ICD-10-CM | POA: Diagnosis not present

## 2019-08-26 LAB — QUANTIFERON-TB GOLD PLUS (RQFGPL)
QuantiFERON Mitogen Value: >10 IU/mL
QuantiFERON Nil Value: 0.01 IU/mL
QuantiFERON TB1 Ag Value: 0.02 IU/mL
QuantiFERON TB2 Ag Value: 0.02 IU/mL

## 2019-08-26 LAB — SARS CORONAVIRUS 2 (TAT 6-24 HRS): SARS Coronavirus 2: NEGATIVE

## 2019-08-26 LAB — QUANTIFERON-TB GOLD PLUS: QuantiFERON-TB Gold Plus: NEGATIVE

## 2019-08-26 NOTE — ED Notes (Signed)
BEHAVIORAL HEALTH ROUNDING Patient sleeping: Yes.   Patient alert and oriented: eyes closed  Appears asleep Behavior appropriate: Yes.  ; If no, describe:  Nutrition and fluids offered: Yes  Toileting and hygiene offered: sleeping Sitter present: q 15 minute observations  Law enforcement present: yes  [employer] 

## 2019-08-26 NOTE — ED Notes (Signed)
ED Is the patient under IVC or is there intent for IVC:  no  Is the patient medically cleared: Yes.   Is there vacancy in the ED BHU: Yes.   Is the population mix appropriate for patient:  Is the patient awaiting placement in inpatient or outpatient setting: Yes.   Social work is working on safe placement  Has the patient had a psychiatric consult: Yes.   Survey of unit performed for contraband, proper placement and condition of furniture, tampering with fixtures in bathroom, shower, and each patient room: Yes.  ; Findings:  APPEARANCE/BEHAVIOR Calm and cooperative NEURO ASSESSMENT Orientation: oriented x3  Denies pain Hallucinations: No.None noted (Hallucinations) denies  Speech: Normal Gait: normal RESPIRATORY ASSESSMENT Even  Unlabored respirations  CARDIOVASCULAR ASSESSMENT Pulses equal   regular rate  Skin warm and dry   GASTROINTESTINAL ASSESSMENT no GI complaint EXTREMITIES Full ROM  PLAN OF CARE Provide calm/safe environment. Vital signs assessed twice daily. ED BHU Assessment once each 12-hour shift. Collaborate with TTS daily or as condition indicates. Assure the ED provider has rounded once each shift. Provide and encourage hygiene. Provide redirection as needed. Assess for escalating behavior; address immediately and inform ED provider.  Assess family dynamic and appropriateness for visitation as needed: Yes.  ; If necessary, describe findings:  Educate the patient/family about BHU procedures/visitation: Yes.  ; If necessary, describe findings:

## 2019-08-26 NOTE — ED Notes (Signed)
Pt sleeping at this time.

## 2019-08-26 NOTE — ED Provider Notes (Signed)
7:30 AM 12/4  Blood pressure 112/86, pulse 95, temperature 98.4 F (36.9 C), temperature source Oral, resp. rate 18, height 5\' 11"  (1.803 m), weight 72.6 kg, SpO2 100 %.    There have been no acute events since the last update.  Awaiting disposition plan from Behavioral Medicine team.   Vanessa Shubuta, MD 08/26/19 0730

## 2019-08-26 NOTE — ED Notes (Signed)
BEHAVIORAL HEALTH ROUNDING Patient sleeping: No. Patient alert and oriented: yes Behavior appropriate: Yes.  ; If no, describe:  Nutrition and fluids offered: yes Toileting and hygiene offered: Yes  Sitter present: q15 minute observations Law enforcement present: Yes  ACSD  

## 2019-08-26 NOTE — ED Notes (Signed)
Patient observed lying in bed with eyes closed  Even, unlabored respirations observed   NAD pt appears to be sleeping  I will continue to monitor along with every 15 minute visual observations     

## 2019-08-26 NOTE — ED Notes (Signed)
BEHAVIORAL HEALTH ROUNDING Patient sleeping: No. Patient alert and oriented: yes Behavior appropriate: Yes.  ; If no, describe:  Nutrition and fluids offered: yes Toileting and hygiene offered: Yes  Sitter present: q15 minute observations  Law enforcement present: Yes  [employer]  ENVIRONMENTAL ASSESSMENT Potentially harmful objects out of patient reach: Yes.   Personal belongings secured: Yes.   Patient dressed in hospital provided attire only: Yes.   Plastic bags out of patient reach: Yes.   Patient care equipment (cords, cables, call bells, lines, and drains) shortened, removed, or accounted for: Yes.   Equipment and supplies removed from bottom of stretcher: Yes.   Potentially toxic materials out of patient reach: Yes.   Sharps container removed or out of patient reach: Yes.    

## 2019-08-26 NOTE — ED Notes (Signed)
BEHAVIORAL HEALTH ROUNDING Patient sleeping: No. Patient alert and oriented: yes Behavior appropriate: Yes.  ; If no, describe:  Nutrition and fluids offered: yes Toileting and hygiene offered: Yes  Sitter present: q15 minute observations  Law enforcement present: Yes [employer]  

## 2019-08-26 NOTE — ED Notes (Signed)
Pt was given breakfast meal tray and apple juice.

## 2019-08-26 NOTE — ED Notes (Signed)
Pt up and assisted to bathroom by kayla, EDT.

## 2019-08-27 DIAGNOSIS — F209 Schizophrenia, unspecified: Secondary | ICD-10-CM | POA: Diagnosis not present

## 2019-08-27 NOTE — ED Provider Notes (Signed)
-----------------------------------------   3:29 AM on 08/27/2019 -----------------------------------------   Blood pressure 103/74, pulse 97, temperature 98.2 F (36.8 C), temperature source Oral, resp. rate 18, height 5\' 11"  (1.803 m), weight 72.6 kg, SpO2 100 %.  The patient had no acute events since last update.  Calm and cooperative at this time.  Disposition is pending per Psychiatry/Behavioral Medicine team recommendations.     Rudene Re, MD 08/27/19 [phone]

## 2019-08-27 NOTE — ED Notes (Signed)
Pt given meal tray by Nicki Reaper, EDT

## 2019-08-27 NOTE — ED Notes (Signed)
Pt given meal tray.

## 2019-08-27 NOTE — ED Notes (Signed)
Pt. In hallway bed resting.  Pt. Asked if he wanted anything to drink.  Pt. States "I am ok". Pt. Has no concerns or questions at this time.

## 2019-08-28 NOTE — ED Notes (Signed)
Meal tray given 

## 2019-08-28 NOTE — ED Notes (Signed)
Pt ambulated to restroom without difficulty

## 2019-08-28 NOTE — ED Notes (Signed)
Pt walked to bathroom without assistance. Pt voided x1.

## 2019-08-28 NOTE — ED Notes (Signed)
Pt given meal tray.

## 2019-08-28 NOTE — ED Notes (Signed)
Meal tray provided. Pt eating without difficulty.

## 2019-08-28 NOTE — ED Provider Notes (Signed)
-----------------------------------------   3:19 AM on 08/28/2019 -----------------------------------------   Blood pressure 106/70, pulse (!) 116, temperature 98 F (36.7 C), temperature source Oral, resp. rate 16, height 1.803 m (5\' 11" ), weight 72.6 kg, SpO2 99 %.  The patient is calm and cooperative at this time.  The patient is now in his 46th day in the emergency department.  Awaiting disposition plan.   Hinda Kehr, MD 08/28/19 [phone]

## 2019-08-28 NOTE — ED Notes (Signed)
Pt. Assisted to bathroom to change under garments.  Pt. Helped into new set of scrubs.  Pt. Able to ambulate unassisted to 20 hallway bed.

## 2019-08-29 DIAGNOSIS — F209 Schizophrenia, unspecified: Secondary | ICD-10-CM | POA: Diagnosis not present

## 2019-08-29 NOTE — TOC Progression Note (Signed)
Transition of Care [facility]) - Progression Note    Patient Details  Name: Roberto Vasquez MRN: [id number] Date of Birth: September 10, 1961  Transition of Care [facility]) CM/SW [place], [place] Phone Number: 08/29/2019, 3:09 PM  Clinical Narrative:     CSW called America Brown [phone]) at [facility]. Tammy stated that they declined the patient. She stated that he was not a good fit with the roommate he would have to be with. Tammy stated that she knows a place that has openings that she sent CSW/CM info to. Should receive a call soon.        Expected Discharge Plan and Services                                                 Social Determinants of Health (SDOH) Interventions    Readmission Risk Interventions No flowsheet data found.

## 2019-08-29 NOTE — ED Provider Notes (Signed)
-----------------------------------------   4:15 AM on 08/29/2019 -----------------------------------------   Blood pressure 108/80, pulse 66, temperature 98.4 F (36.9 C), temperature source Oral, resp. rate 16, height 1.803 m (5\' 11" ), weight 72.6 kg, SpO2 96 %.  DAY 47: The patient is calm and cooperative at this time.  There have been no acute events since the last update.  Awaiting disposition plan from Behavioral Medicine and/or Social Work team(s).   Hinda Kehr, MD 08/29/19 [phone]

## 2019-08-29 NOTE — ED Notes (Signed)
Hourly rounding reveals patient sleeping in room. No complaints, stable, in no acute distress. Q15 minute rounds and monitoring via Security to continue. 

## 2019-08-30 DIAGNOSIS — F209 Schizophrenia, unspecified: Secondary | ICD-10-CM | POA: Diagnosis not present

## 2019-08-30 NOTE — ED Notes (Signed)
BEHAVIORAL HEALTH ROUNDING Patient sleeping: No. Patient alert : yes Behavior appropriate: Yes.  ; If no, describe:  Nutrition and fluids offered: yes Toileting and hygiene offered: Yes  Sitter present: q15 minute observations Law enforcement present: Yes   [employer]  

## 2019-08-30 NOTE — ED Provider Notes (Signed)
-----------------------------------------   6:04 AM on 08/30/2019 -----------------------------------------   Blood pressure (!) 135/105, pulse 83, temperature 98.4 F (36.9 C), temperature source Oral, resp. rate 17, height 5\' 11"  (1.803 m), weight 72.6 kg, SpO2 98 %.  The patient is sleeping at this time.  There have been no acute events since the last update.  Awaiting disposition plan from Behavioral Medicine and/or Social Work team(s).   Paulette Blanch, MD 08/30/19 [phone]

## 2019-08-30 NOTE — ED Notes (Signed)
BEHAVIORAL HEALTH ROUNDING Patient sleeping: No. Patient alert and oriented: yes Behavior appropriate: Yes.  ; If no, describe:  Nutrition and fluids offered: yes Toileting and hygiene offered: Yes  Sitter present: q15 minute observations  Law enforcement present: Yes [employer]  

## 2019-08-30 NOTE — ED Notes (Signed)
ED Is the patient under IVC or is there intent for IVC:  Voluntary    Is the patient medically cleared: Yes.   Is there vacancy in the ED BHU: Yes.   Is the population mix appropriate for patient: Yes.   Is the patient awaiting placement in inpatient or outpatient setting: Yes.  He is awaiting placement   Social work consult is in progress  Has the patient had a psychiatric consult: Yes.   Survey of unit performed for contraband, proper placement and condition of furniture, tampering with fixtures in bathroom, shower, and each patient room: Yes.  ; Findings:  APPEARANCE/BEHAVIOR Calm and cooperative NEURO ASSESSMENT Orientation: oriented x3  Denies pain Hallucinations: No.None noted (Hallucinations)  Denies  Speech: Normal Gait: normal RESPIRATORY ASSESSMENT Even  Unlabored respirations  CARDIOVASCULAR ASSESSMENT Pulses equal   regular rate  Skin warm and dry   GASTROINTESTINAL ASSESSMENT no GI complaint EXTREMITIES Full ROM  PLAN OF CARE Provide calm/safe environment. Vital signs assessed twice daily. ED BHU Assessment once each 12-hour shift. Assure the ED provider has rounded once each shift. Provide and encourage hygiene. Provide redirection as needed. Assess for escalating behavior; address immediately and inform ED provider.  Assess family dynamic and appropriateness for visitation as needed: Yes.  ; If necessary, describe findings:  Educate the patient/family about BHU procedures/visitation: Yes.  ; If necessary, describe findings:

## 2019-08-30 NOTE — ED Notes (Addendum)
He is sitting up on the side of the bed  Smiling and waving  - introduced myself to him  We discussed how he was feeling   He denies pain   He states  "I feel good today - can I get something to eat"    Drink and snack provided  Oriented him to exact time and that breakfast will arrive shortly  Verbalizes agreement

## 2019-08-30 NOTE — ED Notes (Signed)
BEHAVIORAL HEALTH ROUNDING Patient sleeping: No. Patient alert and oriented: yes Behavior appropriate: Yes.  ; If no, describe:  Nutrition and fluids offered: yes Toileting and hygiene offered: Yes  Sitter present: q15 minute observations  Law enforcement present: Yes  [employer]   ENVIRONMENTAL ASSESSMENT Potentially harmful objects out of patient reach: Yes.   Personal belongings secured: Yes.   Patient dressed in hospital provided attire only: Yes.   Plastic bags out of patient reach: Yes.   Patient care equipment (cords, cables, call bells, lines, and drains) shortened, removed, or accounted for: Yes.   Equipment and supplies removed from bottom of stretcher: Yes.   Potentially toxic materials out of patient reach: Yes.   Sharps container removed or out of patient reach: Yes.   

## 2019-08-30 NOTE — ED Notes (Signed)
Pt asleep, breakfast tray placed in rm.  

## 2019-08-30 NOTE — ED Notes (Signed)
BEHAVIORAL HEALTH ROUNDING Patient sleeping: Yes.   Patient alert and oriented: eyes closed  Appears asleep Behavior appropriate: Yes.  ; If no, describe:  Nutrition and fluids offered: Yes  Toileting and hygiene offered: sleeping Sitter present: q 15 minute observations  Law enforcement present: yes  [employer] 

## 2019-08-30 NOTE — ED Notes (Signed)
Gave food tray with juice. 

## 2019-08-30 NOTE — ED Notes (Signed)
BEHAVIORAL HEALTH ROUNDING Patient sleeping: Yes.   Patient alert and oriented: eyes closed  Appears asleep Behavior appropriate: Yes.  ; If no, describe:  Nutrition and fluids offered: Yes  Toileting and hygiene offered: sleeping Sitter present: q 15 minute observations  Law enforcement present: yes   

## 2019-08-31 DIAGNOSIS — F209 Schizophrenia, unspecified: Secondary | ICD-10-CM | POA: Diagnosis not present

## 2019-08-31 NOTE — ED Notes (Signed)
Hourly rounding reveals patient in room. No complaints, stable, in no acute distress. Q15 minute rounds and monitoring via Rover and Officer to continue.   

## 2019-08-31 NOTE — ED Provider Notes (Signed)
-----------------------------------------   2:21 AM on 08/31/2019 -----------------------------------------   Blood pressure 99/70, pulse 74, temperature 98 F (36.7 C), temperature source Oral, resp. rate 19, height 5\' 11"  (1.803 m), weight 72.6 kg, SpO2 97 %.  The patient had no acute events since last update.  Calm and cooperative at this time.  Disposition is pending per Psychiatry/Behavioral Medicine team recommendations.     Alfred Levins, [place], MD 08/31/19 [phone]

## 2019-08-31 NOTE — ED Notes (Signed)
Report to include Situation, Background, Assessment, and Recommendations received from Jadeka RN. Patient alert and oriented, warm and dry, in no acute distress. Patient denies SI, HI, AVH and pain. Patient made aware of Q15 minute rounds and Rover and Officer presence for their safety. Patient instructed to come to me with needs or concerns.  

## 2019-08-31 NOTE — ED Notes (Signed)
Hourly rounding reveals patient sleeping in room. No complaints, stable, in no acute distress. Q15 minute rounds and monitoring via Security to continue. 

## 2019-09-01 DIAGNOSIS — F209 Schizophrenia, unspecified: Secondary | ICD-10-CM | POA: Diagnosis not present

## 2019-09-01 NOTE — ED Notes (Signed)
Hourly rounding reveals patient in room. No complaints, stable, in no acute distress. Q15 minute rounds and monitoring via Rover and Officer to continue.   

## 2019-09-01 NOTE — ED Notes (Signed)
ED  Is the patient under IVC or is there intent for IVC:  He is voluntary  Is the patient medically cleared: Yes.   Is there vacancy in the ED BHU: Yes.   Is the population mix appropriate for patient:  He wears a depend Is the patient awaiting placement in inpatient or outpatient setting: Yes.  Social work is seeking him a safe place to live   Has the patient had a psychiatric consult: Yes.   Survey of unit performed for contraband, proper placement and condition of furniture, tampering with fixtures in bathroom, shower, and each patient room: Yes.  ; Findings:  APPEARANCE/BEHAVIOR Calm and cooperative NEURO ASSESSMENT Orientation: oriented x3  Denies pain Hallucinations: No.None noted (Hallucinations)  Denies  Speech: Normal Gait: normal RESPIRATORY ASSESSMENT Even  Unlabored respirations  CARDIOVASCULAR ASSESSMENT Pulses equal   regular rate  Skin warm and dry   GASTROINTESTINAL ASSESSMENT no GI complaint EXTREMITIES Full ROM  PLAN OF CARE Provide calm/safe environment. Vital signs assessed twice daily. ED BHU Assessment once each 12-hour shift. Assure the ED provider has rounded once each shift. Provide and encourage hygiene. Provide redirection as needed. Assess for escalating behavior; address immediately and inform ED provider.  Assess family dynamic and appropriateness for visitation as needed: Yes.  ; If necessary, describe findings:  Educate the patient/family about BHU procedures/visitation: Yes.  ; If necessary, describe findings:

## 2019-09-01 NOTE — ED Notes (Signed)
BEHAVIORAL HEALTH ROUNDING Patient sleeping: No. Patient alert : yes Behavior appropriate: Yes.  ; If no, describe:  Nutrition and fluids offered: yes Toileting and hygiene offered: Yes  Sitter present: q15 minute observations Law enforcement present: Yes   [employer]  

## 2019-09-01 NOTE — [place] FL2 (Signed)
[place] MEDICAID FL2 LEVEL OF CARE SCREENING TOOL     IDENTIFICATION  Patient Name: Roberto Vasquez Birthdate: May 05, 1961 Sex: male Admission Date (Current Location): 07/13/2019  [place] and [place] Number:  [address] [id number] [facility] Facility and Address:  [facility], [address], [place], [place] [address]      Provider Number: [id number]  Attending Physician Name and Address:  No att. providers found  Relative Name and Phone Number:  DSS Legal Guardian Saintclair Halsted, office : [phone]    Current Level of Care: Hospital Recommended Level of Care: Assisted Living Facility Prior Approval Number:    Date Approved/Denied:   PASRR Number:    Discharge Plan: Other (Comment)(ALF)    Current Diagnoses: Patient Active Problem List   Diagnosis Date Noted  . UTI (urinary tract infection) 05/14/2018  . Seizure (HCC) 04/06/2018  . Tobacco use disorder 02/23/2017  . Schizophrenia (HCC) 02/20/2017  . Noncompliance 02/20/2017  . HTN (hypertension) 03/01/2015    Orientation RESPIRATION BLADDER Height & Weight     Self, Time, Situation, Place  Normal Incontinent Weight: 72.6 kg Height:  5\' 11"  (180.3 cm)  BEHAVIORAL SYMPTOMS/MOOD NEUROLOGICAL BOWEL NUTRITION STATUS  Other (Comment)(diagnosed with schizophrenia)   Incontinent Diet  AMBULATORY STATUS COMMUNICATION OF NEEDS Skin   Independent Verbally Normal                       Personal Care Assistance Level of Assistance  Bathing, Feeding, Dressing Bathing Assistance: Limited assistance Feeding assistance: Limited assistance Dressing Assistance: Limited assistance     Functional Limitations Info  Sight, Hearing, Speech Sight Info: Adequate Hearing Info: Adequate Speech Info: Adequate    SPECIAL CARE FACTORS FREQUENCY                       Contractures Contractures Info: Present    Additional Factors Info  Code Status, Allergies Code Status Info: FULL Allergies  Info: No known allergies           Current Medications (09/01/2019):  This is the current hospital active medication list Current Facility-Administered Medications  Medication Dose Route Frequency Provider Last Rate Last Admin  . hydrochlorothiazide (MICROZIDE) capsule 12.5 mg  12.5 mg Oral Daily 14/06/2019, MD   12.5 mg at 09/01/19 0951  . QUEtiapine (SEROQUEL) tablet 200 mg  200 mg Oral QHS Cristofano, Paul A, MD   200 mg at 08/31/19 2206  . traZODone (DESYREL) tablet 100 mg  100 mg Oral QHS Cristofano, 2207, MD   100 mg at 08/30/19 2249   Current Outpatient Medications  Medication Sig Dispense Refill  . amLODipine (NORVASC) 10 MG tablet Take 10 mg by mouth daily.    . ARIPiprazole ER (ABILIFY MAINTENA) 300 MG PRSY prefilled syringe Inject 300 mg into the muscle every 28 (twenty-eight) days.    . fesoterodine (TOVIAZ) 4 MG TB24 tablet Take 1 tablet (4 mg total) by mouth daily. 90 tablet 3  . hydrochlorothiazide (MICROZIDE) 12.5 MG capsule Take 12.5 mg by mouth daily.    2250 lisinopril (PRINIVIL,ZESTRIL) 20 MG tablet Take 20 mg by mouth every morning.    Marland Kitchen QUEtiapine (SEROQUEL) 200 MG tablet Take 1 tablet (200 mg total) by mouth at bedtime. 15 tablet 0  . traZODone (DESYREL) 100 MG tablet Take 1 tablet (100 mg total) by mouth at bedtime. 30 tablet 0  . cephALEXin (KEFLEX) 500 MG capsule Take 1 capsule (500 mg total) by mouth 3 (  three) times daily. (Patient not taking: Reported on 10/26/2018) 12 capsule 0  . POTASSIUM CHLORIDE PO Take by mouth.       Discharge Medications: Please see discharge summary for a list of discharge medications.  Relevant Imaging Results:  Relevant Lab Results:   Additional Information SSN:    [id number]  Trecia Rogers, [place]

## 2019-09-01 NOTE — ED Notes (Signed)
Patient observed lying in bed with eyes closed  Even, unlabored respirations observed   NAD pt appears to be sleeping  I will continue to monitor along with every 15 minute visual observations     

## 2019-09-01 NOTE — ED Notes (Addendum)
BEHAVIORAL HEALTH ROUNDING Patient sleeping: No. Patient alert : yes Behavior appropriate: Yes.  ; If no, describe:  Nutrition and fluids offered: yes Toileting and hygiene offered: Yes  Sitter present: q15 minute observations Law enforcement present: Yes   [employer]  

## 2019-09-01 NOTE — ED Notes (Signed)
Pt offered bathroom. Pt's brief currently dry at this time.

## 2019-09-01 NOTE — ED Notes (Signed)
BEHAVIORAL HEALTH ROUNDING Patient sleeping: Yes.   Patient alert and oriented: eyes closed  Appears asleep Behavior appropriate: Yes.  ; If no, describe:  Nutrition and fluids offered: Yes  Toileting and hygiene offered: sleeping Sitter present: q 15 minute observations  Law enforcement present: yes  [employer] 

## 2019-09-01 NOTE — Social Work (Signed)
CSW updated patient's FL2 and awaiting doctor signature.  CSW resent out to different placements.  CSW attempted to call pt's legal guardian, Kalman Drape [phone]). CSW left a voicemail.      Ardelle Anton, MSW, [facility] ([place]) Phone: [phone] Fax: [phone]

## 2019-09-01 NOTE — Social Work (Signed)
CSW received a call from pt's legal guardian, Kalman Drape. Rhesha stated that she has a place that is going to call her back about possible admission. Rhesha stated that she will keep CSW updated and that she will also continue to send things out.

## 2019-09-01 NOTE — ED Notes (Signed)
Report to include Situation, Background, Assessment, and Recommendations received from Amy RN. Patient alert and oriented, warm and dry, in no acute distress. Patient denies SI, HI, AVH and pain. Patient made aware of Q15 minute rounds and Rover and Officer presence for their safety. Patient instructed to come to me with needs or concerns.   

## 2019-09-01 NOTE — ED Provider Notes (Signed)
-----------------------------------------   6:56 AM on 09/01/2019 -----------------------------------------   Blood pressure 124/79, pulse 69, temperature 98.3 F (36.8 C), temperature source Oral, resp. rate 16, height 5\' 11"  (1.803 m), weight 72.6 kg, SpO2 99 %.  The patient had no acute events since last update.  Calm and cooperative at this time.  Disposition is pending per Psychiatry/Behavioral Medicine team recommendations.     Rudene Re, MD 09/01/19 [phone]

## 2019-09-01 NOTE — ED Notes (Signed)
VOL  PENDING  PLACEMENT 

## 2019-09-01 NOTE — ED Notes (Signed)
Pt ate all of his breakfast and had 240 mLs of orange juice. This tech sat with pt as pt ate his breakfast.

## 2019-09-01 NOTE — ED Notes (Signed)
BEHAVIORAL HEALTH ROUNDING Patient sleeping: Yes.   Patient alert and oriented: eyes closed  Appears asleep Behavior appropriate: Yes.  ; If no, describe:  Nutrition and fluids offered: Yes  Toileting and hygiene offered: sleeping Sitter present: q 15 minute observations  Law enforcement present: yes  [employer]   ENVIRONMENTAL ASSESSMENT Potentially harmful objects out of patient reach: Yes.   Personal belongings secured: Yes.   Patient dressed in hospital provided attire only: Yes.   Plastic bags out of patient reach: Yes.   Patient care equipment (cords, cables, call bells, lines, and drains) shortened, removed, or accounted for: Yes.   Equipment and supplies removed from bottom of stretcher: Yes.   Potentially toxic materials out of patient reach: Yes.   Sharps container removed or out of patient reach: Yes.   

## 2019-09-02 DIAGNOSIS — F209 Schizophrenia, unspecified: Secondary | ICD-10-CM | POA: Diagnosis not present

## 2019-09-02 NOTE — ED Notes (Signed)
BEHAVIORAL HEALTH ROUNDING Patient sleeping: No. Patient alert and oriented: yes Behavior appropriate: Yes.  ; If no, describe:  Nutrition and fluids offered: yes Toileting and hygiene offered: Yes  Sitter present: q15 minute observations  

## 2019-09-02 NOTE — ED Notes (Signed)
Hourly rounding reveals patient in room. No complaints, stable, in no acute distress. Q15 minute rounds and monitoring via Rover and Officer to continue.   

## 2019-09-02 NOTE — ED Notes (Signed)
Pt given meal tray. Pt ate 75% of meal and have 480 mL of fluid.

## 2019-09-02 NOTE — ED Notes (Signed)
BEHAVIORAL HEALTH ROUNDING Patient sleeping: No. Patient alert and oriented: yes Behavior appropriate: Yes.  ; If no, describe:  Nutrition and fluids offered: yes Toileting and hygiene offered: Yes  Sitter present: q15 minute observations  Law enforcement present: Yes [employer]  

## 2019-09-02 NOTE — ED Notes (Signed)
Supper and extra drink provided  Undergarment changed  Room cleaned

## 2019-09-02 NOTE — ED Notes (Signed)
Pt. Requested and was given cranberry juice and crackers.

## 2019-09-02 NOTE — ED Notes (Signed)
Patient observed lying in bed with eyes closed  Even, unlabored respirations observed   NAD pt appears to be sleeping  I will continue to monitor along with every 15 minute visual observations     

## 2019-09-02 NOTE — ED Notes (Signed)
ED  Is the patient under IVC or is there intent for IVC: voluntary  Is the patient medically cleared: Yes.   Is there vacancy in the ED BHU: Yes.   Is the population mix appropriate for patient:  He wears depends   Is the patient awaiting placement in inpatient or outpatient setting: Yes.  Social work is finding him a safe place to live   Has the patient had a psychiatric consult: Yes.   Survey of unit performed for contraband, proper placement and condition of furniture, tampering with fixtures in bathroom, shower, and each patient room: Yes.  ; Findings:  APPEARANCE/BEHAVIOR Calm and cooperative NEURO ASSESSMENT Orientation: oriented x3  Denies pain Hallucinations: No.None noted (Hallucinations)  Denies  Speech: Normal Gait: normal RESPIRATORY ASSESSMENT Even  Unlabored respirations  CARDIOVASCULAR ASSESSMENT Pulses equal   regular rate  Skin warm and dry   GASTROINTESTINAL ASSESSMENT no GI complaint EXTREMITIES Full ROM  PLAN OF CARE Provide calm/safe environment. Vital signs assessed twice daily. ED BHU Assessment once each 12-hour shift.  Assure the ED provider has rounded once each shift. Provide and encourage hygiene. Provide redirection as needed. Assess for escalating behavior; address immediately and inform ED provider.  Assess family dynamic and appropriateness for visitation as needed: Yes.  ; If necessary, describe findings:  Educate the patient/family about BHU procedures/visitation: Yes.  ; If necessary, describe findings:

## 2019-09-02 NOTE — ED Notes (Signed)
VOL/Pending Placement 

## 2019-09-02 NOTE — ED Notes (Signed)
BEHAVIORAL HEALTH ROUNDING Patient sleeping: No. Patient alert : yes Behavior appropriate: Yes.  ; If no, describe:  Nutrition and fluids offered: yes Toileting and hygiene offered: Yes  Sitter present: q15 minute observations Law enforcement present: Yes   [employer]  

## 2019-09-02 NOTE — ED Notes (Signed)
BEHAVIORAL HEALTH ROUNDING Patient sleeping: Yes.   Patient alert and oriented: eyes closed  Appears asleep Behavior appropriate: Yes.  ; If no, describe:  Nutrition and fluids offered: Yes  Toileting and hygiene offered: sleeping Sitter present: q 15 minute observations  Law enforcement present: yes  [employer]   ENVIRONMENTAL ASSESSMENT Potentially harmful objects out of patient reach: Yes.   Personal belongings secured: Yes.   Patient dressed in hospital provided attire only: Yes.   Plastic bags out of patient reach: Yes.   Patient care equipment (cords, cables, call bells, lines, and drains) shortened, removed, or accounted for: Yes.   Equipment and supplies removed from bottom of stretcher: Yes.   Potentially toxic materials out of patient reach: Yes.   Sharps container removed or out of patient reach: Yes.   

## 2019-09-02 NOTE — ED Provider Notes (Addendum)
-----------------------------------------   4:28 AM on 09/02/2019 -----------------------------------------   Blood pressure 116/74, pulse (!) 58, temperature 98.4 F (36.9 C), temperature source Oral, resp. rate 16, height 1.803 m (5\' 11" ), weight 72.6 kg, SpO2 97 %.  The patient is calm and cooperative at this time.  There have been no acute events since the last update.  Awaiting disposition plan from Behavioral Medicine and/or Social Work team(s).   Hinda Kehr, MD 09/02/19 6270    Hinda Kehr, MD 09/02/19 [phone]

## 2019-09-02 NOTE — ED Notes (Signed)
BEHAVIORAL HEALTH ROUNDING Patient sleeping: Yes.   Patient alert and oriented: eyes closed  Appears asleep Behavior appropriate: Yes.  ; If no, describe:  Nutrition and fluids offered: Yes  Toileting and hygiene offered: sleeping Sitter present: q 15 minute observations  Law enforcement present: yes  [employer] 

## 2019-09-03 DIAGNOSIS — F209 Schizophrenia, unspecified: Secondary | ICD-10-CM | POA: Diagnosis not present

## 2019-09-03 NOTE — ED Notes (Signed)
Pt. Up using bathroom, pt. Returned to room with steady gait.  Pt. Requested and was given snack and drink.

## 2019-09-03 NOTE — ED Notes (Signed)
Pt given meal tray and sits up at bedside to eat 100% of meal. Pt states that he slept well and feels good. Pt compliant with vital signs.

## 2019-09-03 NOTE — ED Notes (Signed)
Pt. Requested and was given cranberry juice and crackers with night time medications.

## 2019-09-03 NOTE — ED Notes (Signed)
Pt given meal tray.

## 2019-09-03 NOTE — ED Notes (Signed)
Meal tray given 

## 2019-09-03 NOTE — ED Notes (Signed)
Pt. Sleeping under covers in room, light on.

## 2019-09-03 NOTE — ED Notes (Signed)
Pt reports being dry at this time.

## 2019-09-03 NOTE — ED Notes (Signed)
Pt given meal tray per request. Ice water given to drink. 244mL

## 2019-09-04 DIAGNOSIS — F209 Schizophrenia, unspecified: Secondary | ICD-10-CM | POA: Diagnosis not present

## 2019-09-04 NOTE — ED Notes (Signed)
Pt assisted to bathroom

## 2019-09-04 NOTE — ED Notes (Signed)
Changed pt pants and bed linen.

## 2019-09-04 NOTE — ED Notes (Signed)
Hourly rounding reveals patient in room. No complaints, stable, in no acute distress. Q15 minute rounds and monitoring via Rover and Officer to continue.   

## 2019-09-04 NOTE — ED Notes (Signed)
Report to include Situation, Background, Assessment, and Recommendations received from Sarah RN. Patient alert and oriented, warm and dry, in no acute distress. Patient denies SI, HI, AVH and pain. Patient made aware of Q15 minute rounds and Rover and Officer presence for their safety. Patient instructed to come to me with needs or concerns.   

## 2019-09-04 NOTE — ED Notes (Signed)
Meal tray given. Pt ate 100%

## 2019-09-04 NOTE — ED Provider Notes (Signed)
-----------------------------------------   6:47 AM on 09/04/2019 -----------------------------------------   Blood pressure 134/86, pulse 67, temperature 97.6 F (36.4 C), temperature source Oral, resp. rate 18, height 5\' 11"  (1.803 m), weight 72.6 kg, SpO2 96 %.  The patient is sleeping at this time.  There have been no acute events since the last update.  Awaiting disposition plan from Behavioral Medicine and/or Social Work team(s).   Paulette Blanch, MD 09/04/19 [phone]

## 2019-09-05 DIAGNOSIS — F209 Schizophrenia, unspecified: Secondary | ICD-10-CM | POA: Diagnosis not present

## 2019-09-05 NOTE — ED Notes (Signed)
Hourly rounding reveals patient in room. No complaints, stable, in no acute distress. Q15 minute rounds and monitoring via Rover and Officer to continue.   

## 2019-09-05 NOTE — Social Work (Signed)
CSW faxed over clinical documentation to Lissa Hoard at [facility] in [place], [place] to [phone].  Michelle's phone number is: [phone].   Awaiting decision.     Ardelle Anton, MSW, [facility] ([place]) Phone: [phone] Fax: [phone]

## 2019-09-05 NOTE — ED Notes (Signed)
Assumed care of patient aox4, denies any pain or concerns. Patient in bed with covers over head. Shower offered patient declined. Ate 100% of his breakfast as per prior nurse. Safety maintained. Will monitor. Lunch pending.

## 2019-09-05 NOTE — ED Notes (Signed)
Pt given meal tray.

## 2019-09-05 NOTE — ED Provider Notes (Signed)
-----------------------------------------   9:30 AM on 09/05/2019 -----------------------------------------   Blood pressure 134/86, pulse 67, temperature 97.6 F (36.4 C), temperature source Oral, resp. rate 18, height 5\' 11"  (1.803 m), weight 72.6 kg, SpO2 96 %.  The patient is calm and cooperative at this time.  There have been no acute events since the last update.  Awaiting disposition plan from Behavioral Medicine and/or Social Work team(s).    Nena Polio, MD 09/05/19 0930

## 2019-09-05 NOTE — ED Notes (Signed)
Vol/Pending placement  

## 2019-09-05 NOTE — ED Notes (Signed)
In bed with covers over head.

## 2019-09-06 DIAGNOSIS — F209 Schizophrenia, unspecified: Secondary | ICD-10-CM | POA: Diagnosis not present

## 2019-09-06 NOTE — Social Work (Signed)
Patient's legal guardian, Kalman Drape contacted CSW to discuss the patient's case. CSW expressed that the patient has been at the hospital for 59 days. CSW expressed that CSW has talked to the supervisor that stated that someone needs to come pick up the patient since he does have a legal guardian. Pt's legal guardian stated that he has no where to go since he cannot go back to his group home and that she has no place for him to go.  Pt's legal guardian stated that [facility] from [place] is coming to the hospital either Wednesday (12/16) or Thursday (12/17) to do an assessment on the patient.  Pt's legal guardian stated that [facility] in [place], [place] might can take him if he fits their diagnosis criteria. CSW stated that she will find out if they can or not. CSW called pt's legal guardian back and left a voicemail that the [facility] needs an application filled out (told the legal guardian where to find the application which is on the [facility] website) and to fill that out and then CSW can send the additional information (H&P, nursing notes for the past 2 weeks, FL2, PSARR level, and medication list) on over to [facility].     Ardelle Anton, MSW, [facility] ([place]) Phone: [phone] Fax: [phone]

## 2019-09-06 NOTE — Social Work (Signed)
CSW got a call back from pt's legal guardian, Roberto Vasquez. Pt's legal guardian stated that she will fill out the application for the [facility] and send it to [facility] to send with additional hospital info.  Pt's legal guardian stated that Roberto Vasquez's will be coming out for an assessment on Thursday between 10:30am and 11am.  Pt's legal guardian asked if the patient can be dry and clean for these assessments and not to let him know because he will self-sabatoge the assessments with different placements.        Ardelle Anton, MSW, [facility] ([place]) Phone: [phone] Fax: [phone]

## 2019-09-06 NOTE — ED Notes (Signed)
Hourly rounding reveals patient sleeping in room. No complaints, stable, in no acute distress. Q15 minute rounds and monitoring via Rover and Officer to continue.  

## 2019-09-06 NOTE — ED Notes (Signed)
Hourly rounding reveals patient in room. No complaints, stable, in no acute distress. Q15 minute rounds and monitoring via Rover and Officer to continue.   

## 2019-09-06 NOTE — ED Notes (Signed)
Patient in shower 

## 2019-09-06 NOTE — ED Notes (Signed)
BEHAVIORAL HEALTH ROUNDING Patient sleeping: Yes.   Patient alert and oriented: eyes closed  Appears asleep Behavior appropriate: Yes.  ; If no, describe:  Nutrition and fluids offered: Yes  Toileting and hygiene offered: sleeping Sitter present: q 15 minute observations 

## 2019-09-06 NOTE — ED Notes (Signed)
BEHAVIORAL HEALTH ROUNDING Patient sleeping: yes Patient alert and oriented: no, sleeping Behavior appropriate: Yes.  ; If no, describe:  Nutrition and fluids offered: yes Toileting and hygiene offered: no, sleeping Sitter present: q15 minute observations  Law enforcement present: Yes  [employer]

## 2019-09-06 NOTE — ED Notes (Signed)

## 2019-09-06 NOTE — ED Notes (Signed)
Hourly rounding reveals patient awake in room. No complaints, stable, in no acute distress. Q15 minute rounds and monitoring via Security Cameras to continue. 

## 2019-09-06 NOTE — Social Work (Signed)
CSW attempted to contact pt's legal guardian, Kalman Drape ([phone]) to discuss the patient being in the ED for going on 59 days and discuss a safe discharge plan. CSW left a voicemail.       Ardelle Anton, MSW, [facility] ([place]) Phone: [phone] Fax: [phone]

## 2019-09-06 NOTE — Social Work (Signed)
CSW attempted to contact pt's legal guardian, Kalman Drape, again. CSW was unable to get in contact with pt's legal guardian.       Ardelle Anton, MSW, [facility] ([place]) Phone: [phone] Fax: [phone]

## 2019-09-06 NOTE — ED Notes (Addendum)
BEHAVIORAL HEALTH ROUNDING Patient sleeping: Yes. Patient alert and oriented: no sleeping Behavior appropriate: Yes.  ; If no, describe:  Nutrition and fluids offered: yes Toileting and hygiene offered: no, sleeping  Sitter present: q15 minute observations  Law enforcement present: Yes  [employer]

## 2019-09-06 NOTE — ED Notes (Signed)
BEHAVIORAL HEALTH ROUNDING Patient sleeping: No. Patient alert and oriented: yes Behavior appropriate: Yes.  ; If no, describe:  Nutrition and fluids offered: yes Toileting and hygiene offered: Yes  Sitter present: q15 minute observations  Law enforcement present: Yes [employer]  

## 2019-09-06 NOTE — ED Notes (Signed)
Patient observed lying in bed with eyes closed  Even, unlabored respirations observed   NAD pt appears to be sleeping  I will continue to monitor along with every 15 minute visual observations     

## 2019-09-06 NOTE — ED Notes (Signed)
Report to include situation, background, assessment and recommendations from Ros[place]. Patient sleeping, respirations regular and unlabored. Q15 minute rounds and Officer observation to continue.

## 2019-09-06 NOTE — ED Notes (Signed)
Report to include Situation, Background, Assessment, and Recommendations received from Jeannette RN. Patient alert and oriented, warm and dry, in no acute distress. Patient denies SI, HI, AVH and pain. Patient made aware of Q15 minute rounds and Rover and Officer presence for their safety. Patient instructed to come to me with needs or concerns.   

## 2019-09-06 NOTE — ED Notes (Signed)
BEHAVIORAL HEALTH ROUNDING Patient sleeping: Yes.   Patient alert and oriented: eyes closed  Appears asleep Behavior appropriate: Yes.  ; If no, describe:  Nutrition and fluids offered: Yes  Toileting and hygiene offered: sleeping Sitter present: q 15 minute observations  Law enforcement present: yes  [employer]   ENVIRONMENTAL ASSESSMENT Potentially harmful objects out of patient reach: Yes.   Personal belongings secured: Yes.   Patient dressed in hospital provided attire only: Yes.   Plastic bags out of patient reach: Yes.   Patient care equipment (cords, cables, call bells, lines, and drains) shortened, removed, or accounted for: Yes.   Equipment and supplies removed from bottom of stretcher: Yes.   Potentially toxic materials out of patient reach: Yes.   Sharps container removed or out of patient reach: Yes.   

## 2019-09-06 NOTE — ED Provider Notes (Signed)
-----------------------------------------   6:42 AM on 09/06/2019 -----------------------------------------   Blood pressure 134/88, pulse 70, temperature 98.6 F (37 C), temperature source Oral, resp. rate 18, height 1.803 m (5\' 11" ), weight 72.6 kg, SpO2 97 %.  The patient is calm and cooperative at this time.  There have been no acute events since the last update.  Awaiting disposition plan from Behavioral Medicine and/or Social Work team(s).   Hinda Kehr, MD 09/06/19 [phone]

## 2019-09-06 NOTE — ED Notes (Signed)
Pt incontinent of urine. Pt was given clean clothes to change in to and floor in rm was mopped.

## 2019-09-06 NOTE — ED Notes (Signed)
Pt asleep, meal tray placed in rm.  

## 2019-09-07 DIAGNOSIS — F209 Schizophrenia, unspecified: Secondary | ICD-10-CM | POA: Diagnosis not present

## 2019-09-07 NOTE — ED Notes (Signed)
Hourly rounding reveals patient sleeping in room. No complaints, stable, in no acute distress. Q15 minute rounds and monitoring via Rover and Officer to continue.  

## 2019-09-07 NOTE — ED Notes (Signed)
breakfast  provided

## 2019-09-07 NOTE — ED Notes (Signed)
Hourly rounding reveals patient in room. No complaints, stable, in no acute distress. Q15 minute rounds and monitoring via Rover and Officer to continue.   

## 2019-09-07 NOTE — ED Notes (Signed)
Hourly rounding reveals patient awake in room. No complaints, stable, in no acute distress. Q15 minute rounds and monitoring via Rover and Officer to continue.  

## 2019-09-07 NOTE — ED Notes (Signed)
VOL/Pending Placement 

## 2019-09-07 NOTE — ED Notes (Signed)
Pt. Encouraged to use bathroom after finishing snack.

## 2019-09-07 NOTE — ED Provider Notes (Signed)
-----------------------------------------   4:11 AM on 09/07/2019 -----------------------------------------   Blood pressure 107/76, pulse 81, temperature 98.6 F (37 C), temperature source Oral, resp. rate 19, height 5\' 11"  (1.803 m), weight 72.6 kg, SpO2 100 %.  The patient had no acute events since last update.  Calm and cooperative at this time.  Disposition is pending per Psychiatry/Behavioral Medicine team recommendations.     Rudene Re, MD 09/07/19 [phone]

## 2019-09-07 NOTE — ED Notes (Signed)
Pt. Currently sleeping under covers on bed room #22.

## 2019-09-07 NOTE — ED Notes (Signed)
Patient showered and changed clothes, bed linen changed and room cleaned up

## 2019-09-07 NOTE — ED Notes (Signed)
Pt given cup of water 

## 2019-09-07 NOTE — ED Notes (Signed)
Patient talking loud and requesting breakfast. Patient states woke up hungry today. Patient advised breakfast on the way.

## 2019-09-07 NOTE — ED Notes (Signed)
Patient sleeping

## 2019-09-08 DIAGNOSIS — F209 Schizophrenia, unspecified: Secondary | ICD-10-CM | POA: Diagnosis not present

## 2019-09-08 NOTE — ED Notes (Signed)
Attempted to give pt meal tray. Pt state "I dont want my food", and laid back down to go to sleep

## 2019-09-08 NOTE — ED Notes (Signed)
Pt given dinner tray and drink 

## 2019-09-08 NOTE — ED Notes (Signed)
Pt given breakfast trays

## 2019-09-08 NOTE — ED Notes (Signed)
VOL/Pending Placement 

## 2019-09-08 NOTE — ED Notes (Signed)
Pt linen and behavioral clothing changed d/t pt being wet. Pt washed himself up with our wet wipes and pt verbalizes understanding that when he needs to use the restroom that he is to get up and go to the bathroom not urinate on himself'.

## 2019-09-09 DIAGNOSIS — F209 Schizophrenia, unspecified: Secondary | ICD-10-CM | POA: Diagnosis not present

## 2019-09-09 LAB — SARS CORONAVIRUS 2 (TAT 6-24 HRS): SARS Coronavirus 2: NEGATIVE

## 2019-09-09 MED ORDER — TUBERCULIN PPD 5 UNIT/0.1ML ID SOLN
5.0000 [IU] | INTRADERMAL | Status: AC
  Administered 2019-09-09: 5 [IU] via INTRADERMAL
  Filled 2019-09-09: qty 0.1

## 2019-09-09 NOTE — ED Provider Notes (Signed)
-----------------------------------------   6:10 AM on 09/09/2019 -----------------------------------------   Blood pressure (!) 131/92, pulse 65, temperature 98.4 F (36.9 C), temperature source Oral, resp. rate 18, height 5\' 11"  (1.803 m), weight 72.6 kg, SpO2 99 %.  The patient is sleeping at this time.  There have been no acute events since the last update.  Awaiting disposition plan from Social Work team(s).   Paulette Blanch, MD 09/09/19 [phone]

## 2019-09-09 NOTE — Care Management (Addendum)
RN CM: Incoming call from guardian, Astrid Divine, states pt is potential for admission to [facility]. Requested face to face interview via Facetime with patient and administrator Mikle Bosworth or [employer].   RN CM called [employer] and set up time for [employer] interview with patient at 2:30pm.  RN CM discussed interview time with patient and patient verbalized understanding and agreeance to meeting.   RN CM completed succesful Facetime interview with [place] and patient. Patient very pleasant and in agreeance with transfer. Administrator will follow up with Rhesha, guardian and finalize discharge plans.   RN CM: Incoming call from [place], [place] states pt was accepted into [facility] and would need TB skin test and recent COVID test. Anticipating transfer on Monday 12/21.

## 2019-09-09 NOTE — ED Notes (Signed)
Pt. Currently sleeping in bed 22 A.

## 2019-09-09 NOTE — ED Notes (Signed)
TB skin test administered on left forearm (area marked with marker).  Needs to be read 09/11/19.

## 2019-09-10 DIAGNOSIS — F209 Schizophrenia, unspecified: Secondary | ICD-10-CM | POA: Diagnosis not present

## 2019-09-10 NOTE — ED Notes (Signed)
Pt. Laying in bed watching tv.  Pt. States he ate dinner and he did not need to use bathroom at this time.  Pt. Calm and cooperative and appears to be in a good mood.

## 2019-09-10 NOTE — ED Notes (Signed)
Pt up to use restroom with no assistance needed from staff

## 2019-09-10 NOTE — ED Notes (Signed)
Meal tray provided.

## 2019-09-10 NOTE — ED Notes (Signed)
Pt. Offered snack, pt. Declined.  Pt. Laying in bed watching tv, pt. Encouraged to use bathroom before going to sleep.

## 2019-09-10 NOTE — ED Notes (Signed)
Pt cleansed of urine and dried urine on sheets. Pt able to get up and change independently. Pt able to help wipe himself.

## 2019-09-10 NOTE — ED Notes (Signed)
Pt given meal tray.

## 2019-09-10 NOTE — ED Provider Notes (Signed)
-----------------------------------------   5:59 AM on 09/10/2019 -----------------------------------------   Blood pressure 128/74, pulse 74, temperature 98 F (36.7 C), temperature source Oral, resp. rate 18, height 5\' 11"  (1.803 m), weight 72.6 kg, SpO2 98 %.  The patient is sleeping at this time.  There have been no acute events since the last update.  Awaiting disposition plan from Social Work team(s).   Paulette Blanch, MD 09/10/19 [phone]

## 2019-09-11 DIAGNOSIS — F209 Schizophrenia, unspecified: Secondary | ICD-10-CM | POA: Diagnosis not present

## 2019-09-11 NOTE — ED Provider Notes (Signed)
-----------------------------------------   3:28 AM on 09/11/2019 -----------------------------------------  Blood pressure 134/89, pulse 65, temperature 98 F (36.7 C), temperature source Oral, resp. rate 18, height 5\' 11"  (1.803 m), weight 72.6 kg, SpO2 100 %.  The patient is calm and cooperative at this time.  There have been no acute events since the last update.  Awaiting disposition plan from Behavioral Medicine team.   Blake Divine, MD 09/11/19 [phone]

## 2019-09-11 NOTE — ED Notes (Signed)
Pt given breakfast tray

## 2019-09-11 NOTE — ED Notes (Signed)
Report given to Ann RN.

## 2019-09-11 NOTE — ED Notes (Signed)
Pt given dinner tray by [employer]

## 2019-09-11 NOTE — ED Notes (Signed)
Pt ate 30% of dinner, pt used restroom and is back in bed at this time

## 2019-09-12 DIAGNOSIS — F209 Schizophrenia, unspecified: Secondary | ICD-10-CM | POA: Diagnosis not present

## 2019-09-12 MED ORDER — TUBERCULIN PPD 5 UNIT/0.1ML ID SOLN
5.0000 [IU] | INTRADERMAL | Status: AC
  Filled 2019-09-12: qty 0.1

## 2019-09-12 NOTE — ED Notes (Signed)
Hourly rounding reveals patient resting. No complaints, stable, in no acute distress. Q15 minute rounds and monitoring via Rover and Officer to continue. 

## 2019-09-12 NOTE — ED Notes (Signed)
Hourly rounding reveals patient resting in bed. No complaints, stable, in no acute distress. Q15 minute rounds and monitoring via Rover and Officer to continue. 

## 2019-09-12 NOTE — ED Provider Notes (Signed)
-----------------------------------------   8:40 AM on 09/12/2019 -----------------------------------------   Blood pressure 115/84, pulse 65, temperature 98 F (36.7 C), temperature source Oral, resp. rate 16, height 5\' 11"  (1.803 m), weight 72.6 kg, SpO2 98 %.  The patient is calm and cooperative at this time.  Hopeful patient will be able to be discharged to assisted living today.    Delman Kitten, MD 09/12/19 [phone]

## 2019-09-12 NOTE — ED Notes (Signed)
Report from Amy B, RN. Patient sleeping, respirations regular and unlabored. Q15 minute rounds and observation by Engineer, drilling to continue.

## 2019-09-12 NOTE — Care Management (Addendum)
RN CM: Incoming call from Kalman Drape, [facility] worker, [phone] requesting confirmation of COVID and TB skin test. RN Confirmed both completed and patient is ready for discharge. Rhesha will follow up with RN CM after confirming transfer time with ALF.   RN CM: Notified by Astrid Divine that [facility] has rescinded their offer after speaking with previous group home. RN CM advised Rhesha that patient needed to be picked up from hospital. Astrid Divine advised her supervisor, Elaina Hoops, could be reached at [phone].   RN CM: Administrator, [facility], from [facility] will be coming to evaluate patient Tuesday morning.

## 2019-09-12 NOTE — ED Notes (Signed)
TB skin test read by this RN at 0900.  Arm clear of any induration.  Negative test result.

## 2019-09-12 NOTE — ED Provider Notes (Signed)
-----------------------------------------   3:52 AM on 09/12/2019 -----------------------------------------   Blood pressure 115/84, pulse 65, temperature 98 F (36.7 C), temperature source Oral, resp. rate 16, height 1.803 m (5\' 11" ), weight 72.6 kg, SpO2 98 %.  The patient is calm and cooperative at this time.  He is currently in his 61st day in the emergency department.  However he has a place in the [facility] assisted living facility and will likely be discharged there today.  He had a COVID-19 test that was negative less than 3 days ago and his TB test (QuantiFERON gold) was negative.   Hinda Kehr, MD 09/12/19 [phone]

## 2019-09-13 DIAGNOSIS — F209 Schizophrenia, unspecified: Secondary | ICD-10-CM | POA: Diagnosis not present

## 2019-09-13 NOTE — ED Notes (Signed)
BEHAVIORAL HEALTH ROUNDING Patient sleeping: Yes.   Patient alert and oriented: eyes closed  Appears asleep Behavior appropriate: Yes.  ; If no, describe:  Nutrition and fluids offered: Yes  Toileting and hygiene offered: sleeping Sitter present: q 15 minute observations  Law enforcement present: yes  [employer] 

## 2019-09-13 NOTE — ED Notes (Signed)
BEHAVIORAL HEALTH ROUNDING Patient sleeping: No. Patient alert and oriented: yes Behavior appropriate: Yes.  ; If no, describe:  Nutrition and fluids offered: yes Toileting and hygiene offered: Yes  Sitter present: q15 minute observations  Law enforcement present: Yes [employer]  

## 2019-09-13 NOTE — ED Notes (Signed)
Hourly rounding reveals patient sleeping in bed. No complaints, stable, in no acute distress. Q15 minute rounds and monitoring via Rover and Officer to continue. 

## 2019-09-13 NOTE — ED Notes (Signed)
Patient observed lying in bed with eyes closed  Even, unlabored respirations observed   NAD pt appears to be sleeping  I will continue to monitor along with every 15 minute visual observations     

## 2019-09-13 NOTE — ED Notes (Signed)
Hourly rounding reveals patient resting in bed. No complaints, stable, in no acute distress. Q15 minute rounds and monitoring via Rover and Officer to continue. 

## 2019-09-13 NOTE — Care Management (Signed)
RN CM: Notified by nurse that representative had arrived from [place] to interview patient and was in lobby. RN CM is not available to assist representative to room. Bedside nurse is aware and arranging staff to assist.

## 2019-09-13 NOTE — ED Notes (Signed)
VOL/Pending Placement 

## 2019-09-13 NOTE — ED Notes (Signed)
BEHAVIORAL HEALTH ROUNDING Patient sleeping: Yes.   Patient alert and oriented: eyes closed  Appears asleep Behavior appropriate: Yes.  ; If no, describe:  Nutrition and fluids offered: Yes  Toileting and hygiene offered: sleeping Sitter present: q 15 minute observations 

## 2019-09-13 NOTE — ED Notes (Signed)
BEHAVIORAL HEALTH ROUNDING Patient sleeping: Yes.   Patient alert and oriented: eyes closed  Appears asleep Behavior appropriate: Yes.  ; If no, describe:  Nutrition and fluids offered: Yes  Toileting and hygiene offered: sleeping Sitter present: q 15 minute observations  Law enforcement present: yes  [employer]   ENVIRONMENTAL ASSESSMENT Potentially harmful objects out of patient reach: Yes.   Personal belongings secured: Yes.   Patient dressed in hospital provided attire only: Yes.   Plastic bags out of patient reach: Yes.   Patient care equipment (cords, cables, call bells, lines, and drains) shortened, removed, or accounted for: Yes.   Equipment and supplies removed from bottom of stretcher: Yes.   Potentially toxic materials out of patient reach: Yes.   Sharps container removed or out of patient reach: Yes.   

## 2019-09-13 NOTE — ED Notes (Signed)
Angelena Sole here from [facility] group home to assess pt for placement

## 2019-09-13 NOTE — ED Provider Notes (Signed)
-----------------------------------------   6:22 AM on 09/13/2019 -----------------------------------------   BP 99/77 (BP Location: Right Arm)   Pulse 68   Temp 97.7 F (36.5 C) (Oral)   Resp 16   Ht 5\' 11"  (1.803 m)   Wt 72.6 kg   SpO2 100%   BMI 22.32 kg/m   The patient is calm and cooperative at this time.  There have been no acute events since the last update.  Awaiting disposition plan from Behavioral Medicine and/or Social Work team(s).   Lilia Pro., MD 09/13/19 [phone]

## 2019-09-13 NOTE — ED Notes (Signed)
BEHAVIORAL HEALTH ROUNDING Patient sleeping: No. Patient alert and oriented: yes Behavior appropriate: Yes.  ; If no, describe:  Nutrition and fluids offered: yes Toileting and hygiene offered: Yes  Sitter present: q15 minute observations  Law enforcement present: Yes    

## 2019-09-14 DIAGNOSIS — F209 Schizophrenia, unspecified: Secondary | ICD-10-CM | POA: Diagnosis not present

## 2019-09-14 NOTE — Care Management (Signed)
RN CM: Notified patient was accepted to [facility] and would transition on 09/20/19 via legal guardian.

## 2019-09-14 NOTE — ED Notes (Signed)
Pt resting quietly in room, room darkened for comfort, pt laying with blanket over top of him. Empty food trays on floor next to bed, informed pt I had medications for him, pt agreeable to taking meds.  Offered to help patient to the bathroom. Pt declined at this time. Asked patient if he was wet, pt stated he was not.

## 2019-09-14 NOTE — ED Notes (Signed)
Pt given meal tray.

## 2019-09-14 NOTE — ED Provider Notes (Signed)
-----------------------------------------   4:32 AM on 09/14/2019 -----------------------------------------   Blood pressure 122/76, pulse 80, temperature 98.3 F (36.8 C), temperature source Oral, resp. rate 17, height 5\' 11"  (1.803 m), weight 72.6 kg, SpO2 97 %.  The patient is calm and cooperative at this time.  There have been no acute events since the last update.  Awaiting disposition plan from Behavioral Medicine and/or Social Work team(s).   Arta Silence, MD 09/14/19 [phone]

## 2019-09-14 NOTE — ED Notes (Signed)
Breakfast given.  

## 2019-09-15 DIAGNOSIS — F209 Schizophrenia, unspecified: Secondary | ICD-10-CM | POA: Diagnosis not present

## 2019-09-15 NOTE — ED Notes (Signed)
BEHAVIORAL HEALTH ROUNDING Patient sleeping: No. Patient alert and oriented: yes Behavior appropriate: Yes.  ; If no, describe:  Nutrition and fluids offered: yes Toileting and hygiene offered: Yes  Sitter present: q15 minute observations  Law enforcement present: Yes [employer]  

## 2019-09-15 NOTE — ED Notes (Signed)
Pt. Laying down in bed watching tv.

## 2019-09-15 NOTE — ED Notes (Signed)
BEHAVIORAL HEALTH ROUNDING Patient sleeping: Yes.   Patient alert and oriented: eyes closed  Appears asleep Behavior appropriate: Yes.  ; If no, describe:  Nutrition and fluids offered: Yes  Toileting and hygiene offered: sleeping Sitter present: q 15 minute observations  Law enforcement present: yes  ACSD 

## 2019-09-15 NOTE — ED Notes (Signed)
BEHAVIORAL HEALTH ROUNDING Patient sleeping: No. Patient alert and oriented: yes Behavior appropriate: Yes.  ; If no, describe:  Nutrition and fluids offered: yes Toileting and hygiene offered: Yes  Sitter present: q15 minute observations  

## 2019-09-15 NOTE — ED Notes (Signed)
BEHAVIORAL HEALTH ROUNDING Patient sleeping: Yes.   Patient alert and oriented: eyes closed  Appears asleep Behavior appropriate: Yes.  ; If no, describe:  Nutrition and fluids offered: Yes  Toileting and hygiene offered: sleeping Sitter present: q 15 minute observations  Law enforcement present: yes  [employer]  ENVIRONMENTAL ASSESSMENT Potentially harmful objects out of patient reach: Yes.   Personal belongings secured: Yes.   Patient dressed in hospital provided attire only: Yes.   Plastic bags out of patient reach: Yes.   Patient care equipment (cords, cables, call bells, lines, and drains) shortened, removed, or accounted for: Yes.   Equipment and supplies removed from bottom of stretcher: Yes.   Potentially toxic materials out of patient reach: Yes.   Sharps container removed or out of patient reach: Yes.   

## 2019-09-15 NOTE — ED Notes (Signed)
Patient observed lying in bed with eyes closed  Even, unlabored respirations observed   NAD pt appears to be sleeping  I will continue to monitor along with every 15 minute visual observations     

## 2019-09-15 NOTE — ED Notes (Signed)
BEHAVIORAL HEALTH ROUNDING Patient sleeping: Yes.   Patient alert and oriented: eyes closed  Appears asleep Behavior appropriate: Yes.  ; If no, describe:  Nutrition and fluids offered: Yes  Toileting and hygiene offered: sleeping Sitter present: q 15 minute observations  Law enforcement present: yes  [employer] 

## 2019-09-15 NOTE — ED Notes (Signed)
VOL/Pending placement 

## 2019-09-15 NOTE — ED Notes (Signed)
BEHAVIORAL HEALTH ROUNDING Patient sleeping: No. Patient alert and oriented: yes Behavior appropriate: Yes.  ; If no, describe:  Nutrition and fluids offered: yes Toileting and hygiene offered: Yes  Sitter present: q15 minute observations  Law enforcement present: Yes  [employer]  toileting and shower offered  - he declined at this time   Attempted to take VS and he stated " please leave me alone awhile

## 2019-09-15 NOTE — ED Notes (Signed)
ED  Is the patient under IVC or is there intent for IVC:  He is voluntary   Is the patient medically cleared: Yes.   Is there vacancy in the ED BHU: Yes.   Is the population mix appropriate for patient: Yes.  Pt with urinary incontinence at times  Is the patient awaiting placement in inpatient or outpatient setting: Yes.    Social work consult is in progress  - they are finding him a safe place to live Has the patient had a psychiatric consult: Yes.  He has been psychiatrically cleared for some time  Survey of unit performed for contraband, proper placement and condition of furniture, tampering with fixtures in bathroom, shower, and each patient room: Yes.  ; Findings:  APPEARANCE/BEHAVIOR Calm and cooperative   NEURO ASSESSMENT Orientation: oriented x alert  Denies pain Hallucinations: No.None noted (Hallucinations)  denies Speech: Normal Gait: normal RESPIRATORY ASSESSMENT Even  Unlabored respirations  CARDIOVASCULAR ASSESSMENT Pulses equal   regular rate  Skin warm and dry   GASTROINTESTINAL ASSESSMENT no GI complaint EXTREMITIES Full ROM  PLAN OF CARE Provide calm/safe environment. Vital signs assessed twice daily. ED BHU Assessment once each 12-hour shift. Collaborate with TTS daily or as condition indicates. Assure the ED provider has rounded once each shift. Provide and encourage hygiene. Provide redirection as needed. Assess for escalating behavior; address immediately and inform ED provider.  Assess family dynamic and appropriateness for visitation as needed: Yes.  ; If necessary, describe findings:  Educate the patient/family about BHU procedures/visitation: Yes.  ; If necessary, describe findings:

## 2019-09-15 NOTE — ED Provider Notes (Signed)
-----------------------------------------   6:16 AM on 09/15/2019 -----------------------------------------   Blood pressure 122/84, pulse 88, temperature 98.2 F (36.8 C), temperature source Oral, resp. rate 18, height 5\' 11"  (1.803 m), weight 72.6 kg, SpO2 99 %.  The patient is sleeping at this time.  There have been no acute events since the last update.  Looks like from the clinical social work note, patient should be going to a group home on 12/29.   Paulette Blanch, MD 09/15/19 [phone]

## 2019-09-15 NOTE — ED Notes (Signed)
He completed a shower - clothing changed   Linens changed  Floors mopped by this RN

## 2019-09-16 DIAGNOSIS — F209 Schizophrenia, unspecified: Secondary | ICD-10-CM | POA: Diagnosis not present

## 2019-09-16 NOTE — ED Notes (Signed)
Pt given meal tray.

## 2019-09-16 NOTE — ED Notes (Signed)
Pt. Currently sleeping in bed 22 A. 

## 2019-09-16 NOTE — ED Notes (Signed)
Given dinner tray. Pants were wet so changes scrubs and went ahead and changes linens as well. asked if he wanted to eat he said no he wanted to go back to sleep.

## 2019-09-17 DIAGNOSIS — F209 Schizophrenia, unspecified: Secondary | ICD-10-CM | POA: Diagnosis not present

## 2019-09-17 NOTE — ED Notes (Signed)
Pt given meal tray and cranberry juice 

## 2019-09-17 NOTE — ED Notes (Signed)
Pt given cranberry juice.

## 2019-09-17 NOTE — ED Notes (Signed)
Pt ambulatory to restroom. Pt states that he doesn't need to use it at this time. Pt lights turned back off.

## 2019-09-17 NOTE — ED Notes (Signed)
Pt. Up using bathroom, pt. Returned to room with steady gait. 

## 2019-09-17 NOTE — ED Notes (Signed)
This tech checked pt's brief. Brief currently dry. Pt stated he did not need to use restroom.

## 2019-09-17 NOTE — ED Provider Notes (Signed)
-----------------------------------------   2:11 AM on 09/17/2019 -----------------------------------------   Blood pressure 115/83, pulse 69, temperature 97.8 F (36.6 C), temperature source Oral, resp. rate 18, height 5\' 11"  (1.803 m), weight 72.6 kg, SpO2 100 %.  The patient is calm and cooperative at this time.  There have been no acute events since the last update.  Awaiting disposition plan from Behavioral Medicine and/or Social Work team(s).    Merlyn Lot, MD 09/17/19 [phone]

## 2019-09-17 NOTE — ED Notes (Signed)
Pt comes out and wants to shower. Kayla, NT assisting pt in shower.

## 2019-09-17 NOTE — ED Notes (Signed)
Pt given meal tray.

## 2019-09-17 NOTE — ED Notes (Signed)
Pt refused shower.  

## 2019-09-18 DIAGNOSIS — F209 Schizophrenia, unspecified: Secondary | ICD-10-CM | POA: Diagnosis not present

## 2019-09-18 NOTE — ED Notes (Signed)
Pt given meal tray but is asleep at this time

## 2019-09-18 NOTE — ED Notes (Signed)
Pt given meal tray.

## 2019-09-18 NOTE — ED Provider Notes (Signed)
-----------------------------------------   7:19 AM on 09/18/2019 -----------------------------------------   Blood pressure 100/69, pulse 60, temperature 98.1 F (36.7 C), temperature source Oral, resp. rate 16, height 5\' 11"  (1.803 m), weight 72.6 kg, SpO2 100 %.  The patient is calm and cooperative at this time.  There have been no acute events since the last update.  Awaiting disposition plan from Behavioral Medicine and/or Social Work team(s).    Duffy Bruce, MD 09/18/19 [phone]

## 2019-09-19 NOTE — ED Notes (Signed)
Report to include Situation, Background, Assessment, and Recommendations received from Amy RN. Patient alert and oriented, warm and dry, in no acute distress. Patient denies SI, HI, AVH and pain. Patient made aware of Q15 minute rounds and Rover and Officer presence for their safety. Patient instructed to come to me with needs or concerns.   

## 2019-09-19 NOTE — ED Notes (Signed)
Hourly rounding reveals patient in room. No complaints, stable, in no acute distress. Q15 minute rounds and monitoring via Rover and Officer to continue.   

## 2019-09-19 NOTE — ED Notes (Signed)
BEHAVIORAL HEALTH ROUNDING Patient sleeping: Yes.   Patient alert and oriented: eyes closed  Appears asleep Behavior appropriate: Yes.  ; If no, describe:  Nutrition and fluids offered: Yes  Toileting and hygiene offered: sleeping Sitter present: q 15 minute observations  Law enforcement present: yes  ACSD 

## 2019-09-19 NOTE — ED Notes (Signed)
BEHAVIORAL HEALTH ROUNDING Patient sleeping: Yes.   Patient alert and oriented: eyes closed  Appears asleep Behavior appropriate: Yes.  ; If no, describe:  Nutrition and fluids offered: Yes  Toileting and hygiene offered: sleeping Sitter present: q 15 minute observations  Law enforcement present: yes  [employer] 

## 2019-09-19 NOTE — ED Provider Notes (Signed)
-----------------------------------------   4:22 AM on 09/19/2019 -----------------------------------------   Blood pressure (!) 122/96, pulse 89, temperature 97.7 F (36.5 C), temperature source Oral, resp. rate 17, height 5\' 11"  (1.803 m), weight 72.6 kg, SpO2 100 %.  The patient had no acute events since last update.  Calm and cooperative at this time.  Disposition is pending per Psychiatry/Behavioral Medicine team recommendations.     Alfred Levins, [place], MD 09/19/19 [phone]

## 2019-09-19 NOTE — ED Notes (Signed)
Offered him his am medication HCTZ  - he refused stating  "not today - I am not taking it today"

## 2019-09-19 NOTE — ED Notes (Signed)
Patient observed lying in bed with eyes closed  Even, unlabored respirations observed   NAD pt appears to be sleeping  I will continue to monitor along with every 15 minute visual observations     

## 2019-09-19 NOTE — ED Notes (Signed)
ED Is the patient under IVC or is there intent for IVC: voluntary   Is the patient medically cleared: Yes.   Is there vacancy in the ED BHU: Yes.   Is the population mix appropriate for patient: Yes.   Is the patient awaiting placement in inpatient or outpatient setting: Yes.  awaiting safe placement   Has the patient had a psychiatric consult: Yes.   Survey of unit performed for contraband, proper placement and condition of furniture, tampering with fixtures in bathroom, shower, and each patient room: Yes.  ; Findings:  APPEARANCE/BEHAVIOR Calm and cooperative NEURO ASSESSMENT Orientation: oriented x3  Denies pain Hallucinations: No.None noted (Hallucinations) denies  Speech: Normal Gait: normal RESPIRATORY ASSESSMENT Even  Unlabored respirations  CARDIOVASCULAR ASSESSMENT Pulses equal   regular rate  Skin warm and dry   GASTROINTESTINAL ASSESSMENT no GI complaint EXTREMITIES Full ROM  PLAN OF CARE Provide calm/safe environment. Vital signs assessed twice daily. ED BHU Assessment once each 12-hour shift.  Assure the ED provider has rounded once each shift. Provide and encourage hygiene. Provide redirection as needed. Assess for escalating behavior; address immediately and inform ED provider.  Assess family dynamic and appropriateness for visitation as needed: Yes.  ; If necessary, describe findings:  Educate the patient/family about BHU procedures/visitation: Yes.  ; If necessary, describe findings:

## 2019-09-19 NOTE — TOC Progression Note (Signed)
Transition of Care [facility]) - Progression Note    Patient Details  Name: Roberto Vasquez MRN: [id number] Date of Birth: Jul 24, 1961  Transition of Care [facility]) CM/SW Contact  Anselm Pancoast, RN Phone Number: 09/19/2019, 3:47 PM  Clinical Narrative:    Discharge plan continues to be [place] on 12/29. Unable to reach legal guardian Levander Campion to confirm due to no answer and no option to leave voicemail as voicemail full.        Expected Discharge Plan and Services                                                 Social Determinants of Health (SDOH) Interventions    Readmission Risk Interventions No flowsheet data found.

## 2019-09-19 NOTE — ED Notes (Signed)
Breakfast tray given. °

## 2019-09-19 NOTE — ED Notes (Signed)
BEHAVIORAL HEALTH ROUNDING Patient sleeping: Yes.   Patient alert and oriented: eyes closed  Appears asleep Behavior appropriate: Yes.  ; If no, describe:  Nutrition and fluids offered: Yes  Toileting and hygiene offered: sleeping Sitter present: q 15 minute observations  Law enforcement present: yes  [employer]   ENVIRONMENTAL ASSESSMENT Potentially harmful objects out of patient reach: Yes.   Personal belongings secured: Yes.   Patient dressed in hospital provided attire only: Yes.   Plastic bags out of patient reach: Yes.   Patient care equipment (cords, cables, call bells, lines, and drains) shortened, removed, or accounted for: Yes.   Equipment and supplies removed from bottom of stretcher: Yes.   Potentially toxic materials out of patient reach: Yes.   Sharps container removed or out of patient reach: Yes.   

## 2019-09-19 NOTE — ED Notes (Signed)
VOL  PENDING  PLACEMENT 

## 2019-09-20 MED ORDER — TRAZODONE HCL 100 MG PO TABS: 100.0000 mg | ORAL_TABLET | Freq: Every day | ORAL | 0 refills | Status: AC

## 2019-09-20 MED ORDER — AMLODIPINE BESYLATE 10 MG PO TABS: 10.0000 mg | ORAL_TABLET | Freq: Every day | ORAL | 1 refills | Status: AC

## 2019-09-20 MED ORDER — LISINOPRIL 20 MG PO TABS: 20.0000 mg | ORAL_TABLET | Freq: Every morning | ORAL | 1 refills | Status: AC

## 2019-09-20 MED ORDER — QUETIAPINE FUMARATE 200 MG PO TABS: 200.0000 mg | ORAL_TABLET | Freq: Every day | ORAL | 0 refills | Status: AC

## 2019-09-20 MED ORDER — ABILIFY MAINTENA 300 MG IM PRSY: 300.0000 mg | PREFILLED_SYRINGE | INTRAMUSCULAR | 1 refills | Status: AC

## 2019-09-20 MED ORDER — FESOTERODINE FUMARATE ER 4 MG PO TB24: 4.0000 mg | ORAL_TABLET | Freq: Every day | ORAL | 3 refills | Status: AC

## 2019-09-20 MED ORDER — POTASSIUM CHLORIDE ER 10 MEQ PO TBCR: 10.0000 meq | EXTENDED_RELEASE_TABLET | Freq: Every day | ORAL | 1 refills | Status: AC

## 2019-09-20 MED ORDER — HYDROCHLOROTHIAZIDE 12.5 MG PO CAPS: 12.5000 mg | ORAL_CAPSULE | Freq: Every day | ORAL | 1 refills | Status: AC

## 2019-09-20 NOTE — ED Notes (Signed)
BEHAVIORAL HEALTH ROUNDING Patient sleeping: Yes.   Patient alert and oriented: eyes closed  Appears asleep Behavior appropriate: Yes.  ; If no, describe:  Nutrition and fluids offered: Yes  Toileting and hygiene offered: sleeping Sitter present: q 15 minute observations  Law enforcement present: yes  [employer] 

## 2019-09-20 NOTE — ED Notes (Signed)
Hourly rounding reveals patient in room. No complaints, stable, in no acute distress. Q15 minute rounds and monitoring via Rover and Officer to continue.   

## 2019-09-20 NOTE — ED Notes (Signed)
BEHAVIORAL HEALTH ROUNDING Patient sleeping: No. Patient alert : yes Behavior appropriate: Yes.  ; If no, describe:  Nutrition and fluids offered: yes Toileting and hygiene offered: Yes  Sitter present: q15 minute observations Law enforcement present: Yes   [employer]  

## 2019-09-20 NOTE — ED Notes (Signed)
Breakfast tray provided for pt.  

## 2019-09-20 NOTE — ED Notes (Signed)
Patient observed lying in bed with eyes closed  Even, unlabored respirations observed   NAD pt appears to be sleeping  I will continue to monitor along with every 15 minute visual observations     

## 2019-09-20 NOTE — ED Notes (Addendum)
BEHAVIORAL HEALTH ROUNDING Patient sleeping: Yes.   Patient alert and oriented: eyes closed  Appears asleep Behavior appropriate: Yes.  ; If no, describe:  Nutrition and fluids offered: Yes  Toileting and hygiene offered: sleeping Sitter present: q 15 minute observations  Law enforcement present: yes  [employer]   ENVIRONMENTAL ASSESSMENT Potentially harmful objects out of patient reach: Yes.   Personal belongings secured: Yes.   Patient dressed in hospital provided attire only: Yes.   Plastic bags out of patient reach: Yes.   Patient care equipment (cords, cables, call bells, lines, and drains) shortened, removed, or accounted for: Yes.   Equipment and supplies removed from bottom of stretcher: Yes.   Potentially toxic materials out of patient reach: Yes.   Sharps container removed or out of patient reach: Yes.   

## 2019-09-20 NOTE — ED Provider Notes (Signed)
Patient scheduled to leave today at 1:30   Roberto Newport, MD 09/20/19 1030

## 2019-09-20 NOTE — ED Provider Notes (Signed)
-----------------------------------------   6:06 AM on 09/20/2019 -----------------------------------------   Blood pressure 126/86, pulse 82, temperature 97.9 F (36.6 C), temperature source Oral, resp. rate 16, height 1.803 m (5\' 11" ), weight 72.6 kg, SpO2 100 %.  The patient is calm and cooperative at this time.  There have been no acute events since the last update.  The patient is now in his 69th day in the Emergency Department.  Awaiting placement by Social Work team.   Hinda Kehr, MD 09/20/19 [phone]

## 2019-09-20 NOTE — TOC Progression Note (Signed)
Transition of Care [facility]) - Progression Note    Patient Details  Name: Roberto Vasquez MRN: [id number] Date of Birth: 1961-06-17  [facility] CM/SW [place], RN Phone Number: 09/20/2019, 10:32 AM  Clinical Narrative:    Notified by legal guardian, patient would be picked up by Mickel Fuchs, SW from [place] for discharge. RN CM supplied clean clothing for transfer and notified nurse.         Expected Discharge Plan and Services                                                 Social Determinants of Health (SDOH) Interventions    Readmission Risk Interventions No flowsheet data found.

## 2019-10-10 ENCOUNTER — Encounter: Payer: Self-pay | Admitting: *Deleted

## 2019-10-27 ENCOUNTER — Encounter: Payer: Self-pay | Admitting: Urology

## 2019-10-27 ENCOUNTER — Ambulatory Visit: Payer: Medicare Other | Admitting: Urology

## 2019-11-21 DEATH — deceased

## 2020-05-27 IMAGING — DX DG CHEST 1V PORT
1 series · 1 of 1 positions shown · non-contrast
Comparison: 05/14/2018

CLINICAL DATA: Weakness

EXAM:
PORTABLE CHEST 1 VIEW

[chest ap]
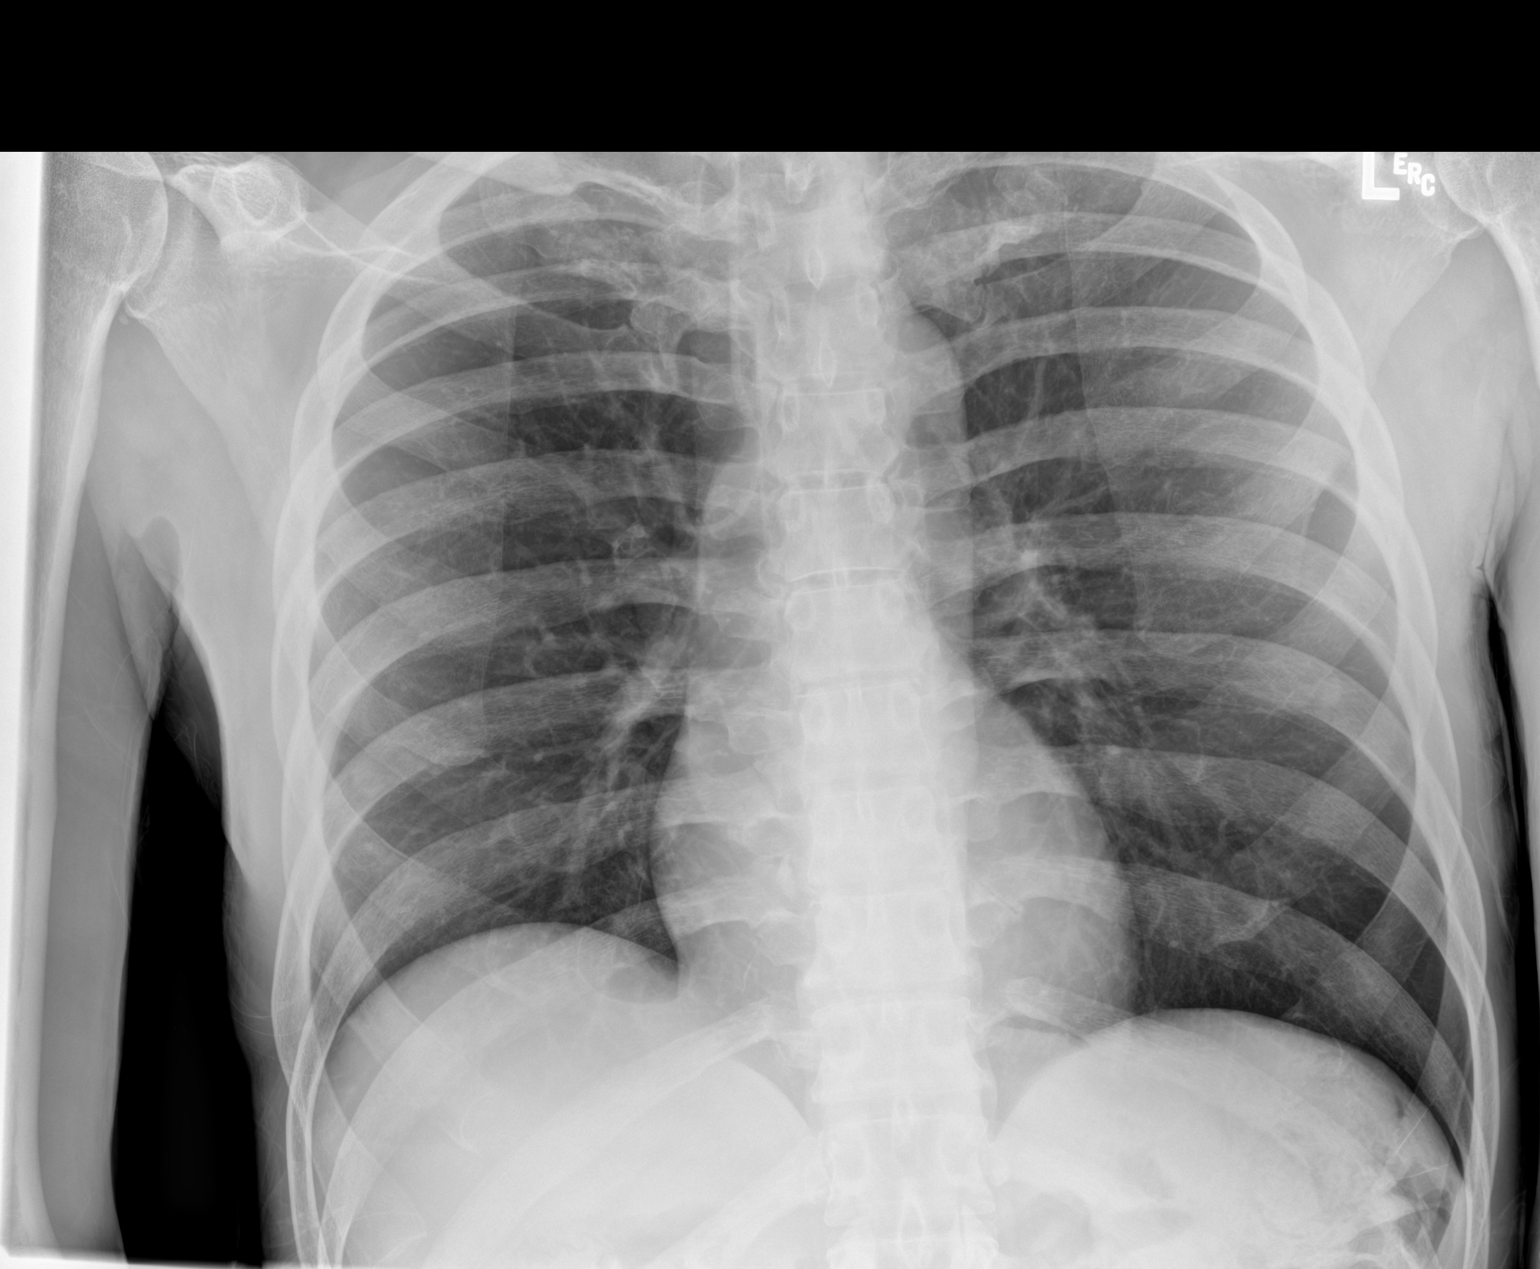

[1 of 1 positions shown; findings below may reference images not displayed]

FINDINGS: The heart size and mediastinal contours are within normal limits.
Both lungs are clear. The visualized skeletal structures are
unremarkable.
IMPRESSION: No active disease.
